# Patient Record
Sex: Female | Born: 1950
Health system: Southern US, Community
[De-identification: ages and names within clinical notes are randomized; demographics above are authoritative.]

## PROBLEM LIST (undated history)

## (undated) DIAGNOSIS — I219 Acute myocardial infarction, unspecified: Secondary | ICD-10-CM

## (undated) DIAGNOSIS — I2510 Atherosclerotic heart disease of native coronary artery without angina pectoris: Secondary | ICD-10-CM

## (undated) DIAGNOSIS — M549 Dorsalgia, unspecified: Secondary | ICD-10-CM

## (undated) DIAGNOSIS — I10 Essential (primary) hypertension: Secondary | ICD-10-CM

## (undated) DIAGNOSIS — I639 Cerebral infarction, unspecified: Secondary | ICD-10-CM

## (undated) DIAGNOSIS — I209 Angina pectoris, unspecified: Secondary | ICD-10-CM

## (undated) DIAGNOSIS — E119 Type 2 diabetes mellitus without complications: Secondary | ICD-10-CM

## (undated) DIAGNOSIS — Z720 Tobacco use: Secondary | ICD-10-CM

## (undated) DIAGNOSIS — M1990 Unspecified osteoarthritis, unspecified site: Secondary | ICD-10-CM

## (undated) DIAGNOSIS — G8929 Other chronic pain: Secondary | ICD-10-CM

## (undated) HISTORY — PX: APPENDECTOMY: SHX54

## (undated) HISTORY — PX: ANKLE SURGERY: SHX546

## (undated) HISTORY — PX: OTHER SURGICAL HISTORY: SHX169

## (undated) HISTORY — PX: ABDOMINAL HYSTERECTOMY: SHX81

---

## 2008-01-03 ENCOUNTER — Ambulatory Visit (HOSPITAL_COMMUNITY): Admission: RE | Admit: 2008-01-03 | Discharge: 2008-01-03 | Payer: Self-pay | Admitting: Neurosurgery

## 2013-09-08 DIAGNOSIS — I219 Acute myocardial infarction, unspecified: Secondary | ICD-10-CM

## 2013-09-08 HISTORY — DX: Acute myocardial infarction, unspecified: I21.9

## 2013-09-25 ENCOUNTER — Encounter (HOSPITAL_COMMUNITY): Payer: Self-pay | Admitting: Cardiovascular Disease

## 2013-09-25 ENCOUNTER — Encounter (HOSPITAL_COMMUNITY)
Admission: AD | Disposition: A | Discharge status: Home / Self Care | Payer: Medicare Other | Source: Other Acute Inpatient Hospital | Attending: Cardiovascular Disease

## 2013-09-25 ENCOUNTER — Inpatient Hospital Stay (HOSPITAL_COMMUNITY)
Admission: AD | Admit: 2013-09-25 | Discharge: 2013-09-26 | DRG: 247 | Disposition: A | Discharge status: Home / Self Care | Payer: Medicare Other | Source: Other Acute Inpatient Hospital | Attending: Cardiovascular Disease | Admitting: Cardiovascular Disease

## 2013-09-25 DIAGNOSIS — E119 Type 2 diabetes mellitus without complications: Secondary | ICD-10-CM | POA: Diagnosis present

## 2013-09-25 DIAGNOSIS — I2510 Atherosclerotic heart disease of native coronary artery without angina pectoris: Secondary | ICD-10-CM | POA: Diagnosis present

## 2013-09-25 DIAGNOSIS — I214 Non-ST elevation (NSTEMI) myocardial infarction: Secondary | ICD-10-CM

## 2013-09-25 DIAGNOSIS — Z720 Tobacco use: Secondary | ICD-10-CM | POA: Diagnosis present

## 2013-09-25 DIAGNOSIS — F17200 Nicotine dependence, unspecified, uncomplicated: Secondary | ICD-10-CM | POA: Diagnosis present

## 2013-09-25 DIAGNOSIS — I10 Essential (primary) hypertension: Secondary | ICD-10-CM | POA: Diagnosis present

## 2013-09-25 DIAGNOSIS — I209 Angina pectoris, unspecified: Secondary | ICD-10-CM

## 2013-09-25 DIAGNOSIS — Z955 Presence of coronary angioplasty implant and graft: Secondary | ICD-10-CM

## 2013-09-25 HISTORY — DX: Angina pectoris, unspecified: I20.9

## 2013-09-25 HISTORY — DX: Acute myocardial infarction, unspecified: I21.9

## 2013-09-25 HISTORY — DX: Unspecified osteoarthritis, unspecified site: M19.90

## 2013-09-25 HISTORY — DX: Atherosclerotic heart disease of native coronary artery without angina pectoris: I25.10

## 2013-09-25 HISTORY — DX: Type 2 diabetes mellitus without complications: E11.9

## 2013-09-25 HISTORY — PX: LEFT HEART CATHETERIZATION WITH CORONARY ANGIOGRAM: SHX5451

## 2013-09-25 HISTORY — PX: CORONARY ANGIOPLASTY: SHX604

## 2013-09-25 HISTORY — DX: Tobacco use: Z72.0

## 2013-09-25 HISTORY — DX: Essential (primary) hypertension: I10

## 2013-09-25 LAB — GLUCOSE, CAPILLARY
Glucose-Capillary: 103 mg/dL — ABNORMAL HIGH (ref 70–99)
Glucose-Capillary: 203 mg/dL — ABNORMAL HIGH (ref 70–99)

## 2013-09-25 LAB — POCT ACTIVATED CLOTTING TIME: Activated Clotting Time: 675 seconds

## 2013-09-25 LAB — MRSA PCR SCREENING: MRSA by PCR: NEGATIVE

## 2013-09-25 SURGERY — LEFT HEART CATHETERIZATION WITH CORONARY ANGIOGRAM
Anesthesia: LOCAL

## 2013-09-25 MED ORDER — HEPARIN (PORCINE) IN NACL 2-0.9 UNIT/ML-% IJ SOLN
INTRAMUSCULAR | Status: AC
  Filled 2013-09-25: qty 1000

## 2013-09-25 MED ORDER — SODIUM CHLORIDE 0.9 % IV SOLN: INTRAVENOUS | Status: AC

## 2013-09-25 MED ORDER — TICAGRELOR 90 MG PO TABS
90.0000 mg | ORAL_TABLET | Freq: Two times a day (BID) | ORAL | Status: DC
  Administered 2013-09-26: 90 mg via ORAL
  Filled 2013-09-25 (×2): qty 1

## 2013-09-25 MED ORDER — TICAGRELOR 90 MG PO TABS
ORAL_TABLET | ORAL | Status: AC
  Filled 2013-09-25: qty 2

## 2013-09-25 MED ORDER — ATORVASTATIN CALCIUM 80 MG PO TABS
80.0000 mg | ORAL_TABLET | Freq: Every day | ORAL | Status: DC
  Administered 2013-09-25: 80 mg via ORAL
  Filled 2013-09-25 (×2): qty 1

## 2013-09-25 MED ORDER — LIDOCAINE HCL (PF) 1 % IJ SOLN
INTRAMUSCULAR | Status: AC
  Filled 2013-09-25: qty 30

## 2013-09-25 MED ORDER — ASPIRIN 81 MG PO CHEW
81.0000 mg | CHEWABLE_TABLET | Freq: Every day | ORAL | Status: DC
  Administered 2013-09-26: 81 mg via ORAL
  Filled 2013-09-25: qty 1

## 2013-09-25 MED ORDER — FENTANYL CITRATE 0.05 MG/ML IJ SOLN
INTRAMUSCULAR | Status: AC
  Filled 2013-09-25: qty 2

## 2013-09-25 MED ORDER — NITROGLYCERIN 0.2 MG/ML ON CALL CATH LAB
INTRAVENOUS | Status: AC
  Filled 2013-09-25: qty 1

## 2013-09-25 MED ORDER — ACETAMINOPHEN 325 MG PO TABS
650.0000 mg | ORAL_TABLET | ORAL | Status: DC | PRN
  Administered 2013-09-25: 21:00:00 650 mg via ORAL
  Filled 2013-09-25: qty 2

## 2013-09-25 MED ORDER — METOPROLOL TARTRATE 25 MG PO TABS
25.0000 mg | ORAL_TABLET | Freq: Two times a day (BID) | ORAL | Status: DC
  Administered 2013-09-25 – 2013-09-26 (×2): 25 mg via ORAL
  Filled 2013-09-25 (×3): qty 1

## 2013-09-25 MED ORDER — MIDAZOLAM HCL 2 MG/2ML IJ SOLN
INTRAMUSCULAR | Status: AC
  Filled 2013-09-25: qty 2

## 2013-09-25 NOTE — H&P (Signed)
     Patient ID: Rachel Perkins MRN: [id number] DOB/AGE: 10-08-1950 63 y.o. Admit date: 09/25/2013  Primary Care Physician: Monico Blitz Primary Cardiologist: new  HPI: 63 yo female with history of HTN, tobacco abuse transferred from [facility] ED for further management of chest pain/NSTEMI. Chest pain began this am. Described as a heaviness in the center of her chest. Pain now relieved with NTG drip. She is on a heparin drip. Troponin over 1.0. D-dimer 1.1. CTA chest at [facility] with no evidence of PE. No prior cardiac disease. She has smoked for 50 years (1ppd) and continues to smoke. She has diet controlled DM.   Review of systems complete and found to be negative unless listed above   Past Medical History  Diagnosis Date  . HTN (hypertension)   . Tobacco abuse   . DM (diabetes mellitus)     Family History  Problem Relation Age of Onset  . Heart failure Father 72    History   Social History  . Marital Status: Married    Spouse Name: N/A    Number of Children: 2  . Years of Education: N/A   Occupational History  . Biochemist, clinical    Social History Main Topics  . Smoking status: Current Every Day Smoker -- 1.00 packs/day for 50 years    Types: Cigarettes  . Smokeless tobacco: Not on file  . Alcohol Use: No  . Drug Use: No  . Sexual Activity: Not on file   Other Topics Concern  . Not on file   Social History Narrative  . No narrative on file    Past Surgical History  Procedure Laterality Date  . Abdominal hysterectomy    . Appendectomy    . Skin grafts left arm    . Ankle surgery      Allergies: Nuts, codeine, PCN, sulfa  Prior to Admission Meds:  ASA 325 mg Qdaily HCTZ 25 mg po Qdaily  Physical Exam: 156/80  RR 12 P 83    General: Well developed, well nourished, NAD  HEENT: OP clear, mucus membranes moist  SKIN: warm, dry. No rashes.  Neuro: No focal deficits  Musculoskeletal: Muscle strength 5/5 all ext  Psychiatric: Mood and affect normal   Neck: No JVD, no carotid bruits, no thyromegaly, no lymphadenopathy.  Lungs:Clear bilaterally, no wheezes, rhonci, crackles  Cardiovascular: Regular rate and rhythm. No murmurs, gallops or rubs.  Abdomen:Soft. Bowel sounds present. Non-tender.  Extremities: No lower extremity edema. Pulses are 2 + in the bilateral DP/PT.  Labs: creatinine: 0.96. Na: 136, D-dimer: 1.14, Hbg 15.2, Plt: 217   Radiology: Chest x-ray: Hyperexpanded lungs, Mild emphysema.  EKG: Sinus, rate 85. No ischemic changes.  ASSESSMENT AND PLAN:   1. NSTEMI: Pt has a history of HTN and ongoing tobacco abuse. Class IV angina with elevated troponin. Recurrent chest pain today now resolved on IV NTG. Plan cardiac cath this afternoon with probable PCI.   2. Tobacco cessation: Smoking cessation advised.   3. DM: Diet controlled.   4. HTN: BP controlled.   Darlina Guys, MD 09/25/2013, 4:37 PM

## 2013-09-25 NOTE — CV Procedure (Signed)
     Cardiac Catheterization Operative Report  Rachel Perkins [id number] 5/18/20156:34 PM [facility], MD  Procedure Performed:  1. Left Heart Catheterization 2. Selective Coronary Angiography 3. Left ventricular angiogram 4. PTCA/DES x 1 mid Circumflex 5. PTCA/DES x 1 proximal RCA  Operator: Lauree Chandler, MD  Access: Right radial artery.   Indication: 63 yo female with history of DM, HTN, tobacco abuse admitted with NSTEMI.                                       Procedure Details: The risks, benefits, complications, treatment options, and expected outcomes were discussed with the patient. The patient and/or family concurred with the proposed plan, giving informed consent. The patient was brought to the cath lab after IV hydration was begun and oral premedication was given. The patient was further sedated with Versed and Fentanyl. The right wrist was prepped and draped in the usual manner. Using the modified Seldinger access technique, a 5/6 French sheath was placed in the right radial artery. 3 mg Verapamil was given through the sheath. 4000 Units IV heparin was given.  Standard diagnostic catheters were used to perform selective coronary angiography. A pigtail catheter was used to perform a left ventricular angiogram. She was found to have a severe stenosis in the proximal RCA (ulcerated plaque) and severe stenosis mid Circumflex. I elected to proceed to PCI of both the RCA and the Circumflex.   PCI Note: The patient was given an additional 5000 Units IV heparin. ACT was over 400. She was given Brilinta 180 mg po x 1.   Lesion #1 (mid Circumflex): Cougar IC wire down Circumflex. 2.0 x 12 mm balloon x 1. 2.25 x 12 mm Resolute DES x 1. Post-dilated with a 2.5 x 8 mm [place] balloon x 1. Stenosis taken from 95% down to 0%.   Lesion #2 (proximal RCA): Cougar IC wire down RCA. Direct stent placement with 3.0 x 15 mm Resolute Integrity DES. Post-dilated with a 3.5 x 12 mm [place] balloon x 1.  Stenosis from 80% down to 0%. Of note, this was an ulcerated plaque.   There were no immediate complications. The patient was taken to the recovery area in stable condition.   Hemodynamic Findings: Central aortic pressure: 129/63 Left ventricular pressure: 119/7/16  Angiographic Findings:  Left main: No obstructive disease.   Left Anterior Descending Artery: Large caliber vessel that courses to the apex. No obstructive disease. Moderate caliber diagonal branch with no obstructive disease.   Circumflex Artery: Large caliber vessel with three small to moderate caliber OM branches. Mid AV groove Circumflex with 95% stenosis. TIMI-2 flow into small caliber distal OM branch.   Right Coronary Artery: Large dominant vessel with 80% ulcerated proximal stenosis. 40% mid stenosis. Mild disease distal vessel.   Left Ventricular Angiogram: LVEF=60%.   Impression: 1. NSTEMI 2. Severe stenosis mid Circumflex now s/p successful PTCA/DES x 1 mid Circumflex 3. Severe stenosis proximal RCA (appears ulcerated) now s/p successful PTCA/DES x 1 proximal RCA 4. Normal LV function  Recommendations: ASA and Brilinta x 1 year. Start statin and beta blocker.        Complications:  None. The patient tolerated the procedure well.

## 2013-09-25 NOTE — Interval H&P Note (Signed)
History and Physical Interval Note:  09/25/2013 4:41 PM  Rachel Perkins  has presented today for surgery, with the diagnosis of CP  The various methods of treatment have been discussed with the patient and family. After consideration of risks, benefits and other options for treatment, the patient has consented to  Procedure(s): LEFT HEART CATHETERIZATION WITH CORONARY ANGIOGRAM (N/A) as a surgical intervention .  The patient's history has been reviewed, patient examined, no change in status, stable for surgery.  I have reviewed the patient's chart and labs.  Questions were answered to the patient's satisfaction.    Cath Lab Visit (complete for each Cath Lab visit)  Clinical Evaluation Leading to the Procedure:   ACS: yes  Non-ACS:    Anginal Classification: CCS IV  Anti-ischemic medical therapy: No Therapy  Non-Invasive Test Results: No non-invasive testing performed  Prior CABG: No previous CABG        Burnell Blanks

## 2013-09-26 ENCOUNTER — Encounter (HOSPITAL_COMMUNITY): Payer: Self-pay | Admitting: Physician Assistant

## 2013-09-26 LAB — BASIC METABOLIC PANEL
BUN: 15 mg/dL (ref 6–23)
CO2: 23 mEq/L (ref 19–32)
Calcium: 8.8 mg/dL (ref 8.4–10.5)
Chloride: 106 mEq/L (ref 96–112)
Creatinine, Ser: 0.83 mg/dL (ref 0.50–1.10)
GFR calc Af Amer: 84 mL/min — ABNORMAL LOW (ref >90)
GFR calc non Af Amer: 72 mL/min — ABNORMAL LOW (ref >90)
Glucose, Bld: 210 mg/dL — ABNORMAL HIGH (ref 70–99)
Potassium: 3.7 mEq/L (ref 3.7–5.3)
Sodium: 142 mEq/L (ref 137–147)

## 2013-09-26 LAB — GLUCOSE, CAPILLARY: Glucose-Capillary: 159 mg/dL — ABNORMAL HIGH (ref 70–99)

## 2013-09-26 LAB — CBC
HCT: 39.9 % (ref 36.0–46.0)
Hemoglobin: 13.8 g/dL (ref 12.0–15.0)
MCH: 31.5 pg (ref 26.0–34.0)
MCHC: 34.6 g/dL (ref 30.0–36.0)
MCV: 91.1 fL (ref 78.0–100.0)
Platelets: 222 10*3/uL (ref 150–400)
RBC: 4.38 MIL/uL (ref 3.87–5.11)
RDW: 13.8 % (ref 11.5–15.5)
WBC: 7.7 10*3/uL (ref 4.0–10.5)

## 2013-09-26 MED ORDER — NITROGLYCERIN 0.4 MG SL SUBL
0.4000 mg | SUBLINGUAL_TABLET | SUBLINGUAL | Status: DC | PRN
Start: 2013-09-26 — End: 2016-01-24

## 2013-09-26 MED ORDER — NICOTINE 7 MG/24HR TD PT24: 7.0000 mg | MEDICATED_PATCH | Freq: Every day | TRANSDERMAL | Status: DC

## 2013-09-26 MED ORDER — TICAGRELOR 90 MG PO TABS: 90.0000 mg | ORAL_TABLET | Freq: Two times a day (BID) | ORAL | Status: DC

## 2013-09-26 MED ORDER — NICOTINE 7 MG/24HR TD PT24
7.0000 mg | MEDICATED_PATCH | Freq: Every day | TRANSDERMAL | Status: DC
  Administered 2013-09-26: 10:00:00 7 mg via TRANSDERMAL
  Filled 2013-09-26: qty 1

## 2013-09-26 MED ORDER — ATORVASTATIN CALCIUM 80 MG PO TABS: 80.0000 mg | ORAL_TABLET | Freq: Every day | ORAL | Status: DC

## 2013-09-26 MED ORDER — METOPROLOL TARTRATE 25 MG PO TABS: 25.0000 mg | ORAL_TABLET | Freq: Two times a day (BID) | ORAL | Status: DC

## 2013-09-26 MED ORDER — NITROGLYCERIN 0.4 MG SL SUBL: 0.4000 mg | SUBLINGUAL_TABLET | SUBLINGUAL | Status: DC | PRN

## 2013-09-26 MED ORDER — ASPIRIN 81 MG PO CHEW: 81.0000 mg | CHEWABLE_TABLET | Freq: Every day | ORAL | Status: DC

## 2013-09-26 MED FILL — Nitroglycerin IV Soln 200 MCG/ML in D5W: INTRAVENOUS | Qty: 250 | Status: AC

## 2013-09-26 MED FILL — Heparin Sodium (Porcine) 100 Unt/ML in Sodium Chloride 0.45%: INTRAMUSCULAR | Qty: 250 | Status: AC

## 2013-09-26 NOTE — Progress Notes (Signed)
Patient Name: Rachel Perkins Date of Encounter: 09/26/2013     Active Problems:   NSTEMI (non-ST elevated myocardial infarction)   Coronary atherosclerosis of native coronary artery   HTN (hypertension)   Diabetes mellitus   Tobacco abuse    SUBJECTIVE  Will follow up in [place]. She wants nicotine patches for smoking cessation. No CP or SOB  CURRENT MEDS . aspirin  81 mg Oral Daily  . atorvastatin  80 mg Oral q1800  . metoprolol tartrate  25 mg Oral BID  . ticagrelor  90 mg Oral BID    OBJECTIVE  Filed Vitals:   09/25/13 2000 09/25/13 2100 09/26/13 0035 09/26/13 0631  BP: 120/56 126/61 113/52 117/75  Pulse: 72 74 75 82  Temp:  97.7 F (36.5 C) 98.3 F (36.8 C) 98.1 F (36.7 C)  TempSrc:  Oral Oral Oral  Resp:  18 18 20   Weight:   149 lb 11.1 oz (67.9 kg)   SpO2: 99% 99% 100% 100%    Intake/Output Summary (Last 24 hours) at 09/26/13 0651 Last data filed at 09/25/13 2300  Gross per 24 hour  Intake    300 ml  Output      0 ml  Net    300 ml   Filed Weights   09/26/13 0035  Weight: 149 lb 11.1 oz (67.9 kg)    PHYSICAL EXAM  General: Pleasant, NAD. Neuro: Alert and oriented X 3. Moves all extremities spontaneously. Psych: Normal affect. HEENT:  Normal  Neck: Supple without bruits or JVD. Lungs:  Resp regular and unlabored, CTA. Heart: RRR no s3, s4, or murmurs. Abdomen: Soft, non-tender, non-distended, BS + x 4.  Extremities: No clubbing, cyanosis or edema. DP/PT/Radials 2+ and equal bilaterally. No hematoma right wrist.  Accessory Clinical Findings  CBC  Recent Labs  09/26/13 0434  WBC 7.7  HGB 13.8  HCT 39.9  MCV 91.1  PLT 347   Basic Metabolic Panel  Recent Labs  09/26/13 0434  NA 142  K 3.7  CL 106  CO2 23  GLUCOSE 210*  BUN 15  CREATININE 0.83  CALCIUM 8.8   TELE  NSR with PVCs  Radiology/Studies  Chest x-ray: Hyperexpanded lungs, Mild emphysema.   ASSESSMENT AND PLAN  Rachel Perkins is a 63 y.o. female with a  history of diet controlled DM, HTN and tobacco abuse who was transferred from [facility] ED on 09/25/13 for further management of chest pain/NSTEMI. She was taken for cardiac cath that day.   NSTEMI- troponin >1 at [facility]. -- LHC w/ severe stenosis mid Circumflex now s/p successful PTCA/DES to mLCx, severe stenosis proximal RCA (appears ulcerated) now s/p successful PTCA/DES pRCA  -- She had normal LV function EF 60%.  -- Continue on ASA and Brilinta x 1 year as well as statin and beta blocker  Tobacco abuse- wants to quit. nicotine patches at discharge.   DM: Diet controlled.   D-dimer 1.1- CTA chest at [facility] with no evidence of PE  HTN: BP controlled   Signed, Perry Mount PA-C  Pager [phone]  Patient seen and examined and history reviewed. Agree with above findings and plan. Feels very well. No recurrent chest pain. Right wrist without hematoma. Labs stable. Ecg shows mild T wave inversion V3-4. Plan DC home today. She does a lot of lifting at work so I have recommended she stay out of work until cleared to return on her follow up visit. Plan follow up in our [place] office. Smoking  cessation encouraged and pt plans to stop with help of nicotine patch. Stressed importance of compliance with DAPT for one year.   Ander Slade [facility] 09/26/2013 7:28 AM

## 2013-09-26 NOTE — Discharge Summary (Signed)
Discharge Summary   Patient ID: Rachel Perkins MRN: [id number], DOB/AGE: 1950/08/31 63 y.o. Admit date: 09/25/2013 D/C date:     09/26/2013  Primary Cardiologist: New, will follow in [place] with Dr. Bronson Ing  Principal Problem:   NSTEMI (non-ST elevated myocardial infarction) Active Problems:   Coronary atherosclerosis of native coronary artery   HTN (hypertension)   Diabetes mellitus   Tobacco abuse   Admission Dates: 09/25/13-09/26/13 Discharge Diagnosis: NSTEMI s/p PCI with DES to [facility] and DES to pRCA on 09/25/13  HPI: Rachel Perkins is a 63 y.o. female with a history of diet controlled DM, HTN and ongoing tobacco abuse who was transferred from [facility] ED on 09/25/13 for further management of chest pain/NSTEMI. She was taken for cardiac cath at [facility] that day.   Hospital Course  NSTEMI- troponin >1 at [facility].  -- LHC on 09/25/13 w/ severe stenosis mid Circumflex, now s/p successful PTCA/DES to mLCx and severe stenosis of proximal RCA (appears ulcerated) now s/p successful PTCA/DES pRCA. She had normal LV function EF 60%. She will continue on ASA and Brilinta x 1 year as well as statin and beta blocker.  Tobacco abuse- She has smoked for 50 years (1ppd) and is now determined to quit. She requested nicotine patches at discharge.   D-dimer 1.1- CTA chest at [facility] with no evidence of PE.  HTN: BP controlled   DM: Diet controlled.   The patient has had an uncomplicated hospital course and is recovering well. The radial catheter site is stable. She has been seen by Dr. Martinique today and deemed ready for discharge home. All follow-up appointments have been scheduled. Smoking cessation was disscussed in length and she was provided with nicotine patches. A 30 day free supply of Brilinta was provided for the patient. She does a lot of lifting at work so she was advised to stay out of work until cleared to return on her follow up visit. A work excuse note was provided as well.  Discharge medications include ASA/Brilinta, metoprolol tartrate 25mg  BID, atorvastatin 80mg , HCTZ 25mg , SL NTG. She has been advised to discontinue her Celebrex for now.    Discharge Vitals: Blood pressure 112/70, pulse 70, temperature 98.2 F (36.8 C), temperature source Oral, resp. rate 22, weight 149 lb 11.1 oz (67.9 kg), SpO2 98.00%.  Labs: Lab Results  Component Value Date   WBC 7.7 09/26/2013   HGB 13.8 09/26/2013   HCT 39.9 09/26/2013   MCV 91.1 09/26/2013   PLT 222 09/26/2013     Recent Labs Lab 09/26/13 0434  NA 142  K 3.7  CL 106  CO2 23  BUN 15  CREATININE 0.83  CALCIUM 8.8  GLUCOSE 210*    Diagnostic Studies/Procedures   Chest x-ray: Hyperexpanded lungs, Mild emphysema.   Cardiac Catheterization Operative Report  5/18/20156:34 PM  Procedure Performed:  1. Left Heart Catheterization 2. Selective Coronary Angiography 3. Left ventricular angiogram 4. PTCA/DES x 1 mid Circumflex 5. PTCA/DES x 1 proximal RCA Operator: Lauree Chandler, MD  Access: Right radial artery.  Indication: 63 yo female with history of DM, HTN, tobacco abuse admitted with NSTEMI.  Procedure Details:  The risks, benefits, complications, treatment options, and expected outcomes were discussed with the patient. The patient and/or family concurred with the proposed plan, giving informed consent. The patient was brought to the cath lab after IV hydration was begun and oral premedication was given. The patient was further sedated with Versed and Fentanyl. The right wrist was prepped  and draped in the usual manner. Using the modified Seldinger access technique, a 5/6 French sheath was placed in the right radial artery. 3 mg Verapamil was given through the sheath. 4000 Units IV heparin was given. Standard diagnostic catheters were used to perform selective coronary angiography. A pigtail catheter was used to perform a left ventricular angiogram. She was found to have a severe stenosis in the  proximal RCA (ulcerated plaque) and severe stenosis mid Circumflex. I elected to proceed to PCI of both the RCA and the Circumflex.  PCI Note: The patient was given an additional 5000 Units IV heparin. ACT was over 400. She was given Brilinta 180 mg po x 1.  Lesion #1 (mid Circumflex): Cougar IC wire down Circumflex. 2.0 x 12 mm balloon x 1. 2.25 x 12 mm Resolute DES x 1. Post-dilated with a 2.5 x 8 mm [place] balloon x 1. Stenosis taken from 95% down to 0%.  Lesion #2 (proximal RCA): Cougar IC wire down RCA. Direct stent placement with 3.0 x 15 mm Resolute Integrity DES. Post-dilated with a 3.5 x 12 mm [place] balloon x 1. Stenosis from 80% down to 0%. Of note, this was an ulcerated plaque.  There were no immediate complications. The patient was taken to the recovery area in stable condition.  Hemodynamic Findings:  Central aortic pressure: 129/63  Left ventricular pressure: 119/7/16  Angiographic Findings:  Left main: No obstructive disease.  Left Anterior Descending Artery: Large caliber vessel that courses to the apex. No obstructive disease. Moderate caliber diagonal branch with no obstructive disease.  Circumflex Artery: Large caliber vessel with three small to moderate caliber OM branches. Mid AV groove Circumflex with 95% stenosis. TIMI-2 flow into small caliber distal OM branch.  Right Coronary Artery: Large dominant vessel with 80% ulcerated proximal stenosis. 40% mid stenosis. Mild disease distal vessel.  Left Ventricular Angiogram: LVEF=60%.  Impression:  1. NSTEMI  2. Severe stenosis mid Circumflex now s/p successful PTCA/DES x 1 mid Circumflex  3. Severe stenosis proximal RCA (appears ulcerated) now s/p successful PTCA/DES x 1 proximal RCA  4. Normal LV function  Recommendations: ASA and Brilinta x 1 year. Start statin and beta blocker.  Complications: None. The patient tolerated the procedure well.    Discharge Medications     Medication List    STOP taking these medications        celecoxib 100 MG capsule  Commonly known as:  CELEBREX      TAKE these medications       aspirin 81 MG chewable tablet  Chew 1 tablet (81 mg total) by mouth daily.     atorvastatin 80 MG tablet  Commonly known as:  LIPITOR  Take 1 tablet (80 mg total) by mouth daily at 6 PM.     EPIPEN 2-PAK 0.3 mg/0.3 mL Soaj injection  Generic drug:  EPINEPHrine  Inject 0.3 mg into the muscle once.     hydrochlorothiazide 25 MG tablet  Commonly known as:  HYDRODIURIL  Take 25 mg by mouth daily.     loratadine 10 MG tablet  Commonly known as:  CLARITIN  Take 10 mg by mouth daily.     metoprolol tartrate 25 MG tablet  Commonly known as:  LOPRESSOR  Take 1 tablet (25 mg total) by mouth 2 (two) times daily.     mometasone 50 MCG/ACT nasal spray  Commonly known as:  NASONEX  Place 1 spray into the nose daily as needed (allergies).     nicotine 7  mg/24hr patch  Commonly known as:  NICODERM CQ - dosed in mg/24 hr  Place 1 patch (7 mg total) onto the skin daily.     ticagrelor 90 MG Tabs tablet  Commonly known as:  BRILINTA  Take 1 tablet (90 mg total) by mouth 2 (two) times daily.        Disposition   The patient will be discharged in stable condition to home.  Follow-up Information   Follow up with Herminio Commons, MD On 10/03/2013. (@ 9:40 am)    Specialty:  Cardiology   Contact information:   [address] [place] [address] [phone]         Duration of Discharge Encounter: Greater than 30 minutes including physician and PA time.  Signed, Perry Mount PA-C 09/26/2013, 8:05 AM

## 2013-09-26 NOTE — Discharge Instructions (Signed)

## 2013-09-26 NOTE — Care Management Note (Signed)
    Page 1 of 1   09/26/2013     11:46:57 AM CARE MANAGEMENT NOTE 09/26/2013  Patient:  Rachel Perkins, Rachel Perkins   Account Number:  [id number]  Date Initiated:  09/26/2013  Documentation initiated by:  Fuller Mandril  Subjective/Objective Assessment:   63 yo female with history of HTN, tobacco abuse transferred from [facility] ED for further management of chest pain/NSTEMI.// Home with spouse.     Action/Plan:   Procedure Performed:  Left Heart Catheterization  Selective Coronary Angiography  Left ventricular angiogram  PTCA/DES x 1 mid Circumflex  PTCA/DES x 1 proximal RCA.// Benefits check for Brilinta   Anticipated DC Date:  09/26/2013   Anticipated DC Plan:  [place]  CM consult  Medication Assistance      Choice offered to / List presented to:             Status of service:  Completed, signed off Medicare Important Message given?   (If response is "NO", the following Medicare IM given date fields will be blank) Date Medicare IM given:   Date Additional Medicare IM given:    Discharge Disposition:    Per UR Regulation:  Reviewed for med. necessity/level of care/duration of stay  If discussed at [place] of Stay Meetings, dates discussed:    Comments:  09/26/13 0900 Lyanna Blystone J. Clydene Laming, RN, BSN, [place] [phone] RN spoke with pt at bedside regarding benefits check for Brilinta.  Pt has brochure with 30 day free card and refill assistance card intact.  Pt utilizes [facility] in [place], [place] for prescription needs.  NCM and RN called pharmacy to confirm availability of medication. Information relayed to pt.  Pt verbalizes importance of filling medication upon discharge.

## 2013-09-26 NOTE — Discharge Summary (Signed)
Patient seen and examined and history reviewed. Agree with above findings and plan. See my earlier rounding note.  Ander Slade [facility] 09/26/2013 8:17 AM

## 2013-09-26 NOTE — Progress Notes (Signed)
CARDIAC REHAB PHASE I   PRE:  Rate/Rhythm: 78 SR  BP:  Supine:   Sitting: 124/74  Standing:    SaO2:   MODE:  Ambulation: 1000 ft   POST:  Rate/Rhythm: 83 SR  BP:  Supine:   Sitting: 120/70  Standing:    SaO2:  0750-0850 Pt walked 1000 ft with fast pace. No CP but slight soreness. Education completed with pt and family. Understanding voiced. Discussed smoking cessation and handouts given. Encouraged pt to call 1800quitnow as needed. Pt is going to try nicotine patches. Discussed CRP 2 and pt gave permission to refer to [facility] Phase 2. Gave diabetic and heart healthy diets.    Graylon Good, RN BSN  09/26/2013 8:47 AM

## 2013-09-27 ENCOUNTER — Telehealth: Payer: Self-pay | Admitting: *Deleted

## 2013-09-27 NOTE — Telephone Encounter (Signed)
Husband request something to help wife sleep.  Advised that she may try OTC Benadryl 25mg  at bedtime.  If need anything stronger, should contact PMD as our providers do not typically prescribe these types of medications.  Post hospital visit scheduled for 10/03/2013 with Dr. Bronson Ing.  Husband verbalized understanding.

## 2013-10-03 ENCOUNTER — Encounter: Payer: Self-pay | Admitting: *Deleted

## 2013-10-03 ENCOUNTER — Ambulatory Visit (INDEPENDENT_AMBULATORY_CARE_PROVIDER_SITE_OTHER): Payer: Medicare Other | Admitting: Cardiovascular Disease

## 2013-10-03 ENCOUNTER — Encounter: Payer: Self-pay | Admitting: Cardiovascular Disease

## 2013-10-03 VITALS — BP 111/75 | HR 66 | Ht 63.0 in | Wt 147.0 lb

## 2013-10-03 DIAGNOSIS — Z7182 Exercise counseling: Secondary | ICD-10-CM

## 2013-10-03 DIAGNOSIS — I214 Non-ST elevation (NSTEMI) myocardial infarction: Secondary | ICD-10-CM

## 2013-10-03 DIAGNOSIS — E785 Hyperlipidemia, unspecified: Secondary | ICD-10-CM

## 2013-10-03 DIAGNOSIS — Z9861 Coronary angioplasty status: Secondary | ICD-10-CM

## 2013-10-03 DIAGNOSIS — Z955 Presence of coronary angioplasty implant and graft: Secondary | ICD-10-CM

## 2013-10-03 DIAGNOSIS — I10 Essential (primary) hypertension: Secondary | ICD-10-CM

## 2013-10-03 DIAGNOSIS — I2510 Atherosclerotic heart disease of native coronary artery without angina pectoris: Secondary | ICD-10-CM

## 2013-10-03 NOTE — Patient Instructions (Signed)
   Referral to Cardiac Rehab Continue all current medications. Work note provided  Follow up in  3-4 months

## 2013-10-03 NOTE — Progress Notes (Signed)
Patient ID: Rachel Perkins, female   DOB: Aug 27, 1950, 63 y.o.   MRN: [id number]      SUBJECTIVE: The patient is a 63 year old woman with a recent non-STEMI for which she underwent drug-eluting stent placement to the mid left circumflex and proximal right coronary arteries. Left ventriculography demonstrated normal left ventricular systolic function. She has a history of diet controlled diabetes mellitus, hypertension, and tobacco abuse. She is now taking Brilinta. She has since quit smoking. She has had some mild chest and left shoulder pains but it has been nothing like what she experienced prior to her MI. She does feel slightly more sluggish than she used to. She would like to start working again. She works part-time at [employer] in 4 or 5 hour shifts. She used to lift up to 45 pounds at work but says she will no longer do this. She says she has been compliant with all her medications.    Allergies  Allergen Reactions  . Codeine Itching  . Other     NUTS ?LOBSTER  . Penicillins Rash  . Sulfa Antibiotics Rash    Current Outpatient Prescriptions  Medication Sig Dispense Refill  . aspirin 81 MG chewable tablet Chew 1 tablet (81 mg total) by mouth daily.      Marland Kitchen atorvastatin (LIPITOR) 80 MG tablet Take 1 tablet (80 mg total) by mouth daily at 6 PM.  30 tablet  11  . clonazePAM (KLONOPIN) 0.5 MG tablet Take 1 tablet by mouth at bedtime.      Marland Kitchen EPIPEN 2-PAK 0.3 MG/0.3ML SOAJ injection Inject 0.3 mg into the muscle once.       . hydrochlorothiazide (HYDRODIURIL) 25 MG tablet Take 25 mg by mouth daily.       . metoprolol tartrate (LOPRESSOR) 25 MG tablet Take 1 tablet (25 mg total) by mouth 2 (two) times daily.  60 tablet  11  . nicotine (NICODERM CQ - DOSED IN MG/24 HR) 7 mg/24hr patch Place 1 patch (7 mg total) onto the skin daily.  28 patch  0  . nitroGLYCERIN (NITROSTAT) 0.4 MG SL tablet Place 1 tablet (0.4 mg total) under the tongue every 5 (five) minutes as needed for chest pain.  25  tablet  12  . ticagrelor (BRILINTA) 90 MG TABS tablet Take 1 tablet (90 mg total) by mouth 2 (two) times daily.  60 tablet  11   No current facility-administered medications for this visit.    Past Medical History  Diagnosis Date  . HTN (hypertension)   . Tobacco abuse   . DM (diabetes mellitus)     a. diet controlled.   Marland Kitchen CAD (coronary artery disease)     a. 09/25/13 s/p DES to [facility] and DES to pRCA.  Marland Kitchen Myocardial infarction 09/2013    nstemi  . Anginal pain   . Arthritis     Past Surgical History  Procedure Laterality Date  . Abdominal hysterectomy    . Appendectomy    . Skin grafts left arm    . Ankle surgery    . Coronary angioplasty  09/25/2013    des mid circumflex  des rca    History   Social History  . Marital Status: Married    Spouse Name: N/A    Number of Children: 2  . Years of Education: N/A   Occupational History  . Biochemist, clinical    Social History Main Topics  . Smoking status: Current Every Day Smoker -- 1.00 packs/day for 50 years  Types: Cigarettes    Start date: 06/04/1964  . Smokeless tobacco: Never Used     Comment: currently using the nicotine patch/last time she smoked was last Monday 09/25/13  . Alcohol Use: No  . Drug Use: No  . Sexual Activity: Not on file   Other Topics Concern  . Not on file   Social History Narrative  . No narrative on file     Filed Vitals:   10/03/13 0933  BP: 111/75  Pulse: 66  Height: 5\' 3"  (1.6 m)  Weight: 147 lb (66.679 kg)    PHYSICAL EXAM General: NAD Neck: No JVD, no thyromegaly. Lungs: Clear to auscultation bilaterally with normal respiratory effort. CV: Nondisplaced PMI.  Regular rate and rhythm, normal S1/S2, no S3/S4, no murmur. No pretibial or periankle edema.  No carotid bruit.  Normal pedal pulses.  Abdomen: Soft, nontender, no hepatosplenomegaly, no distention.  Neurologic: Alert and oriented x 3.  Psych: Normal affect. Extremities: No clubbing or cyanosis.   ECG: reviewed and  available in electronic records.      ASSESSMENT AND PLAN: 1. CAD s/p NSTEMI with DES to mid LCx and prox RCA: Symptomatically stable. Continue aspirin, Lipitor, metoprolol, and Brilinta. I will make a referral to cardiac rate dilatation and strongly encouraged her to participate in this. I will allow her to return to work with no heavy lifting. 2. HTN: Controlled on current therapy which includes HCTZ and metoprolol. 3. Tobacco abuse: She has quit smoking altogether and I encouraged continued cessation. 4. Hyperlipidemia: I will check to see if lipids were checked at [facility].  Dispo: f/u 3-4 months.  Kate Sable, M.D., F.A.C.C.

## 2013-11-06 ENCOUNTER — Encounter (HOSPITAL_COMMUNITY)
Admission: RE | Admit: 2013-11-06 | Discharge: 2013-11-06 | Disposition: A | Discharge status: Home / Self Care | Payer: Medicare Other | Source: Ambulatory Visit | Attending: Cardiovascular Disease | Admitting: Cardiovascular Disease

## 2013-11-06 VITALS — BP 90/70 | HR 73 | Ht 63.0 in | Wt 148.0 lb

## 2013-11-06 DIAGNOSIS — I214 Non-ST elevation (NSTEMI) myocardial infarction: Secondary | ICD-10-CM

## 2013-11-06 DIAGNOSIS — Z9861 Coronary angioplasty status: Secondary | ICD-10-CM

## 2013-11-06 NOTE — Progress Notes (Signed)
Patient referred to CR by Dr. Bronson Ing due to NSTEMI 410.70 and Stent Placement 414.01. During orientation advised patient on arrival and appointment times what to wear, what to do before, during and after exercise. Reviewed attendance and class policy. Talked about inclement weather and class consultation policy. Pt is scheduled to start Cardiac Rehab on 11/13/13 at 2pm. Pt was advised to come to class 5 minutes before class starts. He was also given instructions on meeting with the dietician and attending the Family Structure classes. Pt is eager to get started.

## 2013-11-06 NOTE — Patient Instructions (Signed)
Pt has finished orientation and is scheduled to start CR on 11/13/2013 at 2 pm. Pt has been instructed to arrive to class 15 minutes early for scheduled class. Pt has been instructed to wear comfortable clothing and shoes with rubber soles. Pt has been told to take their medications 1 hour prior to coming to class.  If the patient is not going to attend class, he/she has been instructed to call.

## 2013-11-13 ENCOUNTER — Encounter (HOSPITAL_COMMUNITY)
Admission: RE | Admit: 2013-11-13 | Discharge: 2013-11-13 | Disposition: A | Discharge status: Home / Self Care | Payer: Medicare Other | Source: Ambulatory Visit | Attending: Cardiovascular Disease | Admitting: Cardiovascular Disease

## 2013-11-13 DIAGNOSIS — I214 Non-ST elevation (NSTEMI) myocardial infarction: Secondary | ICD-10-CM | POA: Diagnosis not present

## 2013-11-13 DIAGNOSIS — Z9861 Coronary angioplasty status: Secondary | ICD-10-CM | POA: Insufficient documentation

## 2013-11-15 ENCOUNTER — Encounter (HOSPITAL_COMMUNITY)
Admission: RE | Admit: 2013-11-15 | Discharge: 2013-11-15 | Disposition: A | Discharge status: Home / Self Care | Payer: Medicare Other | Source: Ambulatory Visit | Attending: Cardiovascular Disease | Admitting: Cardiovascular Disease

## 2013-11-15 DIAGNOSIS — I214 Non-ST elevation (NSTEMI) myocardial infarction: Secondary | ICD-10-CM | POA: Diagnosis not present

## 2013-11-17 ENCOUNTER — Encounter (HOSPITAL_COMMUNITY)
Admission: RE | Admit: 2013-11-17 | Discharge: 2013-11-17 | Disposition: A | Discharge status: Home / Self Care | Payer: Medicare Other | Source: Ambulatory Visit | Attending: Cardiovascular Disease | Admitting: Cardiovascular Disease

## 2013-11-17 DIAGNOSIS — I214 Non-ST elevation (NSTEMI) myocardial infarction: Secondary | ICD-10-CM | POA: Diagnosis not present

## 2013-11-20 ENCOUNTER — Encounter (HOSPITAL_COMMUNITY)
Admission: RE | Admit: 2013-11-20 | Discharge: 2013-11-20 | Disposition: A | Discharge status: Home / Self Care | Payer: Medicare Other | Source: Ambulatory Visit | Attending: Cardiovascular Disease | Admitting: Cardiovascular Disease

## 2013-11-20 DIAGNOSIS — I214 Non-ST elevation (NSTEMI) myocardial infarction: Secondary | ICD-10-CM | POA: Diagnosis not present

## 2013-11-22 ENCOUNTER — Encounter (HOSPITAL_COMMUNITY)
Admission: RE | Admit: 2013-11-22 | Discharge: 2013-11-22 | Disposition: A | Discharge status: Home / Self Care | Payer: Medicare Other | Source: Ambulatory Visit | Attending: Cardiovascular Disease | Admitting: Cardiovascular Disease

## 2013-11-22 DIAGNOSIS — I214 Non-ST elevation (NSTEMI) myocardial infarction: Secondary | ICD-10-CM | POA: Diagnosis not present

## 2013-11-24 ENCOUNTER — Encounter (HOSPITAL_COMMUNITY): Payer: Medicare Other

## 2013-11-27 ENCOUNTER — Encounter (HOSPITAL_COMMUNITY)
Admission: RE | Admit: 2013-11-27 | Discharge: 2013-11-27 | Disposition: A | Discharge status: Home / Self Care | Payer: Medicare Other | Source: Ambulatory Visit | Attending: Cardiovascular Disease | Admitting: Cardiovascular Disease

## 2013-11-27 DIAGNOSIS — I214 Non-ST elevation (NSTEMI) myocardial infarction: Secondary | ICD-10-CM | POA: Diagnosis not present

## 2013-11-29 ENCOUNTER — Encounter (HOSPITAL_COMMUNITY)
Admission: RE | Admit: 2013-11-29 | Discharge: 2013-11-29 | Disposition: A | Discharge status: Home / Self Care | Payer: Medicare Other | Source: Ambulatory Visit | Attending: Cardiovascular Disease | Admitting: Cardiovascular Disease

## 2013-11-29 DIAGNOSIS — I214 Non-ST elevation (NSTEMI) myocardial infarction: Secondary | ICD-10-CM | POA: Diagnosis not present

## 2013-12-01 ENCOUNTER — Encounter (HOSPITAL_COMMUNITY): Payer: Medicare Other

## 2013-12-04 ENCOUNTER — Encounter (HOSPITAL_COMMUNITY)
Admission: RE | Admit: 2013-12-04 | Discharge: 2013-12-04 | Disposition: A | Discharge status: Home / Self Care | Payer: Medicare Other | Source: Ambulatory Visit | Attending: Cardiovascular Disease | Admitting: Cardiovascular Disease

## 2013-12-04 DIAGNOSIS — I214 Non-ST elevation (NSTEMI) myocardial infarction: Secondary | ICD-10-CM | POA: Diagnosis not present

## 2013-12-06 ENCOUNTER — Encounter (HOSPITAL_COMMUNITY): Payer: Medicare Other

## 2013-12-08 ENCOUNTER — Encounter (HOSPITAL_COMMUNITY): Payer: Medicare Other

## 2013-12-11 ENCOUNTER — Encounter (HOSPITAL_COMMUNITY)
Admission: RE | Admit: 2013-12-11 | Discharge: 2013-12-11 | Disposition: A | Discharge status: Home / Self Care | Payer: Medicare Other | Source: Ambulatory Visit | Attending: Cardiovascular Disease | Admitting: Cardiovascular Disease

## 2013-12-11 DIAGNOSIS — I2510 Atherosclerotic heart disease of native coronary artery without angina pectoris: Secondary | ICD-10-CM | POA: Insufficient documentation

## 2013-12-11 DIAGNOSIS — I10 Essential (primary) hypertension: Secondary | ICD-10-CM | POA: Diagnosis not present

## 2013-12-11 DIAGNOSIS — Z7902 Long term (current) use of antithrombotics/antiplatelets: Secondary | ICD-10-CM | POA: Diagnosis not present

## 2013-12-11 DIAGNOSIS — F17200 Nicotine dependence, unspecified, uncomplicated: Secondary | ICD-10-CM | POA: Diagnosis not present

## 2013-12-11 DIAGNOSIS — I214 Non-ST elevation (NSTEMI) myocardial infarction: Secondary | ICD-10-CM | POA: Diagnosis not present

## 2013-12-11 DIAGNOSIS — E119 Type 2 diabetes mellitus without complications: Secondary | ICD-10-CM | POA: Diagnosis not present

## 2013-12-11 DIAGNOSIS — Z7982 Long term (current) use of aspirin: Secondary | ICD-10-CM | POA: Insufficient documentation

## 2013-12-11 DIAGNOSIS — Z9861 Coronary angioplasty status: Secondary | ICD-10-CM | POA: Diagnosis not present

## 2013-12-13 ENCOUNTER — Encounter (HOSPITAL_COMMUNITY)
Admission: RE | Admit: 2013-12-13 | Discharge: 2013-12-13 | Disposition: A | Discharge status: Home / Self Care | Payer: Medicare Other | Source: Ambulatory Visit | Attending: Cardiovascular Disease | Admitting: Cardiovascular Disease

## 2013-12-13 DIAGNOSIS — I214 Non-ST elevation (NSTEMI) myocardial infarction: Secondary | ICD-10-CM | POA: Diagnosis not present

## 2013-12-15 ENCOUNTER — Encounter (HOSPITAL_COMMUNITY): Payer: Medicare Other

## 2013-12-18 ENCOUNTER — Encounter (HOSPITAL_COMMUNITY): Payer: Medicare Other

## 2013-12-20 ENCOUNTER — Encounter (HOSPITAL_COMMUNITY): Payer: Medicare Other

## 2013-12-22 ENCOUNTER — Encounter (HOSPITAL_COMMUNITY): Payer: Medicare Other

## 2013-12-25 ENCOUNTER — Encounter (HOSPITAL_COMMUNITY)
Admission: RE | Admit: 2013-12-25 | Discharge: 2013-12-25 | Disposition: A | Discharge status: Home / Self Care | Payer: Medicare Other | Source: Ambulatory Visit | Attending: Cardiovascular Disease | Admitting: Cardiovascular Disease

## 2013-12-25 DIAGNOSIS — I214 Non-ST elevation (NSTEMI) myocardial infarction: Secondary | ICD-10-CM | POA: Diagnosis not present

## 2013-12-27 ENCOUNTER — Encounter (HOSPITAL_COMMUNITY)
Admission: RE | Admit: 2013-12-27 | Discharge: 2013-12-27 | Disposition: A | Discharge status: Home / Self Care | Payer: Medicare Other | Source: Ambulatory Visit | Attending: Cardiovascular Disease | Admitting: Cardiovascular Disease

## 2013-12-27 DIAGNOSIS — I214 Non-ST elevation (NSTEMI) myocardial infarction: Secondary | ICD-10-CM | POA: Diagnosis not present

## 2013-12-29 ENCOUNTER — Encounter (HOSPITAL_COMMUNITY): Payer: Medicare Other

## 2014-01-01 ENCOUNTER — Encounter (HOSPITAL_COMMUNITY)
Admission: RE | Admit: 2014-01-01 | Discharge: 2014-01-01 | Disposition: A | Discharge status: Home / Self Care | Payer: Medicare Other | Source: Ambulatory Visit | Attending: Cardiovascular Disease | Admitting: Cardiovascular Disease

## 2014-01-01 DIAGNOSIS — I214 Non-ST elevation (NSTEMI) myocardial infarction: Secondary | ICD-10-CM | POA: Diagnosis not present

## 2014-01-03 ENCOUNTER — Encounter (HOSPITAL_COMMUNITY): Payer: Medicare Other

## 2014-01-05 ENCOUNTER — Encounter (HOSPITAL_COMMUNITY): Payer: Medicare Other

## 2014-01-08 ENCOUNTER — Encounter (HOSPITAL_COMMUNITY)
Admission: RE | Admit: 2014-01-08 | Discharge: 2014-01-08 | Disposition: A | Discharge status: Home / Self Care | Payer: Medicare Other | Source: Ambulatory Visit | Attending: Cardiovascular Disease | Admitting: Cardiovascular Disease

## 2014-01-08 DIAGNOSIS — I214 Non-ST elevation (NSTEMI) myocardial infarction: Secondary | ICD-10-CM | POA: Diagnosis not present

## 2014-01-10 ENCOUNTER — Encounter (HOSPITAL_COMMUNITY): Payer: Medicare Other

## 2014-01-11 ENCOUNTER — Ambulatory Visit (INDEPENDENT_AMBULATORY_CARE_PROVIDER_SITE_OTHER): Payer: Medicare Other | Admitting: Cardiovascular Disease

## 2014-01-11 ENCOUNTER — Encounter: Payer: Self-pay | Admitting: *Deleted

## 2014-01-11 ENCOUNTER — Encounter: Payer: Self-pay | Admitting: Cardiovascular Disease

## 2014-01-11 VITALS — BP 113/70 | HR 75 | Ht 63.0 in | Wt 142.0 lb

## 2014-01-11 DIAGNOSIS — Z955 Presence of coronary angioplasty implant and graft: Secondary | ICD-10-CM

## 2014-01-11 DIAGNOSIS — Z9861 Coronary angioplasty status: Secondary | ICD-10-CM

## 2014-01-11 DIAGNOSIS — I10 Essential (primary) hypertension: Secondary | ICD-10-CM

## 2014-01-11 DIAGNOSIS — E785 Hyperlipidemia, unspecified: Secondary | ICD-10-CM

## 2014-01-11 DIAGNOSIS — I2510 Atherosclerotic heart disease of native coronary artery without angina pectoris: Secondary | ICD-10-CM

## 2014-01-11 DIAGNOSIS — Z7189 Other specified counseling: Secondary | ICD-10-CM

## 2014-01-11 DIAGNOSIS — I25118 Atherosclerotic heart disease of native coronary artery with other forms of angina pectoris: Secondary | ICD-10-CM

## 2014-01-11 DIAGNOSIS — F17200 Nicotine dependence, unspecified, uncomplicated: Secondary | ICD-10-CM

## 2014-01-11 DIAGNOSIS — Z7182 Exercise counseling: Secondary | ICD-10-CM

## 2014-01-11 DIAGNOSIS — Z716 Tobacco abuse counseling: Secondary | ICD-10-CM

## 2014-01-11 DIAGNOSIS — I209 Angina pectoris, unspecified: Secondary | ICD-10-CM

## 2014-01-11 NOTE — Patient Instructions (Signed)
Continue all current medications. Your physician wants you to follow up in:  4 months.  You will receive a reminder letter in the mail one-two months in advance.  If you don't receive a letter, please call our office to schedule the follow up appointment   

## 2014-01-11 NOTE — Progress Notes (Signed)
Patient ID: REHANNA OLOUGHLIN, female   DOB: 1951/02/02, 63 y.o.   MRN: [id number]      SUBJECTIVE: The patient is a 63 year old woman with a history of non-STEMI in 09/2013 for which she underwent drug-eluting stent placement to the mid left circumflex and proximal right coronary arteries. Left ventriculography demonstrated normal left ventricular systolic function. She has a history of diet controlled diabetes mellitus, hypertension, and tobacco abuse. She is on Brilinta.  She said she feels very well. She had one episode of chest pain in June which lasted 1 hour and was relieved with 2 sublingual nitroglycerin. She denies dyspnea, palpitations, orthopnea, PND and leg swelling. She wants to quit smoking but has not given it up yet. She is really enjoying cardiac rehabilitation. 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   Review of Systems: As per "subjective", otherwise negative.  Allergies  Allergen Reactions  . Codeine Itching  . Other     NUTS ?LOBSTER  . Penicillins Rash  . Sulfa Antibiotics Rash    Current Outpatient Prescriptions  Medication Sig Dispense Refill  . aspirin 81 MG chewable tablet Chew 1 tablet (81 mg total) by mouth daily.      Marland Kitchen atorvastatin (LIPITOR) 80 MG tablet Take 1 tablet (80 mg total) by mouth daily at 6 PM.  30 tablet  11  . clonazePAM (KLONOPIN) 0.5 MG tablet Take 1 tablet by mouth at bedtime.      Marland Kitchen EPIPEN 2-PAK 0.3 MG/0.3ML SOAJ injection Inject 0.3 mg into the muscle once.       . hydrochlorothiazide (HYDRODIURIL) 25 MG tablet Take 25 mg by mouth daily.       . metoprolol tartrate (LOPRESSOR) 25 MG tablet Take 1 tablet (25 mg total) by mouth 2 (two) times daily.  60 tablet  11  . nicotine (NICODERM CQ - DOSED IN MG/24 HR) 7 mg/24hr patch Place 1 patch (7 mg total) onto the skin daily.  28 patch  0  . nitroGLYCERIN (NITROSTAT) 0.4 MG SL tablet Place 1 tablet (0.4 mg total) under the  tongue every 5 (five) minutes as needed for chest pain.  25 tablet  12  . ticagrelor (BRILINTA) 90 MG TABS tablet Take 1 tablet (90 mg total) by mouth 2 (two) times daily.  60 tablet  11   No current facility-administered medications for this visit.    Past Medical History  Diagnosis Date  . HTN (hypertension)   . Tobacco abuse   . DM (diabetes mellitus)     a. diet controlled.   Marland Kitchen CAD (coronary artery disease)     a. 09/25/13 s/p DES to [facility] and DES to pRCA.  Marland Kitchen Myocardial infarction 09/2013    nstemi  . Anginal pain   . Arthritis     Past Surgical History  Procedure Laterality Date  . Abdominal hysterectomy    . Appendectomy    . Skin grafts left arm    . Ankle surgery    . Coronary angioplasty  09/25/2013    des mid circumflex  des rca    History   Social History  . Marital Status: Married    Spouse Name: N/A    Number of Children: 2  . Years of Education: N/A   Occupational History  . Biochemist, clinical    Social History Main Topics  . Smoking status: Current Every Day Smoker -- 1.00 packs/day for 50  years    Types: Cigarettes    Start date: 06/04/1964  . Smokeless tobacco: Never Used     Comment: currently using the nicotine patch/last time she smoked was last Monday 09/25/13  . Alcohol Use: No  . Drug Use: No  . Sexual Activity: Not on file   Other Topics Concern  . Not on file   Social History Narrative  . No narrative on file     Filed Vitals:   01/11/14 1247  Height: 5\' 3"  (1.6 m)   BP 113/70 Pulse 75   PHYSICAL EXAM General: NAD HEENT: Normal. Neck: No JVD, no thyromegaly. Lungs: Clear to auscultation bilaterally with normal respiratory effort. CV: Nondisplaced PMI.  Regular rate and rhythm, normal S1/S2, no S3/S4, no murmur. No pretibial or periankle edema.  No carotid bruit.  Normal pedal pulses.  Abdomen: Soft, nontender, no hepatosplenomegaly, no distention.  Neurologic: Alert and oriented x 3.  Psych: Normal affect. Skin:  Normal. Musculoskeletal: Normal range of motion, no gross deformities. Extremities: No clubbing or cyanosis.   ECG: Most recent ECG reviewed.      ASSESSMENT AND PLAN: 1. CAD s/p NSTEMI with DES to mid LCx and prox RCA: Stable ischemic heart disease. Continue aspirin, Lipitor, metoprolol, and Brilinta. She is participating in cardiac rehabilitation. I will provide a letter (which she has requested) that she can go back to work 40 hrs/week in October. 2. Essential HTN: Controlled on current therapy which includes HCTZ and metoprolol.  3. Tobacco abuse: She has gone back to smoking. Extensive cessation counseling given, along with exercise counseling.  4. Hyperlipidemia: I will check to see if lipids were checked at [facility]. If not, will repeat at next visit.  Dispo: f/u 4 months.   Kate Sable, M.D., F.A.C.C.

## 2014-01-12 ENCOUNTER — Encounter (HOSPITAL_COMMUNITY): Payer: Medicare Other

## 2014-01-15 ENCOUNTER — Encounter (HOSPITAL_COMMUNITY): Payer: Medicare Other

## 2014-01-17 ENCOUNTER — Encounter (HOSPITAL_COMMUNITY)
Admission: RE | Admit: 2014-01-17 | Discharge: 2014-01-17 | Disposition: A | Discharge status: Home / Self Care | Payer: Medicare Other | Source: Ambulatory Visit | Attending: Cardiovascular Disease | Admitting: Cardiovascular Disease

## 2014-01-17 DIAGNOSIS — I214 Non-ST elevation (NSTEMI) myocardial infarction: Secondary | ICD-10-CM | POA: Insufficient documentation

## 2014-01-17 DIAGNOSIS — Z7902 Long term (current) use of antithrombotics/antiplatelets: Secondary | ICD-10-CM | POA: Diagnosis not present

## 2014-01-17 DIAGNOSIS — I10 Essential (primary) hypertension: Secondary | ICD-10-CM | POA: Diagnosis not present

## 2014-01-17 DIAGNOSIS — E119 Type 2 diabetes mellitus without complications: Secondary | ICD-10-CM | POA: Diagnosis not present

## 2014-01-17 DIAGNOSIS — Z9861 Coronary angioplasty status: Secondary | ICD-10-CM | POA: Insufficient documentation

## 2014-01-17 DIAGNOSIS — I2510 Atherosclerotic heart disease of native coronary artery without angina pectoris: Secondary | ICD-10-CM | POA: Insufficient documentation

## 2014-01-17 DIAGNOSIS — Z7982 Long term (current) use of aspirin: Secondary | ICD-10-CM | POA: Diagnosis not present

## 2014-01-17 DIAGNOSIS — F17200 Nicotine dependence, unspecified, uncomplicated: Secondary | ICD-10-CM | POA: Diagnosis not present

## 2014-01-19 ENCOUNTER — Encounter (HOSPITAL_COMMUNITY): Payer: Medicare Other

## 2014-01-22 ENCOUNTER — Encounter (HOSPITAL_COMMUNITY): Payer: Medicare Other

## 2014-01-24 ENCOUNTER — Encounter (HOSPITAL_COMMUNITY)
Admission: RE | Admit: 2014-01-24 | Discharge: 2014-01-24 | Disposition: A | Discharge status: Home / Self Care | Payer: Medicare Other | Source: Ambulatory Visit | Attending: Cardiovascular Disease | Admitting: Cardiovascular Disease

## 2014-01-24 DIAGNOSIS — I214 Non-ST elevation (NSTEMI) myocardial infarction: Secondary | ICD-10-CM | POA: Diagnosis not present

## 2014-01-26 ENCOUNTER — Encounter (HOSPITAL_COMMUNITY): Payer: Medicare Other

## 2014-01-29 ENCOUNTER — Encounter (HOSPITAL_COMMUNITY)
Admission: RE | Admit: 2014-01-29 | Discharge: 2014-01-29 | Disposition: A | Discharge status: Home / Self Care | Payer: Medicare Other | Source: Ambulatory Visit | Attending: Cardiovascular Disease | Admitting: Cardiovascular Disease

## 2014-01-29 DIAGNOSIS — I214 Non-ST elevation (NSTEMI) myocardial infarction: Secondary | ICD-10-CM | POA: Diagnosis not present

## 2014-01-31 ENCOUNTER — Encounter (HOSPITAL_COMMUNITY)
Admission: RE | Admit: 2014-01-31 | Discharge: 2014-01-31 | Disposition: A | Discharge status: Home / Self Care | Payer: Medicare Other | Source: Ambulatory Visit | Attending: Cardiovascular Disease | Admitting: Cardiovascular Disease

## 2014-01-31 DIAGNOSIS — I214 Non-ST elevation (NSTEMI) myocardial infarction: Secondary | ICD-10-CM | POA: Diagnosis not present

## 2014-02-02 ENCOUNTER — Encounter (HOSPITAL_COMMUNITY): Payer: Medicare Other

## 2014-02-05 ENCOUNTER — Encounter (HOSPITAL_COMMUNITY)
Admission: RE | Admit: 2014-02-05 | Discharge: 2014-02-05 | Disposition: A | Discharge status: Home / Self Care | Payer: Medicare Other | Source: Ambulatory Visit | Attending: Cardiovascular Disease | Admitting: Cardiovascular Disease

## 2014-02-05 DIAGNOSIS — I214 Non-ST elevation (NSTEMI) myocardial infarction: Secondary | ICD-10-CM | POA: Diagnosis not present

## 2014-02-07 ENCOUNTER — Encounter (HOSPITAL_COMMUNITY)
Admission: RE | Admit: 2014-02-07 | Discharge: 2014-02-07 | Disposition: A | Discharge status: Home / Self Care | Payer: Medicare Other | Source: Ambulatory Visit | Attending: Cardiovascular Disease | Admitting: Cardiovascular Disease

## 2014-02-07 DIAGNOSIS — I214 Non-ST elevation (NSTEMI) myocardial infarction: Secondary | ICD-10-CM | POA: Diagnosis not present

## 2014-04-19 ENCOUNTER — Encounter (HOSPITAL_COMMUNITY): Payer: Self-pay | Admitting: Cardiovascular Disease

## 2014-05-29 ENCOUNTER — Encounter: Payer: Self-pay | Admitting: Cardiovascular Disease

## 2014-05-29 ENCOUNTER — Ambulatory Visit (INDEPENDENT_AMBULATORY_CARE_PROVIDER_SITE_OTHER): Payer: Medicare Other | Admitting: Cardiovascular Disease

## 2014-05-29 VITALS — BP 129/78 | HR 76 | Ht 63.0 in | Wt 143.0 lb

## 2014-05-29 DIAGNOSIS — I10 Essential (primary) hypertension: Secondary | ICD-10-CM

## 2014-05-29 DIAGNOSIS — Z716 Tobacco abuse counseling: Secondary | ICD-10-CM

## 2014-05-29 DIAGNOSIS — I2510 Atherosclerotic heart disease of native coronary artery without angina pectoris: Secondary | ICD-10-CM

## 2014-05-29 DIAGNOSIS — E785 Hyperlipidemia, unspecified: Secondary | ICD-10-CM

## 2014-05-29 DIAGNOSIS — I252 Old myocardial infarction: Secondary | ICD-10-CM

## 2014-05-29 NOTE — Progress Notes (Signed)
Patient ID: Rachel Perkins, female   DOB: [birth year], 64 y.o.   MRN: [id number]      SUBJECTIVE: The patient is a 64 year old woman with a history of non-STEMI in 09/2013 for which she underwent drug-eluting stent placement to the mid left circumflex and proximal right coronary arteries. Left ventriculography demonstrated normal left ventricular systolic function. She has a history of diet controlled diabetes mellitus, hypertension, and tobacco abuse. She is on Brilinta.  She denies chest pain, leg swelling, palpitations, and exertional dyspnea. She continues to smoke 4 cigarettes daily. She denies bleeding problems.   Review of Systems: As per "subjective", otherwise negative.  Allergies  Allergen Reactions  . Codeine Itching  . Other     NUTS ?LOBSTER  . Penicillins Rash  . Sulfa Antibiotics Rash    Current Outpatient Prescriptions  Medication Sig Dispense Refill  . aspirin EC 81 MG tablet Take 81 mg by mouth daily.    Marland Kitchen atorvastatin (LIPITOR) 80 MG tablet Take 1 tablet (80 mg total) by mouth daily at 6 PM. 30 tablet 11  . EPIPEN 2-PAK 0.3 MG/0.3ML SOAJ injection Inject 0.3 mg into the muscle once.     . hydrochlorothiazide (HYDRODIURIL) 25 MG tablet Take 25 mg by mouth daily.     . metFORMIN (GLUCOPHAGE-XR) 500 MG 24 hr tablet Take 0.5 tablets by mouth daily.    . metoprolol tartrate (LOPRESSOR) 25 MG tablet Take 1 tablet (25 mg total) by mouth 2 (two) times daily. 60 tablet 11  . nitroGLYCERIN (NITROSTAT) 0.4 MG SL tablet Place 1 tablet (0.4 mg total) under the tongue every 5 (five) minutes as needed for chest pain. 25 tablet 12  . sertraline (ZOLOFT) 25 MG tablet Take 1 tablet by mouth daily.    . ticagrelor (BRILINTA) 90 MG TABS tablet Take 1 tablet (90 mg total) by mouth 2 (two) times daily. 60 tablet 11   No current facility-administered medications for this visit.    Past Medical History  Diagnosis Date  . HTN (hypertension)   . Tobacco abuse   . DM (diabetes mellitus)      a. diet controlled.   Marland Kitchen CAD (coronary artery disease)     a. 09/25/13 s/p DES to [facility] and DES to pRCA.  Marland Kitchen Myocardial infarction 09/2013    nstemi  . Anginal pain   . Arthritis     Past Surgical History  Procedure Laterality Date  . Abdominal hysterectomy    . Appendectomy    . Skin grafts left arm    . Ankle surgery    . Coronary angioplasty  09/25/2013    des mid circumflex  des rca  . Left heart catheterization with coronary angiogram N/A 09/25/2013    Procedure: LEFT HEART CATHETERIZATION WITH CORONARY ANGIOGRAM;  Surgeon: Burnell Blanks, MD;  Location: [facility] CATH LAB;  Service: Cardiovascular;  Laterality: N/A;    History   Social History  . Marital Status: Married    Spouse Name: N/A    Number of Children: 2  . Years of Education: N/A   Occupational History  . Biochemist, clinical    Social History Main Topics  . Smoking status: Current Every Day Smoker -- 1.00 packs/day for 50 years    Types: Cigarettes    Start date: 06/04/1964  . Smokeless tobacco: Never Used     Comment: currently using the nicotine patch/last time she smoked was last Monday 09/25/13  . Alcohol Use: No  . Drug Use: No  . Sexual  Activity: Not on file   Other Topics Concern  . Not on file   Social History Narrative     Filed Vitals:   05/29/14 1554  BP: 129/78  Pulse: 76  Height: 5\' 3"  (1.6 m)  Weight: 143 lb (64.864 kg)    PHYSICAL EXAM General: NAD HEENT: Normal. Neck: No JVD, no thyromegaly. Lungs: Clear to auscultation bilaterally with normal respiratory effort. CV: Nondisplaced PMI.  Regular rate and rhythm, normal S1/S2, no S3/S4, no murmur. No pretibial or periankle edema.  No carotid bruit.  Normal pedal pulses.  Abdomen: Soft, nontender, no hepatosplenomegaly, no distention.  Neurologic: Alert and oriented x 3.  Psych: Normal affect. Skin: Normal. Musculoskeletal: Normal range of motion, no gross deformities. Extremities: No clubbing or cyanosis.   ECG: Most  recent ECG reviewed.      ASSESSMENT AND PLAN: 1. CAD s/p NSTEMI with DES to mid LCx and prox RCA: Stable ischemic heart disease. Continue aspirin, Lipitor, metoprolol, and Brilinta.  2. Essential HTN: Controlled on current therapy which includes HCTZ and metoprolol. No changes. 3. Tobacco abuse: Extensive cessation counseling given.  4. Hyperlipidemia: I will check lipids and continue present dose of statin therapy.  Dispo: f/u 6 months.   Kate Sable, M.D., F.A.C.C.

## 2014-05-29 NOTE — Patient Instructions (Signed)
Your physician wants you to follow-up in: 6 months with Dr. Virgina Jock will receive a reminder letter in the mail two months in advance. If you don't receive a letter, please call our office to schedule the follow-up appointment.  Your physician recommends that you continue on your current medications as directed. Please refer to the Current Medication list given to you today.  Your physician recommends that you return for lab work HFT/LIPIDS   Thank you for choosing [facility]!!

## 2014-07-23 ENCOUNTER — Telehealth: Payer: Self-pay | Admitting: *Deleted

## 2014-07-23 NOTE — Telephone Encounter (Signed)
-----   Message from Herminio Commons, MD sent at 07/23/2014 10:06 AM EDT ----- Very good results.

## 2014-07-23 NOTE — Telephone Encounter (Signed)
Pt made aware, forwarded to Dr. Manuella Ghazi

## 2014-09-05 ENCOUNTER — Telehealth: Payer: Self-pay | Admitting: Cardiovascular Disease

## 2014-09-05 MED ORDER — TICAGRELOR 90 MG PO TABS: 90.0000 mg | ORAL_TABLET | Freq: Two times a day (BID) | ORAL | Status: DC

## 2014-09-05 NOTE — Telephone Encounter (Signed)
Can be reduced to 60 mg bid after May.

## 2014-09-05 NOTE — Telephone Encounter (Signed)
Pt needed refills of Brillinta. Pt says pharmacist informed her that she may could decrease dose after 1 yr after MI which would be May. Pt is compliant with current dose and reports no problems. Refills sent to [facility] pt has appt July 2016. Will forward to Dr. Bronson Ing as Juluis Rainier

## 2014-09-05 NOTE — Telephone Encounter (Signed)
Rachel Perkins has questions about her medications and refills.

## 2014-09-05 NOTE — Telephone Encounter (Signed)
Pt made aware but will wait until July appt to decrease medication.

## 2014-12-04 ENCOUNTER — Encounter: Payer: Self-pay | Admitting: Cardiovascular Disease

## 2014-12-04 ENCOUNTER — Ambulatory Visit (INDEPENDENT_AMBULATORY_CARE_PROVIDER_SITE_OTHER): Payer: Medicare Other | Admitting: Cardiovascular Disease

## 2014-12-04 VITALS — BP 102/64 | HR 79 | Ht 63.0 in | Wt 129.0 lb

## 2014-12-04 DIAGNOSIS — I2510 Atherosclerotic heart disease of native coronary artery without angina pectoris: Secondary | ICD-10-CM

## 2014-12-04 DIAGNOSIS — I10 Essential (primary) hypertension: Secondary | ICD-10-CM

## 2014-12-04 DIAGNOSIS — E785 Hyperlipidemia, unspecified: Secondary | ICD-10-CM

## 2014-12-04 DIAGNOSIS — I252 Old myocardial infarction: Secondary | ICD-10-CM

## 2014-12-04 DIAGNOSIS — Z716 Tobacco abuse counseling: Secondary | ICD-10-CM

## 2014-12-04 MED ORDER — VARENICLINE TARTRATE 0.5 MG X 11 & 1 MG X 42 PO MISC: ORAL | Status: DC

## 2014-12-04 MED ORDER — TICAGRELOR 60 MG PO TABS: 60.0000 mg | ORAL_TABLET | Freq: Two times a day (BID) | ORAL | Status: DC

## 2014-12-04 NOTE — Progress Notes (Signed)
Patient ID: Rachel Perkins, female   DOB: 1950/08/06, 64 y.o.   MRN: [id number]      SUBJECTIVE: The patient presents for routine follow up. She has a history of non-STEMI in 09/2013 for which she underwent drug-eluting stent placement to the mid left circumflex and proximal right coronary arteries. Left ventriculography demonstrated normal left ventricular systolic function. She has a history of diet controlled diabetes mellitus, hypertension, and tobacco abuse. She is on Brilinta.   She denies chest pain, leg swelling, palpitations, and exertional dyspnea. She continues to smoke 3-5 cigarettes daily but wants to quit. She denies bleeding problems but does have easy bruisability.  Works in a warehouse 6 hours daily.  ECG performed in the office today demonstrates normal sinus rhythm with no ischemic ST segment or T-wave abnormalities.  Review of Systems: As per "subjective", otherwise negative.  Allergies  Allergen Reactions  . Codeine Itching  . Other     NUTS ?LOBSTER  . Penicillins Rash  . Sulfa Antibiotics Rash    Current Outpatient Prescriptions  Medication Sig Dispense Refill  . aspirin EC 81 MG tablet Take 81 mg by mouth daily.    Marland Kitchen atorvastatin (LIPITOR) 80 MG tablet Take 1 tablet (80 mg total) by mouth daily at 6 PM. 30 tablet 11  . EPIPEN 2-PAK 0.3 MG/0.3ML SOAJ injection Inject 0.3 mg into the muscle once.     Marland Kitchen lisinopril (PRINIVIL,ZESTRIL) 2.5 MG tablet Take 2.5 mg by mouth daily.    . metFORMIN (GLUCOPHAGE-XR) 500 MG 24 hr tablet Take 0.5 tablets by mouth daily.    . metoprolol tartrate (LOPRESSOR) 25 MG tablet Take 1 tablet (25 mg total) by mouth 2 (two) times daily. 60 tablet 11  . nitroGLYCERIN (NITROSTAT) 0.4 MG SL tablet Place 1 tablet (0.4 mg total) under the tongue every 5 (five) minutes as needed for chest pain. 25 tablet 12  . sertraline (ZOLOFT) 25 MG tablet Take 1 tablet by mouth daily.    . ticagrelor (BRILINTA) 90 MG TABS tablet Take 1 tablet (90 mg  total) by mouth 2 (two) times daily. 60 tablet 3   No current facility-administered medications for this visit.    Past Medical History  Diagnosis Date  . HTN (hypertension)   . Tobacco abuse   . DM (diabetes mellitus)     a. diet controlled.   Marland Kitchen CAD (coronary artery disease)     a. 09/25/13 s/p DES to [facility] and DES to pRCA.  Marland Kitchen Myocardial infarction 09/2013    nstemi  . Anginal pain   . Arthritis     Past Surgical History  Procedure Laterality Date  . Abdominal hysterectomy    . Appendectomy    . Skin grafts left arm    . Ankle surgery    . Coronary angioplasty  09/25/2013    des mid circumflex  des rca  . Left heart catheterization with coronary angiogram N/A 09/25/2013    Procedure: LEFT HEART CATHETERIZATION WITH CORONARY ANGIOGRAM;  Surgeon: Burnell Blanks, MD;  Location: [facility] CATH LAB;  Service: Cardiovascular;  Laterality: N/A;    History   Social History  . Marital Status: Married    Spouse Name: N/A  . Number of Children: 2  . Years of Education: N/A   Occupational History  . Biochemist, clinical    Social History Main Topics  . Smoking status: Current Every Day Smoker -- 1.00 packs/day for 50 years    Types: Cigarettes    Start date: 06/04/1964  .  Smokeless tobacco: Never Used     Comment: currently using the nicotine patch/last time she smoked was last Monday 09/25/13  . Alcohol Use: No  . Drug Use: No  . Sexual Activity: Not on file   Other Topics Concern  . Not on file   Social History Narrative     Filed Vitals:   12/04/14 1300  BP: 102/64  Pulse: 79  Height: '5\' 3"'  (1.6 m)  Weight: 129 lb (58.514 kg)  SpO2: 96%    PHYSICAL EXAM General: NAD HEENT: Normal. Neck: No JVD, no thyromegaly. Lungs: Clear to auscultation bilaterally with normal respiratory effort. CV: Nondisplaced PMI.  Regular rate and rhythm, normal S1/S2, no S3/S4, no murmur. No pretibial or periankle edema.  No carotid bruit.  Normal pedal pulses.  Abdomen: Soft,  nontender, no hepatosplenomegaly, no distention.  Neurologic: Alert and oriented x 3.  Psych: Normal affect. Skin: Normal. Musculoskeletal: Normal range of motion, no gross deformities. Extremities: No clubbing or cyanosis.   ECG: Most recent ECG reviewed.      ASSESSMENT AND PLAN: 1. CAD s/p NSTEMI with DES to mid LCx and prox RCA: Stable ischemic heart disease. Continue aspirin, Lipitor, metoprolol, and Brilinta (reduce to 60 mg bid).   2. Essential HTN: Controlled on current therapy which includes HCTZ and metoprolol. No changes.  3. Tobacco abuse: Extensive cessation counseling given. Will prescribe Chantix starter kit.  4. Hyperlipidemia: Lipids on 07/17/2014 showed total cholesterol 110, triglycerides 101, HDL 64, LDL 26. Continue present dose of statin therapy.  Dispo: f/u 6 months.   Kate Sable, M.D., F.A.C.C.

## 2014-12-04 NOTE — Patient Instructions (Addendum)
   Decrease Brilinta to 60mg  twice a day  - samples given & new prescription sent to pharmacy today.  Chantix starter - take as directed.  Continue all other medications.   Your physician wants you to follow up in: 6 months.  You will receive a reminder letter in the mail one-two months in advance.  If you don't receive a letter, please call our office to schedule the follow up appointment

## 2014-12-04 NOTE — Addendum Note (Signed)
Addended by: Laurine Blazer on: 12/04/2014 02:07 PM   Modules accepted: Orders

## 2014-12-10 ENCOUNTER — Telehealth: Payer: Self-pay | Admitting: *Deleted

## 2014-12-10 NOTE — Telephone Encounter (Signed)
Fax received from [place] approved 09/05/2014 to 06/02/2015.

## 2015-04-08 ENCOUNTER — Telehealth: Payer: Self-pay | Admitting: Cardiovascular Disease

## 2015-04-08 MED ORDER — TICAGRELOR 60 MG PO TABS: 60.0000 mg | ORAL_TABLET | Freq: Two times a day (BID) | ORAL | Status: DC

## 2015-04-08 NOTE — Telephone Encounter (Signed)
Patient notified via voice mail - Brilinta 60mg  samples left up front for pick up.

## 2015-05-16 DIAGNOSIS — Z6825 Body mass index (BMI) 25.0-25.9, adult: Secondary | ICD-10-CM | POA: Diagnosis not present

## 2015-05-16 DIAGNOSIS — N182 Chronic kidney disease, stage 2 (mild): Secondary | ICD-10-CM | POA: Diagnosis not present

## 2015-05-16 DIAGNOSIS — R42 Dizziness and giddiness: Secondary | ICD-10-CM | POA: Diagnosis not present

## 2015-05-16 DIAGNOSIS — E1122 Type 2 diabetes mellitus with diabetic chronic kidney disease: Secondary | ICD-10-CM | POA: Diagnosis not present

## 2015-05-16 DIAGNOSIS — Z720 Tobacco use: Secondary | ICD-10-CM | POA: Diagnosis not present

## 2015-06-06 ENCOUNTER — Ambulatory Visit (INDEPENDENT_AMBULATORY_CARE_PROVIDER_SITE_OTHER): Payer: Medicare Other | Admitting: Cardiovascular Disease

## 2015-06-06 ENCOUNTER — Encounter: Payer: Self-pay | Admitting: Cardiovascular Disease

## 2015-06-06 VITALS — BP 123/68 | HR 68 | Ht 63.0 in | Wt 143.0 lb

## 2015-06-06 DIAGNOSIS — I10 Essential (primary) hypertension: Secondary | ICD-10-CM | POA: Diagnosis not present

## 2015-06-06 DIAGNOSIS — I252 Old myocardial infarction: Secondary | ICD-10-CM

## 2015-06-06 DIAGNOSIS — E785 Hyperlipidemia, unspecified: Secondary | ICD-10-CM | POA: Diagnosis not present

## 2015-06-06 DIAGNOSIS — I2510 Atherosclerotic heart disease of native coronary artery without angina pectoris: Secondary | ICD-10-CM

## 2015-06-06 DIAGNOSIS — Z720 Tobacco use: Secondary | ICD-10-CM

## 2015-06-06 NOTE — Patient Instructions (Signed)
Continue all current medications. Your physician wants you to follow up in: 6 months.  You will receive a reminder letter in the mail one-two months in advance.  If you don't receive a letter, please call our office to schedule the follow up appointment   

## 2015-06-06 NOTE — Progress Notes (Signed)
Patient ID: Rachel Perkins, female   DOB: 03/21/1951, 65 y.o.   MRN: [id number]      SUBJECTIVE: The patient presents for routine follow up. She has a history of non-STEMI in 09/2013 for which she underwent drug-eluting stent placement to the mid left circumflex and proximal right coronary arteries. Left ventriculography demonstrated normal left ventricular systolic function. She has a history of diet controlled diabetes mellitus, hypertension, and tobacco abuse. She is on Brilinta.   She denies chest pain, leg swelling, palpitations, and exertional dyspnea.  She has not quit smoking but wants to.  Review of Systems: As per "subjective", otherwise negative.  Allergies  Allergen Reactions  . Codeine Itching  . Other     NUTS ?LOBSTER  . Penicillins Rash  . Sulfa Antibiotics Rash    Current Outpatient Prescriptions  Medication Sig Dispense Refill  . aspirin EC 81 MG tablet Take 81 mg by mouth daily.    Marland Kitchen atorvastatin (LIPITOR) 80 MG tablet Take 1 tablet (80 mg total) by mouth daily at 6 PM. 30 tablet 11  . meclizine (ANTIVERT) 25 MG tablet Take 1 tablet by mouth 3 (three) times daily as needed.    . metFORMIN (GLUCOPHAGE-XR) 500 MG 24 hr tablet Take 0.5 tablets by mouth daily.    . metoprolol tartrate (LOPRESSOR) 25 MG tablet Take 1 tablet (25 mg total) by mouth 2 (two) times daily. 60 tablet 11  . nitroGLYCERIN (NITROSTAT) 0.4 MG SL tablet Place 1 tablet (0.4 mg total) under the tongue every 5 (five) minutes as needed for chest pain. 25 tablet 12  . sertraline (ZOLOFT) 25 MG tablet Take 1 tablet by mouth daily.    . ticagrelor (BRILINTA) 60 MG TABS tablet Take 1 tablet (60 mg total) by mouth 2 (two) times daily. 56 tablet 0  . EPIPEN 2-PAK 0.3 MG/0.3ML SOAJ injection Inject 0.3 mg into the muscle once. Reported on 06/06/2015    . varenicline (CHANTIX STARTING MONTH PAK) 0.5 MG X 11 & 1 MG X 42 tablet Take one 0.5 mg tablet by mouth once daily for 3 days, then increase to one 0.5 mg  tablet twice daily for 4 days, then increase to one 1 mg tablet twice daily. (Patient not taking: Reported on 06/06/2015) 53 tablet 0   No current facility-administered medications for this visit.    Past Medical History  Diagnosis Date  . HTN (hypertension)   . Tobacco abuse   . DM (diabetes mellitus) ([place])     a. diet controlled.   Marland Kitchen CAD (coronary artery disease)     a. 09/25/13 s/p DES to [facility] and DES to pRCA.  Marland Kitchen Myocardial infarction [facility]) 09/2013    nstemi  . Anginal pain ([place])   . Arthritis     Past Surgical History  Procedure Laterality Date  . Abdominal hysterectomy    . Appendectomy    . Skin grafts left arm    . Ankle surgery    . Coronary angioplasty  09/25/2013    des mid circumflex  des rca  . Left heart catheterization with coronary angiogram N/A 09/25/2013    Procedure: LEFT HEART CATHETERIZATION WITH CORONARY ANGIOGRAM;  Surgeon: Burnell Blanks, MD;  Location: [facility] CATH LAB;  Service: Cardiovascular;  Laterality: N/A;    Social History   Social History  . Marital Status: Married    Spouse Name: N/A  . Number of Children: 2  . Years of Education: N/A   Occupational History  . Biochemist, clinical  Social History Main Topics  . Smoking status: Current Every Day Smoker -- 1.00 packs/day for 50 years    Types: Cigarettes    Start date: 06/04/1964  . Smokeless tobacco: Never Used     Comment: currently using the nicotine patch/last time she smoked was last Monday 09/25/13  . Alcohol Use: No  . Drug Use: No  . Sexual Activity: Not on file   Other Topics Concern  . Not on file   Social History Narrative     Filed Vitals:   06/06/15 1251  BP: 123/68  Pulse: 68  Height: 5\' 3"  (1.6 m)  Weight: 143 lb (64.864 kg)    PHYSICAL EXAM General: NAD HEENT: Normal. Neck: No JVD, no thyromegaly. Lungs: Clear to auscultation bilaterally with normal respiratory effort. CV: Nondisplaced PMI.  Regular rate and rhythm, normal S1/S2, no S3/S4, no murmur.  No pretibial or periankle edema.  No carotid bruit.    Abdomen: Soft, nontender, no distention.  Neurologic: Alert and oriented x 3.  Psych: Normal affect. Skin: Normal. Musculoskeletal: No gross deformities. Extremities: No clubbing or cyanosis.   ECG: Most recent ECG reviewed.      ASSESSMENT AND PLAN: 1. CAD s/p NSTEMI with DES to mid LCx and prox RCA: Stable ischemic heart disease. Continue aspirin, Lipitor, metoprolol, and Brilinta. No longer on ACEI; stopped by PCP due to cough. May consider low-dose ARB in future but she reports SBP 100 at last PCP visit.  2. Essential HTN: Controlled on current therapy. No changes.  3. Tobacco abuse: Extensive cessation counseling given at prior visit and prescribed Chantix. However, she continues to smoke but says she wants to quit.  4. Hyperlipidemia: Lipids on 07/17/2014 showed total cholesterol 110, triglycerides 101, HDL 64, LDL 26. Continue present dose of statin therapy.  Dispo: f/u 6 months.   Kate Sable, M.D., F.A.C.C.

## 2015-08-26 DIAGNOSIS — I10 Essential (primary) hypertension: Secondary | ICD-10-CM | POA: Diagnosis not present

## 2015-08-26 DIAGNOSIS — E119 Type 2 diabetes mellitus without complications: Secondary | ICD-10-CM | POA: Diagnosis not present

## 2015-08-30 ENCOUNTER — Telehealth: Payer: Self-pay | Admitting: *Deleted

## 2015-08-30 MED ORDER — TICAGRELOR 60 MG PO TABS: 60.0000 mg | ORAL_TABLET | Freq: Two times a day (BID) | ORAL | Status: DC

## 2015-08-30 NOTE — Telephone Encounter (Signed)
Patient is willing to try 90 day supply for her Brilinta.  New rx sent to [facility] today.

## 2015-08-30 NOTE — Telephone Encounter (Addendum)
Received fax from [facility] - 90 day prescription conversion request for Brilinta 60mg .    Left message to return call.

## 2015-10-03 DIAGNOSIS — E119 Type 2 diabetes mellitus without complications: Secondary | ICD-10-CM | POA: Diagnosis not present

## 2015-10-03 DIAGNOSIS — I10 Essential (primary) hypertension: Secondary | ICD-10-CM | POA: Diagnosis not present

## 2015-10-11 DIAGNOSIS — Z1231 Encounter for screening mammogram for malignant neoplasm of breast: Secondary | ICD-10-CM | POA: Diagnosis not present

## 2015-11-06 DIAGNOSIS — I2510 Atherosclerotic heart disease of native coronary artery without angina pectoris: Secondary | ICD-10-CM | POA: Diagnosis not present

## 2015-11-06 DIAGNOSIS — R5383 Other fatigue: Secondary | ICD-10-CM | POA: Diagnosis not present

## 2015-11-06 DIAGNOSIS — Z7189 Other specified counseling: Secondary | ICD-10-CM | POA: Diagnosis not present

## 2015-11-06 DIAGNOSIS — N182 Chronic kidney disease, stage 2 (mild): Secondary | ICD-10-CM | POA: Diagnosis not present

## 2015-11-06 DIAGNOSIS — E7800 Pure hypercholesterolemia, unspecified: Secondary | ICD-10-CM | POA: Diagnosis not present

## 2015-11-06 DIAGNOSIS — Z6827 Body mass index (BMI) 27.0-27.9, adult: Secondary | ICD-10-CM | POA: Diagnosis not present

## 2015-11-06 DIAGNOSIS — Z1389 Encounter for screening for other disorder: Secondary | ICD-10-CM | POA: Diagnosis not present

## 2015-11-06 DIAGNOSIS — Z0000 Encounter for general adult medical examination without abnormal findings: Secondary | ICD-10-CM | POA: Diagnosis not present

## 2015-11-06 DIAGNOSIS — E559 Vitamin D deficiency, unspecified: Secondary | ICD-10-CM | POA: Diagnosis not present

## 2015-11-06 DIAGNOSIS — E1122 Type 2 diabetes mellitus with diabetic chronic kidney disease: Secondary | ICD-10-CM | POA: Diagnosis not present

## 2015-11-06 DIAGNOSIS — Z299 Encounter for prophylactic measures, unspecified: Secondary | ICD-10-CM | POA: Diagnosis not present

## 2015-11-06 DIAGNOSIS — Z1211 Encounter for screening for malignant neoplasm of colon: Secondary | ICD-10-CM | POA: Diagnosis not present

## 2015-12-17 ENCOUNTER — Encounter: Payer: Self-pay | Admitting: *Deleted

## 2015-12-17 ENCOUNTER — Ambulatory Visit (INDEPENDENT_AMBULATORY_CARE_PROVIDER_SITE_OTHER): Payer: Medicare Other | Admitting: Cardiovascular Disease

## 2015-12-17 ENCOUNTER — Encounter: Payer: Self-pay | Admitting: Cardiovascular Disease

## 2015-12-17 VITALS — BP 102/70 | HR 84 | Ht 63.0 in | Wt 149.0 lb

## 2015-12-17 DIAGNOSIS — E785 Hyperlipidemia, unspecified: Secondary | ICD-10-CM

## 2015-12-17 DIAGNOSIS — I2510 Atherosclerotic heart disease of native coronary artery without angina pectoris: Secondary | ICD-10-CM | POA: Diagnosis not present

## 2015-12-17 DIAGNOSIS — Z720 Tobacco use: Secondary | ICD-10-CM

## 2015-12-17 DIAGNOSIS — I252 Old myocardial infarction: Secondary | ICD-10-CM

## 2015-12-17 DIAGNOSIS — I10 Essential (primary) hypertension: Secondary | ICD-10-CM | POA: Diagnosis not present

## 2015-12-17 NOTE — Progress Notes (Signed)
SUBJECTIVE: The patient presents for routine follow up. She has a history of non-STEMI in 09/2013 for which she underwent drug-eluting stent placement to the mid left circumflex and proximal right coronary arteries. Left ventriculography demonstrated normal left ventricular systolic function. She has a history of diabetes mellitus, hypertension, and tobacco abuse. She is on Brilinta.   She denies chest pain, leg swelling, palpitations, and exertional dyspnea.   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 She has only used nitroglycerin once in the past 2 years.   Review of Systems: As per "subjective", otherwise negative.  Allergies  Allergen Reactions  . Codeine Itching  . Other     NUTS ?LOBSTER  . Penicillins Rash  . Sulfa Antibiotics Rash    Current Outpatient Prescriptions  Medication Sig Dispense Refill  . aspirin EC 81 MG tablet Take 81 mg by mouth daily.    Marland Kitchen atorvastatin (LIPITOR) 80 MG tablet Take 1 tablet (80 mg total) by mouth daily at 6 PM. 30 tablet 11  . meclizine (ANTIVERT) 25 MG tablet Take 1 tablet by mouth 3 (three) times daily as needed.    . metFORMIN (GLUCOPHAGE-XR) 500 MG 24 hr tablet Take 0.5 tablets by mouth daily.    . metoprolol tartrate (LOPRESSOR) 25 MG tablet Take 1 tablet (25 mg total) by mouth 2 (two) times daily. 60 tablet 11  . nitroGLYCERIN (NITROSTAT) 0.4 MG SL tablet Place 1 tablet (0.4 mg total) under the tongue every 5 (five) minutes as needed for chest pain. 25 tablet 12  . sertraline (ZOLOFT) 25 MG tablet Take 1 tablet by mouth daily.    . ticagrelor (BRILINTA) 60 MG TABS tablet Take 1 tablet (60 mg total) by mouth 2 (two) times daily. 180 tablet 3   No current facility-administered medications for this visit.     Past Medical History:  Diagnosis Date  . Anginal pain ([place])   . Arthritis   . CAD (coronary artery disease)    a. 09/25/13 s/p DES to [facility] and DES to pRCA.  . DM (diabetes mellitus) ([place])    a.  diet controlled.   Marland Kitchen HTN (hypertension)   . Myocardial infarction [facility]) 09/2013   nstemi  . Tobacco abuse     Past Surgical History:  Procedure Laterality Date  . ABDOMINAL HYSTERECTOMY    . ANKLE SURGERY    . APPENDECTOMY    . CORONARY ANGIOPLASTY  09/25/2013   des mid circumflex  des rca  . LEFT HEART CATHETERIZATION WITH CORONARY ANGIOGRAM N/A 09/25/2013   Procedure: LEFT HEART CATHETERIZATION WITH CORONARY ANGIOGRAM;  Surgeon: Burnell Blanks, MD;  Location: [facility] CATH LAB;  Service: Cardiovascular;  Laterality: N/A;  . skin grafts left arm      Social History   Social History  . Marital status: Married    Spouse name: N/A  . Number of children: 2  . Years of education: N/A   Occupational History  . Biochemist, clinical    Social History Main Topics  . Smoking status: Former Smoker    Packs/day: 1.00    Years: 50.00    Types: Cigarettes    Start date: 06/04/1964    Quit date: 11/16/2015  . Smokeless tobacco: Never Used     Comment: currently using the nicotine patch/last time she smoked was last Monday 09/25/13  . Alcohol use No  . Drug use: No  . Sexual activity: Not on file   Other Topics Concern  .  Not on file   Social History Narrative  . No narrative on file     Vitals:   12/17/15 1348  BP: 102/70  Pulse: 84  SpO2: 96%  Weight: 149 lb (67.6 kg)  Height: 5\' 3"  (1.6 m)    PHYSICAL EXAM General: NAD HEENT: Normal. Neck: No JVD, no thyromegaly. Lungs: Clear to auscultation bilaterally with normal respiratory effort. CV: Nondisplaced PMI.  Regular rate and rhythm, normal S1/S2, no S3/S4, no murmur. No pretibial or periankle edema.  No carotid bruit.   Abdomen: Soft, nontender, no distention.  Neurologic: Alert and oriented.  Psych: Normal affect. Skin: Normal. Musculoskeletal: No gross deformities.    ECG: Most recent ECG reviewed.      ASSESSMENT AND PLAN: 1. CAD s/p NSTEMI with DES to mid LCx and prox RCA: Stable ischemic heart disease.  Continue aspirin, Lipitor, metoprolol, and Brilinta. No longer on ACEI; stopped by PCP due to cough. May consider low-dose ARB in future but BP low normal today.  2. Essential HTN: Controlled on current therapy. No changes.  3. Tobacco abuse: Extensive cessation counseling given at prior visit and prescribed Chantix.   4. Hyperlipidemia: Will obtain copy of lipids from PCP. Continue present dose of statin therapy.  Dispo: f/u 1 year.  Kate Sable, M.D., F.A.C.C.

## 2015-12-17 NOTE — Patient Instructions (Signed)
Medication Instructions:  Continue all current medications.  Labwork: None.  Testing/Procedures: None.  Follow-Up: Your physician wants you to follow up in:  1 year.  You will receive a reminder letter in the mail one-two months in advance.  If you don't receive a letter, please call our office to schedule the follow up appointment   Any Other Special Instructions Will Be Listed Below (If Applicable).  If you need a refill on your cardiac medications before your next appointment, please call your pharmacy.  

## 2016-01-24 ENCOUNTER — Other Ambulatory Visit: Payer: Self-pay | Admitting: Cardiovascular Disease

## 2016-01-24 DIAGNOSIS — Z23 Encounter for immunization: Secondary | ICD-10-CM | POA: Diagnosis not present

## 2016-01-27 DIAGNOSIS — Z23 Encounter for immunization: Secondary | ICD-10-CM | POA: Diagnosis not present

## 2016-02-06 DIAGNOSIS — E7800 Pure hypercholesterolemia, unspecified: Secondary | ICD-10-CM | POA: Diagnosis not present

## 2016-02-06 DIAGNOSIS — K635 Polyp of colon: Secondary | ICD-10-CM | POA: Diagnosis not present

## 2016-02-06 DIAGNOSIS — Z1211 Encounter for screening for malignant neoplasm of colon: Secondary | ICD-10-CM | POA: Diagnosis not present

## 2016-02-06 DIAGNOSIS — Z882 Allergy status to sulfonamides status: Secondary | ICD-10-CM | POA: Diagnosis not present

## 2016-02-06 DIAGNOSIS — I10 Essential (primary) hypertension: Secondary | ICD-10-CM | POA: Diagnosis not present

## 2016-02-06 DIAGNOSIS — F329 Major depressive disorder, single episode, unspecified: Secondary | ICD-10-CM | POA: Diagnosis not present

## 2016-02-06 DIAGNOSIS — Z86010 Personal history of colonic polyps: Secondary | ICD-10-CM | POA: Diagnosis not present

## 2016-02-06 DIAGNOSIS — Z91018 Allergy to other foods: Secondary | ICD-10-CM | POA: Diagnosis not present

## 2016-02-06 DIAGNOSIS — K5730 Diverticulosis of large intestine without perforation or abscess without bleeding: Secondary | ICD-10-CM | POA: Diagnosis not present

## 2016-02-06 DIAGNOSIS — E119 Type 2 diabetes mellitus without complications: Secondary | ICD-10-CM | POA: Diagnosis not present

## 2016-02-06 DIAGNOSIS — Z880 Allergy status to penicillin: Secondary | ICD-10-CM | POA: Diagnosis not present

## 2016-02-06 DIAGNOSIS — Z955 Presence of coronary angioplasty implant and graft: Secondary | ICD-10-CM | POA: Diagnosis not present

## 2016-02-06 DIAGNOSIS — F17200 Nicotine dependence, unspecified, uncomplicated: Secondary | ICD-10-CM | POA: Diagnosis not present

## 2016-02-06 DIAGNOSIS — I252 Old myocardial infarction: Secondary | ICD-10-CM | POA: Diagnosis not present

## 2016-02-06 DIAGNOSIS — D127 Benign neoplasm of rectosigmoid junction: Secondary | ICD-10-CM | POA: Diagnosis not present

## 2016-02-06 DIAGNOSIS — Z886 Allergy status to analgesic agent status: Secondary | ICD-10-CM | POA: Diagnosis not present

## 2016-02-13 DIAGNOSIS — H35012 Changes in retinal vascular appearance, left eye: Secondary | ICD-10-CM | POA: Diagnosis not present

## 2016-02-13 DIAGNOSIS — H5000 Unspecified esotropia: Secondary | ICD-10-CM | POA: Diagnosis not present

## 2016-02-13 DIAGNOSIS — H52223 Regular astigmatism, bilateral: Secondary | ICD-10-CM | POA: Diagnosis not present

## 2016-02-13 DIAGNOSIS — H5089 Other specified strabismus: Secondary | ICD-10-CM | POA: Diagnosis not present

## 2016-02-13 DIAGNOSIS — E119 Type 2 diabetes mellitus without complications: Secondary | ICD-10-CM | POA: Diagnosis not present

## 2016-02-13 DIAGNOSIS — H5203 Hypermetropia, bilateral: Secondary | ICD-10-CM | POA: Diagnosis not present

## 2016-02-13 DIAGNOSIS — H35451 Secondary pigmentary degeneration, right eye: Secondary | ICD-10-CM | POA: Diagnosis not present

## 2016-03-05 DIAGNOSIS — N182 Chronic kidney disease, stage 2 (mild): Secondary | ICD-10-CM | POA: Diagnosis not present

## 2016-03-05 DIAGNOSIS — I10 Essential (primary) hypertension: Secondary | ICD-10-CM | POA: Diagnosis not present

## 2016-03-05 DIAGNOSIS — E7800 Pure hypercholesterolemia, unspecified: Secondary | ICD-10-CM | POA: Diagnosis not present

## 2016-03-05 DIAGNOSIS — Z299 Encounter for prophylactic measures, unspecified: Secondary | ICD-10-CM | POA: Diagnosis not present

## 2016-03-05 DIAGNOSIS — E1122 Type 2 diabetes mellitus with diabetic chronic kidney disease: Secondary | ICD-10-CM | POA: Diagnosis not present

## 2016-03-05 DIAGNOSIS — Z6827 Body mass index (BMI) 27.0-27.9, adult: Secondary | ICD-10-CM | POA: Diagnosis not present

## 2016-03-20 DIAGNOSIS — E119 Type 2 diabetes mellitus without complications: Secondary | ICD-10-CM | POA: Diagnosis not present

## 2016-03-20 DIAGNOSIS — I10 Essential (primary) hypertension: Secondary | ICD-10-CM | POA: Diagnosis not present

## 2016-04-09 ENCOUNTER — Other Ambulatory Visit: Payer: Self-pay | Admitting: Cardiovascular Disease

## 2016-04-09 MED ORDER — TICAGRELOR 60 MG PO TABS: 60.0000 mg | ORAL_TABLET | Freq: Two times a day (BID) | ORAL | 0 refills | Status: DC

## 2016-04-09 NOTE — Telephone Encounter (Signed)
ticagrelor (BRILINTA) 60 MG TABS tablet   Asking for samples due to being in the doughnut whole

## 2016-04-09 NOTE — Telephone Encounter (Signed)
Pt will come by office before 5pm today to pick up samples

## 2016-05-05 DIAGNOSIS — M545 Low back pain: Secondary | ICD-10-CM | POA: Diagnosis not present

## 2016-05-05 DIAGNOSIS — M47816 Spondylosis without myelopathy or radiculopathy, lumbar region: Secondary | ICD-10-CM | POA: Diagnosis not present

## 2016-05-20 ENCOUNTER — Telehealth: Payer: Self-pay | Admitting: Physician Assistant

## 2016-05-20 ENCOUNTER — Encounter: Payer: Self-pay | Admitting: Physician Assistant

## 2016-05-20 ENCOUNTER — Ambulatory Visit (INDEPENDENT_AMBULATORY_CARE_PROVIDER_SITE_OTHER): Payer: Medicare Other | Admitting: Physician Assistant

## 2016-05-20 VITALS — BP 130/76 | HR 67 | Ht 63.0 in | Wt 144.0 lb

## 2016-05-20 DIAGNOSIS — R079 Chest pain, unspecified: Secondary | ICD-10-CM | POA: Diagnosis not present

## 2016-05-20 DIAGNOSIS — I10 Essential (primary) hypertension: Secondary | ICD-10-CM

## 2016-05-20 DIAGNOSIS — I2510 Atherosclerotic heart disease of native coronary artery without angina pectoris: Secondary | ICD-10-CM

## 2016-05-20 NOTE — Telephone Encounter (Signed)
Pt was confused on the dosage of nitro. Answered all questions. He voiced understanding.

## 2016-05-20 NOTE — Patient Instructions (Signed)
Your physician recommends that you schedule a follow-up appointment with Dr. Adine Madura as planned.   Your physician recommends that you continue on your current medications as directed. Please refer to the Current Medication list given to you today.  If you need a refill on your cardiac medications before your next appointment, please call your pharmacy.  Thank you for choosing [place]!

## 2016-05-20 NOTE — Progress Notes (Signed)
Cardiology Office Note    Date:  05/20/2016   ID:  [name], Stadtmiller December 19, 1950, MRN [id number]  PCP:  Monico Blitz, MD  Cardiologist: Dr. Bronson Ing  Chief Complaint  Patient presents with  . Chest Pain    History of Present Illness:  Rachel Perkins is a 66 y.o. female with history of CAD status post non-STEMI 09/2013 treated with DES to the mid circumflex and proximal RCA. She had normal LVEF. She also has hypertension, DM, and tobacco abuse. She has been on Brilinta. Last saw Dr. Bronson Ing 12/2015. ACE inhibitor had been stopped by his PCP due to cough. He considered low dose ARB but her blood pressure was low normal so he held off.  Patient was added onto my schedule because of chest pain. 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 She usually works 5 hours a day but has been working several 10 hour days where she does heavy lifting. Sunday she developed some twinge-like chest pain that comes and goes. She says it is not anything like when she had her MI. She says it's fleeting and has no associated radiation, dyspnea, dyspnea on exertion, nausea, dizziness or presyncope. She denies any chest tightness or pressure. When she had her MI she had left arm pain and chest pressure. She also ate meat loaf and had a lot of indigestion Monday but that has resolved. She has been able to work without stopping and is just aware that she feels a little twinge. Overall she's been feeling well and that's why she agreed to work more hours.    Past Medical History:  Diagnosis Date  . Anginal pain ([place])   . Arthritis   . CAD (coronary artery disease)    a. 09/25/13 s/p DES to [facility] and DES to pRCA.  . DM (diabetes mellitus) ([place])    a. diet controlled.   Marland Kitchen HTN (hypertension)   . Myocardial infarction 09/2013   nstemi  . Tobacco abuse     Past Surgical History:  Procedure Laterality Date  . ABDOMINAL HYSTERECTOMY    . ANKLE SURGERY    . APPENDECTOMY    .  CORONARY ANGIOPLASTY  09/25/2013   des mid circumflex  des rca  . LEFT HEART CATHETERIZATION WITH CORONARY ANGIOGRAM N/A 09/25/2013   Procedure: LEFT HEART CATHETERIZATION WITH CORONARY ANGIOGRAM;  Surgeon: Burnell Blanks, MD;  Location: [facility] CATH LAB;  Service: Cardiovascular;  Laterality: N/A;  . skin grafts left arm      Current Medications: Outpatient Medications Prior to Visit  Medication Sig Dispense Refill  . aspirin EC 81 MG tablet Take 81 mg by mouth daily.    Marland Kitchen atorvastatin (LIPITOR) 80 MG tablet Take 1 tablet (80 mg total) by mouth daily at 6 PM. 30 tablet 11  . meclizine (ANTIVERT) 25 MG tablet Take 1 tablet by mouth 3 (three) times daily as needed.    . metFORMIN (GLUCOPHAGE-XR) 500 MG 24 hr tablet Take 0.5 tablets by mouth daily.    . metoprolol tartrate (LOPRESSOR) 25 MG tablet Take 1 tablet (25 mg total) by mouth 2 (two) times daily. 60 tablet 11  . NITROSTAT 0.4 MG SL tablet PLACE 1 TABLET UNDER THE TONGUE EVERY 5 MINUTES FOR 3 DOSES AS NEEDED CHEST PAIN --FILL ONLY WHEN PATIENT REQUESTS-- 25 tablet 3  . ticagrelor (BRILINTA) 60 MG TABS tablet Take 1 tablet (60 mg total) by mouth 2 (two) times daily. 56 tablet 0  .  sertraline (ZOLOFT) 25 MG tablet Take 1 tablet by mouth daily.     No facility-administered medications prior to visit.      Allergies:   Codeine; Other; Penicillins; and Sulfa antibiotics   Social History   Social History  . Marital status: Married    Spouse name: N/A  . Number of children: 2  . Years of education: N/A   Occupational History  . Biochemist, clinical    Social History Main Topics  . Smoking status: Former Smoker    Packs/day: 1.00    Years: 50.00    Types: Cigarettes    Start date: 06/04/1964    Quit date: 11/16/2015  . Smokeless tobacco: Never Used     Comment: currently using the nicotine patch/last time she smoked was last Monday 09/25/13  . Alcohol use No  . Drug use: No  . Sexual activity: Not on file   Other Topics Concern   . Not on file   Social History Narrative  . No narrative on file     Family History:  The patient's   family history includes Heart failure (age of onset: 66) in her father.   ROS:   Please see the history of present illness.    Review of Systems  Constitution: Negative.  HENT: Negative.   Eyes: Negative.   Cardiovascular: Positive for chest pain.  Respiratory: Negative.   Hematologic/Lymphatic: Negative.   Musculoskeletal: Negative.  Negative for joint pain.  Gastrointestinal: Negative.   Genitourinary: Negative.   Neurological: Negative.    All other systems reviewed and are negative.   PHYSICAL EXAM:   VS:  BP 130/76   Pulse 67   Ht 5\' 3"  (1.6 m)   Wt 144 lb (65.3 kg)   SpO2 97%   BMI 25.51 kg/m   Physical Exam  GEN: Well nourished, well developed, in no acute distress  Neck: no JVD, carotid bruits, or masses Cardiac:RRR; no murmurs, rubs, or gallops  Respiratory:  clear to auscultation bilaterally, normal work of breathing GI: soft, nontender, nondistended, + BS Ext: without cyanosis, clubbing, or edema, Good distal pulses bilaterally Psych: euthymic mood, full affect  Wt Readings from Last 3 Encounters:  05/20/16 144 lb (65.3 kg)  12/17/15 149 lb (67.6 kg)  06/06/15 143 lb (64.9 kg)      Studies/Labs Reviewed:   EKG:  EKG is ordered today.  The ekg ordered today demonstrates Normal sinus rhythm with nonspecific ST-T wave changes, no acute change  Recent Labs: No results found for requested labs within last 8760 hours.   Lipid Panel No results found for: CHOL, TRIG, HDL, CHOLHDL, VLDL, LDLCALC, LDLDIRECT  Additional studies/ records that were reviewed today include:  PCI Note: The patient was given an additional 5000 Units IV heparin. ACT was over 400. She was given Brilinta 180 mg po x 1.    Lesion #1 (mid Circumflex): Cougar IC wire down Circumflex. 2.0 x 12 mm balloon x 1. 2.25 x 12 mm Resolute DES x 1. Post-dilated with a 2.5 x 8 mm [place] balloon x  1. Stenosis taken from 95% down to 0%.    Lesion #2 (proximal RCA): Cougar IC wire down RCA. Direct stent placement with 3.0 x 15 mm Resolute Integrity DES. Post-dilated with a 3.5 x 12 mm [place] balloon x 1. Stenosis from 80% down to 0%. Of note, this was an ulcerated plaque.    There were no immediate complications. The patient was taken to the recovery area in stable condition.  Hemodynamic Findings: Central aortic pressure: 129/63 Left ventricular pressure: 119/7/16 Impression: 1. NSTEMI 2. Severe stenosis mid Circumflex now s/p successful PTCA/DES x 1 mid Circumflex 3. Severe stenosis proximal RCA (appears ulcerated) now s/p successful PTCA/DES x 1 proximal RCA 4. Normal LV function   Recommendations: ASA and Brilinta x 1 year. Start statin and beta blocker.        Complications:  None. The patient tolerated the procedure well.             ASSESSMENT:    1. Chest pain, unspecified type   2. Atherosclerosis of native coronary artery of native heart without angina pectoris   3. Essential hypertension      PLAN:  In order of problems listed above:  Chest pain suspect musculoskeletal with working extra hours and heavy lifting. EKG unchanged Not anything like her prior angina. I've asked her to cut back on her hours at work and heavy lifting to see if this helps. Call if she continues to have difficulties.  CAD status post non-STEMI treated with DES to the circumflex and RCA. Doing well without angina. Continue Brilinta, aspirin, Lipitor and metoprolol  Essential hypertension well controlled      Medication Adjustments/Labs and Tests Ordered: Current medicines are reviewed at length with the patient today.  Concerns regarding medicines are outlined above.  Medication changes, Labs and Tests ordered today are listed in the Patient Instructions below. Patient Instructions  Your physician recommends that you schedule a follow-up appointment with Dr. Adine Madura as planned.    Your physician recommends that you continue on your current medications as directed. Please refer to the Current Medication list given to you today.  If you need a refill on your cardiac medications before your next appointment, please call your pharmacy.  Thank you for choosing [facility]!       Sumner Boast, PA-C  05/20/2016 12:14 PM    [facility] [address], [place], [place]  [address] Phone: [phone]; Fax: [phone]

## 2016-05-20 NOTE — Telephone Encounter (Signed)
Pt has a question about taking Nitros

## 2016-06-17 DIAGNOSIS — N182 Chronic kidney disease, stage 2 (mild): Secondary | ICD-10-CM | POA: Diagnosis not present

## 2016-06-17 DIAGNOSIS — Z299 Encounter for prophylactic measures, unspecified: Secondary | ICD-10-CM | POA: Diagnosis not present

## 2016-06-17 DIAGNOSIS — Z713 Dietary counseling and surveillance: Secondary | ICD-10-CM | POA: Diagnosis not present

## 2016-06-17 DIAGNOSIS — R509 Fever, unspecified: Secondary | ICD-10-CM | POA: Diagnosis not present

## 2016-06-17 DIAGNOSIS — Z6826 Body mass index (BMI) 26.0-26.9, adult: Secondary | ICD-10-CM | POA: Diagnosis not present

## 2016-06-17 DIAGNOSIS — R6889 Other general symptoms and signs: Secondary | ICD-10-CM | POA: Diagnosis not present

## 2016-06-17 DIAGNOSIS — E1122 Type 2 diabetes mellitus with diabetic chronic kidney disease: Secondary | ICD-10-CM | POA: Diagnosis not present

## 2016-06-26 DIAGNOSIS — N183 Chronic kidney disease, stage 3 (moderate): Secondary | ICD-10-CM | POA: Diagnosis not present

## 2016-06-26 DIAGNOSIS — J209 Acute bronchitis, unspecified: Secondary | ICD-10-CM | POA: Diagnosis not present

## 2016-06-26 DIAGNOSIS — Z713 Dietary counseling and surveillance: Secondary | ICD-10-CM | POA: Diagnosis not present

## 2016-06-26 DIAGNOSIS — N182 Chronic kidney disease, stage 2 (mild): Secondary | ICD-10-CM | POA: Diagnosis not present

## 2016-06-26 DIAGNOSIS — Z87891 Personal history of nicotine dependence: Secondary | ICD-10-CM | POA: Diagnosis not present

## 2016-06-26 DIAGNOSIS — Z299 Encounter for prophylactic measures, unspecified: Secondary | ICD-10-CM | POA: Diagnosis not present

## 2016-06-26 DIAGNOSIS — Z6826 Body mass index (BMI) 26.0-26.9, adult: Secondary | ICD-10-CM | POA: Diagnosis not present

## 2016-06-26 DIAGNOSIS — E1122 Type 2 diabetes mellitus with diabetic chronic kidney disease: Secondary | ICD-10-CM | POA: Diagnosis not present

## 2016-06-26 DIAGNOSIS — I10 Essential (primary) hypertension: Secondary | ICD-10-CM | POA: Diagnosis not present

## 2016-07-24 DIAGNOSIS — R300 Dysuria: Secondary | ICD-10-CM | POA: Diagnosis not present

## 2016-07-24 DIAGNOSIS — Z6826 Body mass index (BMI) 26.0-26.9, adult: Secondary | ICD-10-CM | POA: Diagnosis not present

## 2016-07-24 DIAGNOSIS — E7800 Pure hypercholesterolemia, unspecified: Secondary | ICD-10-CM | POA: Diagnosis not present

## 2016-07-24 DIAGNOSIS — I10 Essential (primary) hypertension: Secondary | ICD-10-CM | POA: Diagnosis not present

## 2016-07-24 DIAGNOSIS — I2510 Atherosclerotic heart disease of native coronary artery without angina pectoris: Secondary | ICD-10-CM | POA: Diagnosis not present

## 2016-07-24 DIAGNOSIS — Z299 Encounter for prophylactic measures, unspecified: Secondary | ICD-10-CM | POA: Diagnosis not present

## 2016-07-24 DIAGNOSIS — N183 Chronic kidney disease, stage 3 (moderate): Secondary | ICD-10-CM | POA: Diagnosis not present

## 2016-07-24 DIAGNOSIS — E1122 Type 2 diabetes mellitus with diabetic chronic kidney disease: Secondary | ICD-10-CM | POA: Diagnosis not present

## 2016-07-24 DIAGNOSIS — J069 Acute upper respiratory infection, unspecified: Secondary | ICD-10-CM | POA: Diagnosis not present

## 2016-07-24 DIAGNOSIS — F419 Anxiety disorder, unspecified: Secondary | ICD-10-CM | POA: Diagnosis not present

## 2016-07-24 DIAGNOSIS — N182 Chronic kidney disease, stage 2 (mild): Secondary | ICD-10-CM | POA: Diagnosis not present

## 2016-07-24 DIAGNOSIS — M461 Sacroiliitis, not elsewhere classified: Secondary | ICD-10-CM | POA: Diagnosis not present

## 2016-09-16 DIAGNOSIS — I2510 Atherosclerotic heart disease of native coronary artery without angina pectoris: Secondary | ICD-10-CM | POA: Diagnosis not present

## 2016-09-16 DIAGNOSIS — F419 Anxiety disorder, unspecified: Secondary | ICD-10-CM | POA: Diagnosis not present

## 2016-09-16 DIAGNOSIS — N183 Chronic kidney disease, stage 3 (moderate): Secondary | ICD-10-CM | POA: Diagnosis not present

## 2016-09-16 DIAGNOSIS — E7800 Pure hypercholesterolemia, unspecified: Secondary | ICD-10-CM | POA: Diagnosis not present

## 2016-09-16 DIAGNOSIS — Z299 Encounter for prophylactic measures, unspecified: Secondary | ICD-10-CM | POA: Diagnosis not present

## 2016-09-16 DIAGNOSIS — I10 Essential (primary) hypertension: Secondary | ICD-10-CM | POA: Diagnosis not present

## 2016-09-16 DIAGNOSIS — E1122 Type 2 diabetes mellitus with diabetic chronic kidney disease: Secondary | ICD-10-CM | POA: Diagnosis not present

## 2016-09-16 DIAGNOSIS — Z6826 Body mass index (BMI) 26.0-26.9, adult: Secondary | ICD-10-CM | POA: Diagnosis not present

## 2016-09-16 DIAGNOSIS — R21 Rash and other nonspecific skin eruption: Secondary | ICD-10-CM | POA: Diagnosis not present

## 2016-09-16 DIAGNOSIS — Z87891 Personal history of nicotine dependence: Secondary | ICD-10-CM | POA: Diagnosis not present

## 2016-09-16 DIAGNOSIS — J0100 Acute maxillary sinusitis, unspecified: Secondary | ICD-10-CM | POA: Diagnosis not present

## 2016-09-29 DIAGNOSIS — Z299 Encounter for prophylactic measures, unspecified: Secondary | ICD-10-CM | POA: Diagnosis not present

## 2016-09-29 DIAGNOSIS — M8580 Other specified disorders of bone density and structure, unspecified site: Secondary | ICD-10-CM | POA: Diagnosis not present

## 2016-09-29 DIAGNOSIS — E7800 Pure hypercholesterolemia, unspecified: Secondary | ICD-10-CM | POA: Diagnosis not present

## 2016-09-29 DIAGNOSIS — Z6826 Body mass index (BMI) 26.0-26.9, adult: Secondary | ICD-10-CM | POA: Diagnosis not present

## 2016-09-29 DIAGNOSIS — N183 Chronic kidney disease, stage 3 (moderate): Secondary | ICD-10-CM | POA: Diagnosis not present

## 2016-09-29 DIAGNOSIS — M461 Sacroiliitis, not elsewhere classified: Secondary | ICD-10-CM | POA: Diagnosis not present

## 2016-09-29 DIAGNOSIS — I2510 Atherosclerotic heart disease of native coronary artery without angina pectoris: Secondary | ICD-10-CM | POA: Diagnosis not present

## 2016-09-29 DIAGNOSIS — I10 Essential (primary) hypertension: Secondary | ICD-10-CM | POA: Diagnosis not present

## 2016-09-29 DIAGNOSIS — E1122 Type 2 diabetes mellitus with diabetic chronic kidney disease: Secondary | ICD-10-CM | POA: Diagnosis not present

## 2016-09-29 DIAGNOSIS — Z955 Presence of coronary angioplasty implant and graft: Secondary | ICD-10-CM | POA: Diagnosis not present

## 2016-09-29 DIAGNOSIS — K219 Gastro-esophageal reflux disease without esophagitis: Secondary | ICD-10-CM | POA: Diagnosis not present

## 2016-09-29 DIAGNOSIS — F419 Anxiety disorder, unspecified: Secondary | ICD-10-CM | POA: Diagnosis not present

## 2016-10-13 DIAGNOSIS — L03011 Cellulitis of right finger: Secondary | ICD-10-CM | POA: Diagnosis not present

## 2016-10-13 DIAGNOSIS — N183 Chronic kidney disease, stage 3 (moderate): Secondary | ICD-10-CM | POA: Diagnosis not present

## 2016-10-13 DIAGNOSIS — I10 Essential (primary) hypertension: Secondary | ICD-10-CM | POA: Diagnosis not present

## 2016-10-13 DIAGNOSIS — E7800 Pure hypercholesterolemia, unspecified: Secondary | ICD-10-CM | POA: Diagnosis not present

## 2016-10-13 DIAGNOSIS — F419 Anxiety disorder, unspecified: Secondary | ICD-10-CM | POA: Diagnosis not present

## 2016-10-13 DIAGNOSIS — F17210 Nicotine dependence, cigarettes, uncomplicated: Secondary | ICD-10-CM | POA: Diagnosis not present

## 2016-10-13 DIAGNOSIS — Z6825 Body mass index (BMI) 25.0-25.9, adult: Secondary | ICD-10-CM | POA: Diagnosis not present

## 2016-10-13 DIAGNOSIS — Z299 Encounter for prophylactic measures, unspecified: Secondary | ICD-10-CM | POA: Diagnosis not present

## 2016-10-13 DIAGNOSIS — I2510 Atherosclerotic heart disease of native coronary artery without angina pectoris: Secondary | ICD-10-CM | POA: Diagnosis not present

## 2016-10-13 DIAGNOSIS — E1122 Type 2 diabetes mellitus with diabetic chronic kidney disease: Secondary | ICD-10-CM | POA: Diagnosis not present

## 2016-10-15 DIAGNOSIS — Z1231 Encounter for screening mammogram for malignant neoplasm of breast: Secondary | ICD-10-CM | POA: Diagnosis not present

## 2016-10-20 DIAGNOSIS — Z6825 Body mass index (BMI) 25.0-25.9, adult: Secondary | ICD-10-CM | POA: Diagnosis not present

## 2016-10-20 DIAGNOSIS — Z299 Encounter for prophylactic measures, unspecified: Secondary | ICD-10-CM | POA: Diagnosis not present

## 2016-10-20 DIAGNOSIS — N183 Chronic kidney disease, stage 3 (moderate): Secondary | ICD-10-CM | POA: Diagnosis not present

## 2016-10-20 DIAGNOSIS — L03039 Cellulitis of unspecified toe: Secondary | ICD-10-CM | POA: Diagnosis not present

## 2016-10-20 DIAGNOSIS — I10 Essential (primary) hypertension: Secondary | ICD-10-CM | POA: Diagnosis not present

## 2016-10-20 DIAGNOSIS — E7800 Pure hypercholesterolemia, unspecified: Secondary | ICD-10-CM | POA: Diagnosis not present

## 2016-11-06 DIAGNOSIS — Z1211 Encounter for screening for malignant neoplasm of colon: Secondary | ICD-10-CM | POA: Diagnosis not present

## 2016-11-06 DIAGNOSIS — I10 Essential (primary) hypertension: Secondary | ICD-10-CM | POA: Diagnosis not present

## 2016-11-06 DIAGNOSIS — N183 Chronic kidney disease, stage 3 (moderate): Secondary | ICD-10-CM | POA: Diagnosis not present

## 2016-11-06 DIAGNOSIS — Z6826 Body mass index (BMI) 26.0-26.9, adult: Secondary | ICD-10-CM | POA: Diagnosis not present

## 2016-11-06 DIAGNOSIS — Z299 Encounter for prophylactic measures, unspecified: Secondary | ICD-10-CM | POA: Diagnosis not present

## 2016-11-06 DIAGNOSIS — Z0000 Encounter for general adult medical examination without abnormal findings: Secondary | ICD-10-CM | POA: Diagnosis not present

## 2016-11-06 DIAGNOSIS — I2510 Atherosclerotic heart disease of native coronary artery without angina pectoris: Secondary | ICD-10-CM | POA: Diagnosis not present

## 2016-11-06 DIAGNOSIS — Z1389 Encounter for screening for other disorder: Secondary | ICD-10-CM | POA: Diagnosis not present

## 2016-11-06 DIAGNOSIS — Z7189 Other specified counseling: Secondary | ICD-10-CM | POA: Diagnosis not present

## 2016-11-06 DIAGNOSIS — E7800 Pure hypercholesterolemia, unspecified: Secondary | ICD-10-CM | POA: Diagnosis not present

## 2016-11-06 DIAGNOSIS — R5383 Other fatigue: Secondary | ICD-10-CM | POA: Diagnosis not present

## 2016-11-06 DIAGNOSIS — F419 Anxiety disorder, unspecified: Secondary | ICD-10-CM | POA: Diagnosis not present

## 2016-11-06 DIAGNOSIS — Z79899 Other long term (current) drug therapy: Secondary | ICD-10-CM | POA: Diagnosis not present

## 2016-11-06 DIAGNOSIS — E559 Vitamin D deficiency, unspecified: Secondary | ICD-10-CM | POA: Diagnosis not present

## 2016-11-12 ENCOUNTER — Other Ambulatory Visit: Payer: Self-pay | Admitting: Cardiovascular Disease

## 2016-11-18 DIAGNOSIS — E119 Type 2 diabetes mellitus without complications: Secondary | ICD-10-CM | POA: Diagnosis not present

## 2016-11-18 DIAGNOSIS — I10 Essential (primary) hypertension: Secondary | ICD-10-CM | POA: Diagnosis not present

## 2016-12-03 DIAGNOSIS — E1122 Type 2 diabetes mellitus with diabetic chronic kidney disease: Secondary | ICD-10-CM | POA: Diagnosis not present

## 2016-12-03 DIAGNOSIS — Z6826 Body mass index (BMI) 26.0-26.9, adult: Secondary | ICD-10-CM | POA: Diagnosis not present

## 2016-12-03 DIAGNOSIS — N183 Chronic kidney disease, stage 3 (moderate): Secondary | ICD-10-CM | POA: Diagnosis not present

## 2016-12-03 DIAGNOSIS — I10 Essential (primary) hypertension: Secondary | ICD-10-CM | POA: Diagnosis not present

## 2016-12-03 DIAGNOSIS — F419 Anxiety disorder, unspecified: Secondary | ICD-10-CM | POA: Diagnosis not present

## 2016-12-03 DIAGNOSIS — E7800 Pure hypercholesterolemia, unspecified: Secondary | ICD-10-CM | POA: Diagnosis not present

## 2016-12-03 DIAGNOSIS — Z299 Encounter for prophylactic measures, unspecified: Secondary | ICD-10-CM | POA: Diagnosis not present

## 2016-12-03 DIAGNOSIS — M549 Dorsalgia, unspecified: Secondary | ICD-10-CM | POA: Diagnosis not present

## 2016-12-03 DIAGNOSIS — M48061 Spinal stenosis, lumbar region without neurogenic claudication: Secondary | ICD-10-CM | POA: Diagnosis not present

## 2017-01-05 ENCOUNTER — Encounter: Payer: Self-pay | Admitting: Cardiovascular Disease

## 2017-01-05 ENCOUNTER — Ambulatory Visit (INDEPENDENT_AMBULATORY_CARE_PROVIDER_SITE_OTHER): Payer: Medicare Other | Admitting: Cardiovascular Disease

## 2017-01-05 VITALS — BP 120/71 | HR 66 | Ht 63.0 in | Wt 144.0 lb

## 2017-01-05 DIAGNOSIS — Z720 Tobacco use: Secondary | ICD-10-CM | POA: Diagnosis not present

## 2017-01-05 DIAGNOSIS — Z6826 Body mass index (BMI) 26.0-26.9, adult: Secondary | ICD-10-CM | POA: Diagnosis not present

## 2017-01-05 DIAGNOSIS — E785 Hyperlipidemia, unspecified: Secondary | ICD-10-CM | POA: Diagnosis not present

## 2017-01-05 DIAGNOSIS — M48061 Spinal stenosis, lumbar region without neurogenic claudication: Secondary | ICD-10-CM | POA: Diagnosis not present

## 2017-01-05 DIAGNOSIS — F419 Anxiety disorder, unspecified: Secondary | ICD-10-CM | POA: Diagnosis not present

## 2017-01-05 DIAGNOSIS — R7989 Other specified abnormal findings of blood chemistry: Secondary | ICD-10-CM

## 2017-01-05 DIAGNOSIS — N183 Chronic kidney disease, stage 3 (moderate): Secondary | ICD-10-CM | POA: Diagnosis not present

## 2017-01-05 DIAGNOSIS — E2839 Other primary ovarian failure: Secondary | ICD-10-CM | POA: Diagnosis not present

## 2017-01-05 DIAGNOSIS — I2510 Atherosclerotic heart disease of native coronary artery without angina pectoris: Secondary | ICD-10-CM

## 2017-01-05 DIAGNOSIS — E559 Vitamin D deficiency, unspecified: Secondary | ICD-10-CM

## 2017-01-05 DIAGNOSIS — I10 Essential (primary) hypertension: Secondary | ICD-10-CM | POA: Diagnosis not present

## 2017-01-05 DIAGNOSIS — E1122 Type 2 diabetes mellitus with diabetic chronic kidney disease: Secondary | ICD-10-CM | POA: Diagnosis not present

## 2017-01-05 DIAGNOSIS — I25118 Atherosclerotic heart disease of native coronary artery with other forms of angina pectoris: Secondary | ICD-10-CM

## 2017-01-05 DIAGNOSIS — E1165 Type 2 diabetes mellitus with hyperglycemia: Secondary | ICD-10-CM | POA: Diagnosis not present

## 2017-01-05 DIAGNOSIS — I252 Old myocardial infarction: Secondary | ICD-10-CM

## 2017-01-05 DIAGNOSIS — E7800 Pure hypercholesterolemia, unspecified: Secondary | ICD-10-CM | POA: Diagnosis not present

## 2017-01-05 DIAGNOSIS — Z299 Encounter for prophylactic measures, unspecified: Secondary | ICD-10-CM | POA: Diagnosis not present

## 2017-01-05 DIAGNOSIS — M8580 Other specified disorders of bone density and structure, unspecified site: Secondary | ICD-10-CM | POA: Diagnosis not present

## 2017-01-05 NOTE — Patient Instructions (Signed)
Medication Instructions:   Stop Brilinta.  Continue all other current medications.  Labwork: none  Testing/Procedures: none  Follow-Up: Your physician wants you to follow up in:  1 year.  You will receive a reminder letter in the mail one-two months in advance.  If you don't receive a letter, please call our office to schedule the follow up appointment   Any Other Special Instructions Will Be Listed Below (If Applicable).  If you need a refill on your cardiac medications before your next appointment, please call your pharmacy.

## 2017-01-05 NOTE — Progress Notes (Signed)
SUBJECTIVE: The patient presents for routine follow up. She has a history of non-STEMI in 09/2013 for which she underwent drug-eluting stent placement to the mid left circumflex and proximal right coronary arteries. Left ventriculography demonstrated normal left ventricular systolic function. She has a history of diabetes mellitus, hypertension, and tobacco abuse. She is on Brilinta.   She was evaluated in our office in January of this year for chest pain. It appeared to be musculoskeletal in etiology.  The patient denies any symptoms of chest pain, palpitations, shortness of breath, lightheadedness, dizziness, leg swelling, orthopnea, PND, and syncope.  Lipids on 11/06/16 showed total cholesterol 97, triglycerides 63, HDL 57, LDL 27. Vitamin D was low at 18.3.   Review of Systems: As per "subjective", otherwise negative.  Allergies  Allergen Reactions  . Codeine Itching  . Other     NUTS ?LOBSTER  . Penicillins Rash  . Sulfa Antibiotics Rash    Current Outpatient Prescriptions  Medication Sig Dispense Refill  . aspirin EC 81 MG tablet Take 81 mg by mouth daily.    Marland Kitchen atorvastatin (LIPITOR) 80 MG tablet Take 1 tablet (80 mg total) by mouth daily at 6 PM. 30 tablet 11  . BRILINTA 60 MG TABS tablet TAKE 1 TABLET BY MOUTH TWICE DAILY 180 tablet 1  . meclizine (ANTIVERT) 25 MG tablet Take 1 tablet by mouth 3 (three) times daily as needed.    . metFORMIN (GLUCOPHAGE-XR) 500 MG 24 hr tablet Take 0.5 tablets by mouth daily.    . metoprolol tartrate (LOPRESSOR) 25 MG tablet Take 1 tablet (25 mg total) by mouth 2 (two) times daily. 60 tablet 11  . NITROSTAT 0.4 MG SL tablet PLACE 1 TABLET UNDER THE TONGUE EVERY 5 MINUTES FOR 3 DOSES AS NEEDED CHEST PAIN --FILL ONLY WHEN PATIENT REQUESTS-- 25 tablet 3  . sertraline (ZOLOFT) 50 MG tablet      No current facility-administered medications for this visit.     Past Medical History:  Diagnosis Date  . Anginal pain ([place])   . Arthritis     . CAD (coronary artery disease)    a. 09/25/13 s/p DES to [facility] and DES to pRCA.  . DM (diabetes mellitus) ([place])    a. diet controlled.   Marland Kitchen HTN (hypertension)   . Myocardial infarction [facility]) 09/2013   nstemi  . Tobacco abuse     Past Surgical History:  Procedure Laterality Date  . ABDOMINAL HYSTERECTOMY    . ANKLE SURGERY    . APPENDECTOMY    . CORONARY ANGIOPLASTY  09/25/2013   des mid circumflex  des rca  . LEFT HEART CATHETERIZATION WITH CORONARY ANGIOGRAM N/A 09/25/2013   Procedure: LEFT HEART CATHETERIZATION WITH CORONARY ANGIOGRAM;  Surgeon: Burnell Blanks, MD;  Location: [facility] CATH LAB;  Service: Cardiovascular;  Laterality: N/A;  . skin grafts left arm      Social History   Social History  . Marital status: Married    Spouse name: N/A  . Number of children: 2  . Years of education: N/A   Occupational History  . Biochemist, clinical    Social History Main Topics  . Smoking status: Current Every Day Smoker    Packs/day: 0.50    Years: 50.00    Types: Cigarettes    Start date: 06/04/1964    Last attempt to quit: 11/16/2015  . Smokeless tobacco: Never Used  . Alcohol use No  . Drug use: No  . Sexual activity: Not on  file   Other Topics Concern  . Not on file   Social History Narrative  . No narrative on file     Vitals:   01/05/17 1526  BP: 120/71  Pulse: 66  SpO2: 98%  Weight: 144 lb (65.3 kg)  Height: 5\' 3"  (1.6 m)    Wt Readings from Last 3 Encounters:  01/05/17 144 lb (65.3 kg)  05/20/16 144 lb (65.3 kg)  12/17/15 149 lb (67.6 kg)     PHYSICAL EXAM General: NAD HEENT: Normal. Neck: No JVD, no thyromegaly. Lungs: Clear to auscultation bilaterally with normal respiratory effort. CV: Nondisplaced PMI.  Regular rate and rhythm, normal S1/S2, no S3/S4, no murmur. No pretibial or periankle edema.  No carotid bruit.   Abdomen: Soft, nontender, no distention.  Neurologic: Alert and oriented.  Psych: Normal affect. Skin:  Normal. Musculoskeletal: No gross deformities.    ECG: Most recent ECG reviewed.   Labs: Lab Results  Component Value Date/Time   K 3.7 09/26/2013 04:34 AM   BUN 15 09/26/2013 04:34 AM   CREATININE 0.83 09/26/2013 04:34 AM   HGB 13.8 09/26/2013 04:34 AM     Lipids: No results found for: LDLCALC, LDLDIRECT, CHOL, TRIG, HDL     ASSESSMENT AND PLAN:  1. CAD s/p NSTEMI with DES to mid LCx and prox RCA: Stable ischemic heart disease. Continue aspirin, Lipitor, and metoprolol. I will stop Brilinta. No longer on ACEI; stopped by PCP due to cough. May consider low-dose ARB in future.  2. Essential HTN: Controlled on current therapy. No changes.  3. Tobacco abuse: Extensive cessation counseling given at prior visit and prescribed Chantix.   4. Hyperlipidemia: Lipids on 11/06/16 showed total cholesterol 97, triglycerides 63, HDL 57, LDL 27. Continue Lipitor 80 mg.  5. Low vitamin D: Encouraged to purchase OTC supplementation.    Disposition: Follow up 1 year.   Kate Sable, M.D., F.A.C.C.

## 2017-01-20 DIAGNOSIS — E559 Vitamin D deficiency, unspecified: Secondary | ICD-10-CM | POA: Diagnosis not present

## 2017-01-20 DIAGNOSIS — I2510 Atherosclerotic heart disease of native coronary artery without angina pectoris: Secondary | ICD-10-CM | POA: Diagnosis not present

## 2017-01-20 DIAGNOSIS — E7800 Pure hypercholesterolemia, unspecified: Secondary | ICD-10-CM | POA: Diagnosis not present

## 2017-01-20 DIAGNOSIS — Z6826 Body mass index (BMI) 26.0-26.9, adult: Secondary | ICD-10-CM | POA: Diagnosis not present

## 2017-01-20 DIAGNOSIS — I10 Essential (primary) hypertension: Secondary | ICD-10-CM | POA: Diagnosis not present

## 2017-01-20 DIAGNOSIS — E1165 Type 2 diabetes mellitus with hyperglycemia: Secondary | ICD-10-CM | POA: Diagnosis not present

## 2017-01-20 DIAGNOSIS — N183 Chronic kidney disease, stage 3 (moderate): Secondary | ICD-10-CM | POA: Diagnosis not present

## 2017-01-20 DIAGNOSIS — F419 Anxiety disorder, unspecified: Secondary | ICD-10-CM | POA: Diagnosis not present

## 2017-01-20 DIAGNOSIS — E1122 Type 2 diabetes mellitus with diabetic chronic kidney disease: Secondary | ICD-10-CM | POA: Diagnosis not present

## 2017-01-20 DIAGNOSIS — M48061 Spinal stenosis, lumbar region without neurogenic claudication: Secondary | ICD-10-CM | POA: Diagnosis not present

## 2017-01-20 DIAGNOSIS — Z299 Encounter for prophylactic measures, unspecified: Secondary | ICD-10-CM | POA: Diagnosis not present

## 2017-01-27 DIAGNOSIS — M545 Low back pain: Secondary | ICD-10-CM | POA: Diagnosis not present

## 2017-01-27 DIAGNOSIS — Q7649 Other congenital malformations of spine, not associated with scoliosis: Secondary | ICD-10-CM | POA: Diagnosis not present

## 2017-01-27 DIAGNOSIS — M47816 Spondylosis without myelopathy or radiculopathy, lumbar region: Secondary | ICD-10-CM | POA: Diagnosis not present

## 2017-01-27 DIAGNOSIS — D3501 Benign neoplasm of right adrenal gland: Secondary | ICD-10-CM | POA: Diagnosis not present

## 2017-01-27 DIAGNOSIS — M48061 Spinal stenosis, lumbar region without neurogenic claudication: Secondary | ICD-10-CM | POA: Diagnosis not present

## 2017-01-27 DIAGNOSIS — M5136 Other intervertebral disc degeneration, lumbar region: Secondary | ICD-10-CM | POA: Diagnosis not present

## 2017-01-27 DIAGNOSIS — M419 Scoliosis, unspecified: Secondary | ICD-10-CM | POA: Diagnosis not present

## 2017-01-27 DIAGNOSIS — M5386 Other specified dorsopathies, lumbar region: Secondary | ICD-10-CM | POA: Diagnosis not present

## 2017-01-27 DIAGNOSIS — D3502 Benign neoplasm of left adrenal gland: Secondary | ICD-10-CM | POA: Diagnosis not present

## 2017-02-02 DIAGNOSIS — E119 Type 2 diabetes mellitus without complications: Secondary | ICD-10-CM | POA: Diagnosis not present

## 2017-02-02 DIAGNOSIS — I10 Essential (primary) hypertension: Secondary | ICD-10-CM | POA: Diagnosis not present

## 2017-02-04 DIAGNOSIS — Z882 Allergy status to sulfonamides status: Secondary | ICD-10-CM | POA: Diagnosis not present

## 2017-02-04 DIAGNOSIS — K644 Residual hemorrhoidal skin tags: Secondary | ICD-10-CM | POA: Diagnosis not present

## 2017-02-04 DIAGNOSIS — K621 Rectal polyp: Secondary | ICD-10-CM | POA: Diagnosis not present

## 2017-02-04 DIAGNOSIS — Z880 Allergy status to penicillin: Secondary | ICD-10-CM | POA: Diagnosis not present

## 2017-02-04 DIAGNOSIS — Z86010 Personal history of colonic polyps: Secondary | ICD-10-CM | POA: Diagnosis not present

## 2017-02-04 DIAGNOSIS — D126 Benign neoplasm of colon, unspecified: Secondary | ICD-10-CM | POA: Diagnosis not present

## 2017-02-04 DIAGNOSIS — E119 Type 2 diabetes mellitus without complications: Secondary | ICD-10-CM | POA: Diagnosis not present

## 2017-02-04 DIAGNOSIS — I252 Old myocardial infarction: Secondary | ICD-10-CM | POA: Diagnosis not present

## 2017-02-04 DIAGNOSIS — Z886 Allergy status to analgesic agent status: Secondary | ICD-10-CM | POA: Diagnosis not present

## 2017-02-04 DIAGNOSIS — K635 Polyp of colon: Secondary | ICD-10-CM | POA: Diagnosis not present

## 2017-02-04 DIAGNOSIS — Z91018 Allergy to other foods: Secondary | ICD-10-CM | POA: Diagnosis not present

## 2017-02-04 DIAGNOSIS — F17200 Nicotine dependence, unspecified, uncomplicated: Secondary | ICD-10-CM | POA: Diagnosis not present

## 2017-02-04 DIAGNOSIS — E7800 Pure hypercholesterolemia, unspecified: Secondary | ICD-10-CM | POA: Diagnosis not present

## 2017-02-04 DIAGNOSIS — K5730 Diverticulosis of large intestine without perforation or abscess without bleeding: Secondary | ICD-10-CM | POA: Diagnosis not present

## 2017-02-04 DIAGNOSIS — D128 Benign neoplasm of rectum: Secondary | ICD-10-CM | POA: Diagnosis not present

## 2017-02-04 DIAGNOSIS — F329 Major depressive disorder, single episode, unspecified: Secondary | ICD-10-CM | POA: Diagnosis not present

## 2017-02-04 DIAGNOSIS — I10 Essential (primary) hypertension: Secondary | ICD-10-CM | POA: Diagnosis not present

## 2017-02-05 DIAGNOSIS — Z299 Encounter for prophylactic measures, unspecified: Secondary | ICD-10-CM | POA: Diagnosis not present

## 2017-02-05 DIAGNOSIS — M549 Dorsalgia, unspecified: Secondary | ICD-10-CM | POA: Diagnosis not present

## 2017-02-05 DIAGNOSIS — E1122 Type 2 diabetes mellitus with diabetic chronic kidney disease: Secondary | ICD-10-CM | POA: Diagnosis not present

## 2017-02-05 DIAGNOSIS — I10 Essential (primary) hypertension: Secondary | ICD-10-CM | POA: Diagnosis not present

## 2017-02-05 DIAGNOSIS — F17210 Nicotine dependence, cigarettes, uncomplicated: Secondary | ICD-10-CM | POA: Diagnosis not present

## 2017-02-05 DIAGNOSIS — F419 Anxiety disorder, unspecified: Secondary | ICD-10-CM | POA: Diagnosis not present

## 2017-02-05 DIAGNOSIS — Z6826 Body mass index (BMI) 26.0-26.9, adult: Secondary | ICD-10-CM | POA: Diagnosis not present

## 2017-02-05 DIAGNOSIS — E1165 Type 2 diabetes mellitus with hyperglycemia: Secondary | ICD-10-CM | POA: Diagnosis not present

## 2017-02-05 DIAGNOSIS — N183 Chronic kidney disease, stage 3 (moderate): Secondary | ICD-10-CM | POA: Diagnosis not present

## 2017-02-05 DIAGNOSIS — E7800 Pure hypercholesterolemia, unspecified: Secondary | ICD-10-CM | POA: Diagnosis not present

## 2017-02-24 DIAGNOSIS — Z23 Encounter for immunization: Secondary | ICD-10-CM | POA: Diagnosis not present

## 2017-02-24 DIAGNOSIS — D126 Benign neoplasm of colon, unspecified: Secondary | ICD-10-CM | POA: Diagnosis not present

## 2017-04-13 DIAGNOSIS — Z6826 Body mass index (BMI) 26.0-26.9, adult: Secondary | ICD-10-CM | POA: Diagnosis not present

## 2017-04-13 DIAGNOSIS — E1165 Type 2 diabetes mellitus with hyperglycemia: Secondary | ICD-10-CM | POA: Diagnosis not present

## 2017-04-13 DIAGNOSIS — M549 Dorsalgia, unspecified: Secondary | ICD-10-CM | POA: Diagnosis not present

## 2017-04-13 DIAGNOSIS — N183 Chronic kidney disease, stage 3 (moderate): Secondary | ICD-10-CM | POA: Diagnosis not present

## 2017-04-13 DIAGNOSIS — Z299 Encounter for prophylactic measures, unspecified: Secondary | ICD-10-CM | POA: Diagnosis not present

## 2017-04-13 DIAGNOSIS — M461 Sacroiliitis, not elsewhere classified: Secondary | ICD-10-CM | POA: Diagnosis not present

## 2017-04-13 DIAGNOSIS — E1122 Type 2 diabetes mellitus with diabetic chronic kidney disease: Secondary | ICD-10-CM | POA: Diagnosis not present

## 2017-04-22 DIAGNOSIS — E1165 Type 2 diabetes mellitus with hyperglycemia: Secondary | ICD-10-CM | POA: Diagnosis not present

## 2017-04-22 DIAGNOSIS — M461 Sacroiliitis, not elsewhere classified: Secondary | ICD-10-CM | POA: Diagnosis not present

## 2017-04-22 DIAGNOSIS — E1122 Type 2 diabetes mellitus with diabetic chronic kidney disease: Secondary | ICD-10-CM | POA: Diagnosis not present

## 2017-04-22 DIAGNOSIS — Z6826 Body mass index (BMI) 26.0-26.9, adult: Secondary | ICD-10-CM | POA: Diagnosis not present

## 2017-04-22 DIAGNOSIS — N183 Chronic kidney disease, stage 3 (moderate): Secondary | ICD-10-CM | POA: Diagnosis not present

## 2017-04-22 DIAGNOSIS — Z299 Encounter for prophylactic measures, unspecified: Secondary | ICD-10-CM | POA: Diagnosis not present

## 2017-04-22 DIAGNOSIS — M549 Dorsalgia, unspecified: Secondary | ICD-10-CM | POA: Diagnosis not present

## 2017-06-08 DIAGNOSIS — Z79899 Other long term (current) drug therapy: Secondary | ICD-10-CM | POA: Diagnosis not present

## 2017-06-08 DIAGNOSIS — M5416 Radiculopathy, lumbar region: Secondary | ICD-10-CM | POA: Diagnosis not present

## 2017-07-02 DIAGNOSIS — G894 Chronic pain syndrome: Secondary | ICD-10-CM | POA: Diagnosis not present

## 2017-07-02 DIAGNOSIS — M5136 Other intervertebral disc degeneration, lumbar region: Secondary | ICD-10-CM | POA: Diagnosis not present

## 2017-07-02 DIAGNOSIS — M545 Low back pain: Secondary | ICD-10-CM | POA: Diagnosis not present

## 2017-07-02 DIAGNOSIS — M47816 Spondylosis without myelopathy or radiculopathy, lumbar region: Secondary | ICD-10-CM | POA: Diagnosis not present

## 2017-07-02 DIAGNOSIS — M9983 Other biomechanical lesions of lumbar region: Secondary | ICD-10-CM | POA: Diagnosis not present

## 2017-07-13 DIAGNOSIS — M47816 Spondylosis without myelopathy or radiculopathy, lumbar region: Secondary | ICD-10-CM | POA: Diagnosis not present

## 2017-07-13 DIAGNOSIS — M9983 Other biomechanical lesions of lumbar region: Secondary | ICD-10-CM | POA: Diagnosis not present

## 2017-07-13 DIAGNOSIS — M5136 Other intervertebral disc degeneration, lumbar region: Secondary | ICD-10-CM | POA: Diagnosis not present

## 2017-07-13 DIAGNOSIS — R262 Difficulty in walking, not elsewhere classified: Secondary | ICD-10-CM | POA: Diagnosis not present

## 2017-07-13 DIAGNOSIS — M6281 Muscle weakness (generalized): Secondary | ICD-10-CM | POA: Diagnosis not present

## 2017-07-14 DIAGNOSIS — J069 Acute upper respiratory infection, unspecified: Secondary | ICD-10-CM | POA: Diagnosis not present

## 2017-07-14 DIAGNOSIS — Z87891 Personal history of nicotine dependence: Secondary | ICD-10-CM | POA: Diagnosis not present

## 2017-07-14 DIAGNOSIS — N183 Chronic kidney disease, stage 3 (moderate): Secondary | ICD-10-CM | POA: Diagnosis not present

## 2017-07-14 DIAGNOSIS — Z6827 Body mass index (BMI) 27.0-27.9, adult: Secondary | ICD-10-CM | POA: Diagnosis not present

## 2017-07-14 DIAGNOSIS — E1122 Type 2 diabetes mellitus with diabetic chronic kidney disease: Secondary | ICD-10-CM | POA: Diagnosis not present

## 2017-07-14 DIAGNOSIS — M549 Dorsalgia, unspecified: Secondary | ICD-10-CM | POA: Diagnosis not present

## 2017-07-14 DIAGNOSIS — Z299 Encounter for prophylactic measures, unspecified: Secondary | ICD-10-CM | POA: Diagnosis not present

## 2017-07-14 DIAGNOSIS — E1165 Type 2 diabetes mellitus with hyperglycemia: Secondary | ICD-10-CM | POA: Diagnosis not present

## 2017-07-20 DIAGNOSIS — M47816 Spondylosis without myelopathy or radiculopathy, lumbar region: Secondary | ICD-10-CM | POA: Diagnosis not present

## 2017-07-20 DIAGNOSIS — R262 Difficulty in walking, not elsewhere classified: Secondary | ICD-10-CM | POA: Diagnosis not present

## 2017-07-20 DIAGNOSIS — M5136 Other intervertebral disc degeneration, lumbar region: Secondary | ICD-10-CM | POA: Diagnosis not present

## 2017-07-20 DIAGNOSIS — M9983 Other biomechanical lesions of lumbar region: Secondary | ICD-10-CM | POA: Diagnosis not present

## 2017-07-20 DIAGNOSIS — M6281 Muscle weakness (generalized): Secondary | ICD-10-CM | POA: Diagnosis not present

## 2017-07-22 DIAGNOSIS — M9983 Other biomechanical lesions of lumbar region: Secondary | ICD-10-CM | POA: Diagnosis not present

## 2017-07-22 DIAGNOSIS — M47816 Spondylosis without myelopathy or radiculopathy, lumbar region: Secondary | ICD-10-CM | POA: Diagnosis not present

## 2017-07-22 DIAGNOSIS — M5136 Other intervertebral disc degeneration, lumbar region: Secondary | ICD-10-CM | POA: Diagnosis not present

## 2017-07-22 DIAGNOSIS — R262 Difficulty in walking, not elsewhere classified: Secondary | ICD-10-CM | POA: Diagnosis not present

## 2017-07-22 DIAGNOSIS — M6281 Muscle weakness (generalized): Secondary | ICD-10-CM | POA: Diagnosis not present

## 2017-07-27 DIAGNOSIS — M47816 Spondylosis without myelopathy or radiculopathy, lumbar region: Secondary | ICD-10-CM | POA: Diagnosis not present

## 2017-07-27 DIAGNOSIS — M6281 Muscle weakness (generalized): Secondary | ICD-10-CM | POA: Diagnosis not present

## 2017-07-27 DIAGNOSIS — R262 Difficulty in walking, not elsewhere classified: Secondary | ICD-10-CM | POA: Diagnosis not present

## 2017-07-27 DIAGNOSIS — M5136 Other intervertebral disc degeneration, lumbar region: Secondary | ICD-10-CM | POA: Diagnosis not present

## 2017-07-27 DIAGNOSIS — M9983 Other biomechanical lesions of lumbar region: Secondary | ICD-10-CM | POA: Diagnosis not present

## 2017-07-28 DIAGNOSIS — M47816 Spondylosis without myelopathy or radiculopathy, lumbar region: Secondary | ICD-10-CM | POA: Diagnosis not present

## 2017-07-28 DIAGNOSIS — M9983 Other biomechanical lesions of lumbar region: Secondary | ICD-10-CM | POA: Diagnosis not present

## 2017-07-28 DIAGNOSIS — R262 Difficulty in walking, not elsewhere classified: Secondary | ICD-10-CM | POA: Diagnosis not present

## 2017-07-28 DIAGNOSIS — M5136 Other intervertebral disc degeneration, lumbar region: Secondary | ICD-10-CM | POA: Diagnosis not present

## 2017-07-28 DIAGNOSIS — M6281 Muscle weakness (generalized): Secondary | ICD-10-CM | POA: Diagnosis not present

## 2017-07-29 DIAGNOSIS — Z79891 Long term (current) use of opiate analgesic: Secondary | ICD-10-CM | POA: Diagnosis not present

## 2017-07-29 DIAGNOSIS — M4306 Spondylolysis, lumbar region: Secondary | ICD-10-CM | POA: Diagnosis not present

## 2017-07-29 DIAGNOSIS — M5136 Other intervertebral disc degeneration, lumbar region: Secondary | ICD-10-CM | POA: Diagnosis not present

## 2017-08-03 DIAGNOSIS — M47816 Spondylosis without myelopathy or radiculopathy, lumbar region: Secondary | ICD-10-CM | POA: Diagnosis not present

## 2017-08-03 DIAGNOSIS — M6281 Muscle weakness (generalized): Secondary | ICD-10-CM | POA: Diagnosis not present

## 2017-08-03 DIAGNOSIS — M9983 Other biomechanical lesions of lumbar region: Secondary | ICD-10-CM | POA: Diagnosis not present

## 2017-08-03 DIAGNOSIS — R262 Difficulty in walking, not elsewhere classified: Secondary | ICD-10-CM | POA: Diagnosis not present

## 2017-08-03 DIAGNOSIS — M5136 Other intervertebral disc degeneration, lumbar region: Secondary | ICD-10-CM | POA: Diagnosis not present

## 2017-08-06 DIAGNOSIS — M47816 Spondylosis without myelopathy or radiculopathy, lumbar region: Secondary | ICD-10-CM | POA: Diagnosis not present

## 2017-08-06 DIAGNOSIS — M9983 Other biomechanical lesions of lumbar region: Secondary | ICD-10-CM | POA: Diagnosis not present

## 2017-08-06 DIAGNOSIS — M6281 Muscle weakness (generalized): Secondary | ICD-10-CM | POA: Diagnosis not present

## 2017-08-06 DIAGNOSIS — M5136 Other intervertebral disc degeneration, lumbar region: Secondary | ICD-10-CM | POA: Diagnosis not present

## 2017-08-06 DIAGNOSIS — R262 Difficulty in walking, not elsewhere classified: Secondary | ICD-10-CM | POA: Diagnosis not present

## 2017-09-22 DIAGNOSIS — M545 Low back pain: Secondary | ICD-10-CM | POA: Diagnosis not present

## 2017-09-22 DIAGNOSIS — Z79891 Long term (current) use of opiate analgesic: Secondary | ICD-10-CM | POA: Diagnosis not present

## 2017-09-22 DIAGNOSIS — M5136 Other intervertebral disc degeneration, lumbar region: Secondary | ICD-10-CM | POA: Diagnosis not present

## 2017-09-22 DIAGNOSIS — M47816 Spondylosis without myelopathy or radiculopathy, lumbar region: Secondary | ICD-10-CM | POA: Diagnosis not present

## 2017-09-22 DIAGNOSIS — M9983 Other biomechanical lesions of lumbar region: Secondary | ICD-10-CM | POA: Diagnosis not present

## 2017-09-22 DIAGNOSIS — G894 Chronic pain syndrome: Secondary | ICD-10-CM | POA: Diagnosis not present

## 2017-10-20 DIAGNOSIS — I10 Essential (primary) hypertension: Secondary | ICD-10-CM | POA: Diagnosis not present

## 2017-10-20 DIAGNOSIS — J019 Acute sinusitis, unspecified: Secondary | ICD-10-CM | POA: Diagnosis not present

## 2017-10-20 DIAGNOSIS — Z6827 Body mass index (BMI) 27.0-27.9, adult: Secondary | ICD-10-CM | POA: Diagnosis not present

## 2017-10-20 DIAGNOSIS — E1122 Type 2 diabetes mellitus with diabetic chronic kidney disease: Secondary | ICD-10-CM | POA: Diagnosis not present

## 2017-10-20 DIAGNOSIS — E1165 Type 2 diabetes mellitus with hyperglycemia: Secondary | ICD-10-CM | POA: Diagnosis not present

## 2017-10-20 DIAGNOSIS — Z299 Encounter for prophylactic measures, unspecified: Secondary | ICD-10-CM | POA: Diagnosis not present

## 2017-10-20 DIAGNOSIS — N183 Chronic kidney disease, stage 3 (moderate): Secondary | ICD-10-CM | POA: Diagnosis not present

## 2017-11-02 DIAGNOSIS — Z1231 Encounter for screening mammogram for malignant neoplasm of breast: Secondary | ICD-10-CM | POA: Diagnosis not present

## 2017-11-10 DIAGNOSIS — R5383 Other fatigue: Secondary | ICD-10-CM | POA: Diagnosis not present

## 2017-11-10 DIAGNOSIS — Z1339 Encounter for screening examination for other mental health and behavioral disorders: Secondary | ICD-10-CM | POA: Diagnosis not present

## 2017-11-10 DIAGNOSIS — Z1331 Encounter for screening for depression: Secondary | ICD-10-CM | POA: Diagnosis not present

## 2017-11-10 DIAGNOSIS — E1165 Type 2 diabetes mellitus with hyperglycemia: Secondary | ICD-10-CM | POA: Diagnosis not present

## 2017-11-10 DIAGNOSIS — I10 Essential (primary) hypertension: Secondary | ICD-10-CM | POA: Diagnosis not present

## 2017-11-10 DIAGNOSIS — E559 Vitamin D deficiency, unspecified: Secondary | ICD-10-CM | POA: Diagnosis not present

## 2017-11-10 DIAGNOSIS — Z0000 Encounter for general adult medical examination without abnormal findings: Secondary | ICD-10-CM | POA: Diagnosis not present

## 2017-11-10 DIAGNOSIS — Z6827 Body mass index (BMI) 27.0-27.9, adult: Secondary | ICD-10-CM | POA: Diagnosis not present

## 2017-11-10 DIAGNOSIS — I2510 Atherosclerotic heart disease of native coronary artery without angina pectoris: Secondary | ICD-10-CM | POA: Diagnosis not present

## 2017-11-10 DIAGNOSIS — E7800 Pure hypercholesterolemia, unspecified: Secondary | ICD-10-CM | POA: Diagnosis not present

## 2017-11-10 DIAGNOSIS — Z7189 Other specified counseling: Secondary | ICD-10-CM | POA: Diagnosis not present

## 2017-11-10 DIAGNOSIS — Z79899 Other long term (current) drug therapy: Secondary | ICD-10-CM | POA: Diagnosis not present

## 2017-11-10 DIAGNOSIS — J309 Allergic rhinitis, unspecified: Secondary | ICD-10-CM | POA: Diagnosis not present

## 2017-11-10 DIAGNOSIS — Z1211 Encounter for screening for malignant neoplasm of colon: Secondary | ICD-10-CM | POA: Diagnosis not present

## 2017-11-10 DIAGNOSIS — Z299 Encounter for prophylactic measures, unspecified: Secondary | ICD-10-CM | POA: Diagnosis not present

## 2017-12-11 ENCOUNTER — Other Ambulatory Visit: Payer: Self-pay | Admitting: Cardiovascular Disease

## 2017-12-13 DIAGNOSIS — M545 Low back pain: Secondary | ICD-10-CM | POA: Diagnosis not present

## 2017-12-13 DIAGNOSIS — Z79891 Long term (current) use of opiate analgesic: Secondary | ICD-10-CM | POA: Diagnosis not present

## 2017-12-13 DIAGNOSIS — M5136 Other intervertebral disc degeneration, lumbar region: Secondary | ICD-10-CM | POA: Diagnosis not present

## 2017-12-13 DIAGNOSIS — M9983 Other biomechanical lesions of lumbar region: Secondary | ICD-10-CM | POA: Diagnosis not present

## 2017-12-13 DIAGNOSIS — M47816 Spondylosis without myelopathy or radiculopathy, lumbar region: Secondary | ICD-10-CM | POA: Diagnosis not present

## 2017-12-13 DIAGNOSIS — G894 Chronic pain syndrome: Secondary | ICD-10-CM | POA: Diagnosis not present

## 2017-12-29 DIAGNOSIS — H52223 Regular astigmatism, bilateral: Secondary | ICD-10-CM | POA: Diagnosis not present

## 2017-12-29 DIAGNOSIS — H5089 Other specified strabismus: Secondary | ICD-10-CM | POA: Diagnosis not present

## 2017-12-29 DIAGNOSIS — H35451 Secondary pigmentary degeneration, right eye: Secondary | ICD-10-CM | POA: Diagnosis not present

## 2017-12-29 DIAGNOSIS — E119 Type 2 diabetes mellitus without complications: Secondary | ICD-10-CM | POA: Diagnosis not present

## 2017-12-29 DIAGNOSIS — H5203 Hypermetropia, bilateral: Secondary | ICD-10-CM | POA: Diagnosis not present

## 2017-12-29 DIAGNOSIS — H5000 Unspecified esotropia: Secondary | ICD-10-CM | POA: Diagnosis not present

## 2018-01-06 DIAGNOSIS — M5136 Other intervertebral disc degeneration, lumbar region: Secondary | ICD-10-CM | POA: Diagnosis not present

## 2018-01-06 DIAGNOSIS — M47816 Spondylosis without myelopathy or radiculopathy, lumbar region: Secondary | ICD-10-CM | POA: Diagnosis not present

## 2018-01-18 DIAGNOSIS — Z6827 Body mass index (BMI) 27.0-27.9, adult: Secondary | ICD-10-CM | POA: Diagnosis not present

## 2018-01-18 DIAGNOSIS — J069 Acute upper respiratory infection, unspecified: Secondary | ICD-10-CM | POA: Diagnosis not present

## 2018-01-18 DIAGNOSIS — E1165 Type 2 diabetes mellitus with hyperglycemia: Secondary | ICD-10-CM | POA: Diagnosis not present

## 2018-01-18 DIAGNOSIS — I10 Essential (primary) hypertension: Secondary | ICD-10-CM | POA: Diagnosis not present

## 2018-01-18 DIAGNOSIS — E7800 Pure hypercholesterolemia, unspecified: Secondary | ICD-10-CM | POA: Diagnosis not present

## 2018-01-18 DIAGNOSIS — Z299 Encounter for prophylactic measures, unspecified: Secondary | ICD-10-CM | POA: Diagnosis not present

## 2018-02-03 DIAGNOSIS — M19012 Primary osteoarthritis, left shoulder: Secondary | ICD-10-CM | POA: Diagnosis not present

## 2018-02-03 DIAGNOSIS — G8929 Other chronic pain: Secondary | ICD-10-CM | POA: Diagnosis not present

## 2018-02-03 DIAGNOSIS — M5136 Other intervertebral disc degeneration, lumbar region: Secondary | ICD-10-CM | POA: Diagnosis not present

## 2018-02-03 DIAGNOSIS — M25512 Pain in left shoulder: Secondary | ICD-10-CM | POA: Diagnosis not present

## 2018-02-03 DIAGNOSIS — G894 Chronic pain syndrome: Secondary | ICD-10-CM | POA: Diagnosis not present

## 2018-02-11 DIAGNOSIS — Z23 Encounter for immunization: Secondary | ICD-10-CM | POA: Diagnosis not present

## 2018-03-09 DIAGNOSIS — E1165 Type 2 diabetes mellitus with hyperglycemia: Secondary | ICD-10-CM | POA: Diagnosis not present

## 2018-03-09 DIAGNOSIS — I10 Essential (primary) hypertension: Secondary | ICD-10-CM | POA: Diagnosis not present

## 2018-03-09 DIAGNOSIS — E1122 Type 2 diabetes mellitus with diabetic chronic kidney disease: Secondary | ICD-10-CM | POA: Diagnosis not present

## 2018-03-09 DIAGNOSIS — N183 Chronic kidney disease, stage 3 (moderate): Secondary | ICD-10-CM | POA: Diagnosis not present

## 2018-03-09 DIAGNOSIS — M461 Sacroiliitis, not elsewhere classified: Secondary | ICD-10-CM | POA: Diagnosis not present

## 2018-03-09 DIAGNOSIS — Z299 Encounter for prophylactic measures, unspecified: Secondary | ICD-10-CM | POA: Diagnosis not present

## 2018-03-09 DIAGNOSIS — Z6827 Body mass index (BMI) 27.0-27.9, adult: Secondary | ICD-10-CM | POA: Diagnosis not present

## 2018-03-22 DIAGNOSIS — E119 Type 2 diabetes mellitus without complications: Secondary | ICD-10-CM | POA: Diagnosis not present

## 2018-03-22 DIAGNOSIS — I10 Essential (primary) hypertension: Secondary | ICD-10-CM | POA: Diagnosis not present

## 2018-03-28 DIAGNOSIS — M5136 Other intervertebral disc degeneration, lumbar region: Secondary | ICD-10-CM | POA: Diagnosis not present

## 2018-04-11 DIAGNOSIS — I10 Essential (primary) hypertension: Secondary | ICD-10-CM | POA: Diagnosis not present

## 2018-04-11 DIAGNOSIS — E119 Type 2 diabetes mellitus without complications: Secondary | ICD-10-CM | POA: Diagnosis not present

## 2018-04-22 DIAGNOSIS — Z6827 Body mass index (BMI) 27.0-27.9, adult: Secondary | ICD-10-CM | POA: Diagnosis not present

## 2018-04-22 DIAGNOSIS — E1122 Type 2 diabetes mellitus with diabetic chronic kidney disease: Secondary | ICD-10-CM | POA: Diagnosis not present

## 2018-04-22 DIAGNOSIS — Z299 Encounter for prophylactic measures, unspecified: Secondary | ICD-10-CM | POA: Diagnosis not present

## 2018-04-22 DIAGNOSIS — J069 Acute upper respiratory infection, unspecified: Secondary | ICD-10-CM | POA: Diagnosis not present

## 2018-04-22 DIAGNOSIS — I10 Essential (primary) hypertension: Secondary | ICD-10-CM | POA: Diagnosis not present

## 2018-04-22 DIAGNOSIS — N183 Chronic kidney disease, stage 3 (moderate): Secondary | ICD-10-CM | POA: Diagnosis not present

## 2018-04-22 DIAGNOSIS — E1165 Type 2 diabetes mellitus with hyperglycemia: Secondary | ICD-10-CM | POA: Diagnosis not present

## 2018-05-11 DIAGNOSIS — G479 Sleep disorder, unspecified: Secondary | ICD-10-CM | POA: Diagnosis not present

## 2018-05-11 DIAGNOSIS — E119 Type 2 diabetes mellitus without complications: Secondary | ICD-10-CM | POA: Diagnosis not present

## 2018-05-11 DIAGNOSIS — E162 Hypoglycemia, unspecified: Secondary | ICD-10-CM | POA: Diagnosis not present

## 2018-05-11 DIAGNOSIS — Z7984 Long term (current) use of oral hypoglycemic drugs: Secondary | ICD-10-CM | POA: Diagnosis not present

## 2018-05-11 DIAGNOSIS — F19921 Other psychoactive substance use, unspecified with intoxication with delirium: Secondary | ICD-10-CM | POA: Diagnosis not present

## 2018-05-11 DIAGNOSIS — G253 Myoclonus: Secondary | ICD-10-CM | POA: Diagnosis not present

## 2018-05-11 DIAGNOSIS — Z885 Allergy status to narcotic agent status: Secondary | ICD-10-CM | POA: Diagnosis not present

## 2018-05-11 DIAGNOSIS — M549 Dorsalgia, unspecified: Secondary | ICD-10-CM | POA: Diagnosis present

## 2018-05-11 DIAGNOSIS — Z7982 Long term (current) use of aspirin: Secondary | ICD-10-CM | POA: Diagnosis not present

## 2018-05-11 DIAGNOSIS — I69319 Unspecified symptoms and signs involving cognitive functions following cerebral infarction: Secondary | ICD-10-CM | POA: Diagnosis not present

## 2018-05-11 DIAGNOSIS — Z880 Allergy status to penicillin: Secondary | ICD-10-CM | POA: Diagnosis not present

## 2018-05-11 DIAGNOSIS — I63511 Cerebral infarction due to unspecified occlusion or stenosis of right middle cerebral artery: Secondary | ICD-10-CM | POA: Diagnosis not present

## 2018-05-11 DIAGNOSIS — Z882 Allergy status to sulfonamides status: Secondary | ICD-10-CM | POA: Diagnosis not present

## 2018-05-11 DIAGNOSIS — Z7901 Long term (current) use of anticoagulants: Secondary | ICD-10-CM | POA: Diagnosis not present

## 2018-05-11 DIAGNOSIS — I6330 Cerebral infarction due to thrombosis of unspecified cerebral artery: Secondary | ICD-10-CM | POA: Diagnosis not present

## 2018-05-11 DIAGNOSIS — I6310 Cerebral infarction due to embolism of unspecified precerebral artery: Secondary | ICD-10-CM | POA: Diagnosis not present

## 2018-05-11 DIAGNOSIS — J439 Emphysema, unspecified: Secondary | ICD-10-CM | POA: Diagnosis not present

## 2018-05-11 DIAGNOSIS — I69398 Other sequelae of cerebral infarction: Secondary | ICD-10-CM | POA: Diagnosis not present

## 2018-05-11 DIAGNOSIS — G8324 Monoplegia of upper limb affecting left nondominant side: Secondary | ICD-10-CM | POA: Diagnosis not present

## 2018-05-11 DIAGNOSIS — I2510 Atherosclerotic heart disease of native coronary artery without angina pectoris: Secondary | ICD-10-CM | POA: Diagnosis not present

## 2018-05-11 DIAGNOSIS — Z955 Presence of coronary angioplasty implant and graft: Secondary | ICD-10-CM | POA: Diagnosis not present

## 2018-05-11 DIAGNOSIS — E11649 Type 2 diabetes mellitus with hypoglycemia without coma: Secondary | ICD-10-CM | POA: Diagnosis present

## 2018-05-11 DIAGNOSIS — S0990XA Unspecified injury of head, initial encounter: Secondary | ICD-10-CM | POA: Diagnosis not present

## 2018-05-11 DIAGNOSIS — R410 Disorientation, unspecified: Secondary | ICD-10-CM | POA: Diagnosis not present

## 2018-05-11 DIAGNOSIS — I63411 Cerebral infarction due to embolism of right middle cerebral artery: Secondary | ICD-10-CM | POA: Diagnosis not present

## 2018-05-11 DIAGNOSIS — I639 Cerebral infarction, unspecified: Secondary | ICD-10-CM | POA: Diagnosis not present

## 2018-05-11 DIAGNOSIS — R414 Neurologic neglect syndrome: Secondary | ICD-10-CM | POA: Diagnosis not present

## 2018-05-11 DIAGNOSIS — I359 Nonrheumatic aortic valve disorder, unspecified: Secondary | ICD-10-CM | POA: Diagnosis not present

## 2018-05-11 DIAGNOSIS — E785 Hyperlipidemia, unspecified: Secondary | ICD-10-CM | POA: Diagnosis not present

## 2018-05-11 DIAGNOSIS — I69393 Ataxia following cerebral infarction: Secondary | ICD-10-CM | POA: Diagnosis not present

## 2018-05-11 DIAGNOSIS — Z888 Allergy status to other drugs, medicaments and biological substances status: Secondary | ICD-10-CM | POA: Diagnosis not present

## 2018-05-11 DIAGNOSIS — S60222A Contusion of left hand, initial encounter: Secondary | ICD-10-CM | POA: Diagnosis not present

## 2018-05-11 DIAGNOSIS — R29703 NIHSS score 3: Secondary | ICD-10-CM | POA: Diagnosis not present

## 2018-05-11 DIAGNOSIS — G894 Chronic pain syndrome: Secondary | ICD-10-CM | POA: Diagnosis present

## 2018-05-11 DIAGNOSIS — Z90710 Acquired absence of both cervix and uterus: Secondary | ICD-10-CM | POA: Diagnosis not present

## 2018-05-11 DIAGNOSIS — F17200 Nicotine dependence, unspecified, uncomplicated: Secondary | ICD-10-CM | POA: Diagnosis not present

## 2018-05-11 DIAGNOSIS — Z91018 Allergy to other foods: Secondary | ICD-10-CM | POA: Diagnosis not present

## 2018-05-11 DIAGNOSIS — Z79899 Other long term (current) drug therapy: Secondary | ICD-10-CM | POA: Diagnosis not present

## 2018-05-11 DIAGNOSIS — I10 Essential (primary) hypertension: Secondary | ICD-10-CM | POA: Diagnosis not present

## 2018-05-11 DIAGNOSIS — I358 Other nonrheumatic aortic valve disorders: Secondary | ICD-10-CM | POA: Diagnosis not present

## 2018-05-11 DIAGNOSIS — R4182 Altered mental status, unspecified: Secondary | ICD-10-CM | POA: Diagnosis not present

## 2018-05-11 DIAGNOSIS — R200 Anesthesia of skin: Secondary | ICD-10-CM | POA: Diagnosis not present

## 2018-05-11 DIAGNOSIS — G4700 Insomnia, unspecified: Secondary | ICD-10-CM | POA: Diagnosis not present

## 2018-05-11 DIAGNOSIS — R209 Unspecified disturbances of skin sensation: Secondary | ICD-10-CM | POA: Diagnosis not present

## 2018-05-11 DIAGNOSIS — Z8249 Family history of ischemic heart disease and other diseases of the circulatory system: Secondary | ICD-10-CM | POA: Diagnosis not present

## 2018-05-11 DIAGNOSIS — I69354 Hemiplegia and hemiparesis following cerebral infarction affecting left non-dominant side: Secondary | ICD-10-CM | POA: Diagnosis present

## 2018-05-11 DIAGNOSIS — F17210 Nicotine dependence, cigarettes, uncomplicated: Secondary | ICD-10-CM | POA: Diagnosis present

## 2018-05-11 DIAGNOSIS — I252 Old myocardial infarction: Secondary | ICD-10-CM | POA: Diagnosis not present

## 2018-05-11 DIAGNOSIS — M545 Low back pain: Secondary | ICD-10-CM | POA: Diagnosis not present

## 2018-05-11 DIAGNOSIS — I059 Rheumatic mitral valve disease, unspecified: Secondary | ICD-10-CM | POA: Diagnosis not present

## 2018-05-11 DIAGNOSIS — G8929 Other chronic pain: Secondary | ICD-10-CM | POA: Diagnosis not present

## 2018-05-11 DIAGNOSIS — Z91013 Allergy to seafood: Secondary | ICD-10-CM | POA: Diagnosis not present

## 2018-05-11 DIAGNOSIS — R7309 Other abnormal glucose: Secondary | ICD-10-CM | POA: Diagnosis not present

## 2018-05-11 DIAGNOSIS — M1990 Unspecified osteoarthritis, unspecified site: Secondary | ICD-10-CM | POA: Diagnosis present

## 2018-05-11 DIAGNOSIS — W010XXA Fall on same level from slipping, tripping and stumbling without subsequent striking against object, initial encounter: Secondary | ICD-10-CM | POA: Diagnosis not present

## 2018-05-11 DIAGNOSIS — D72829 Elevated white blood cell count, unspecified: Secondary | ICD-10-CM | POA: Diagnosis not present

## 2018-05-11 DIAGNOSIS — R791 Abnormal coagulation profile: Secondary | ICD-10-CM | POA: Diagnosis not present

## 2018-05-11 HISTORY — DX: Cerebral infarction, unspecified: I63.9

## 2018-05-17 ENCOUNTER — Encounter: Payer: Self-pay | Admitting: Occupational Therapy

## 2018-05-17 NOTE — PMR Pre-admission (Signed)
[facility] PMR Admission Coordinator Pre-Admission Assessment  Patient: Rachel Perkins is an 68 y.o., female MRN: [id number] DOB: Feb 18, 1951 Height: _0  (162.6 cm) Weight: 64.4 kg  Insurance Information HMO:   PPO:      PCP:      IPA:      80/20: Yes     OTHER:  PRIMARY: Medicare A and B      Policy#: [id number]      Subscriber: Patient CM Name:       Phone#:      Fax#:  Pre-Cert#:       Employer:  Benefits:  Phone #:      Name: verified eligibility via [place] on 05/17/18 Eff. Date: part A effective 05/11/13; part B effective 05/11/13     Deduct: $1,408.00      Out of Pocket Max: NA      Life Max: NA CIR: Covered per Medicare guidelines once yearly deductible is met      SNF: days 1-20, 100%; days 21-100, 80% Outpatient: 80%     Co-Pay: 20% Home Health: 100%      Co-Pay:  DME: 80%     Co-Pay: 20% Providers: Pt's choice SECONDARY: [employer]      Policy#: [id number]      Subscriber: Patient CM Name:       Phone#:      Fax#:  Pre-Cert#:       Employer:  Benefits:  Phone #: [phone]     Name:  Eff. Date:      Deduct:       Out of Pocket Max:       Life Max:  CIR:       SNF:  Outpatient:      Co-Pay:  Home Health:       Co-Pay:  DME:      Co-Pay:   Medicaid Application Date:       Case Manager:  Disability Application Date:       Case Worker:   Emergency Contact Information Contact Information    Name Relation Home Work Mobile   [place] Spouse [phone]        Current Medical History  Patient Admitting Diagnosis: Right MCA CVA and small L MCA Stroke.  History of Present Illness: Pt is a 68 yo F with history of CAD with cardiac stent 4 years ago and diabetes mellitus II. PTA pt was independent in ADLs and mobility, working full time in a warehouse, and residing in a 1 story home with her husband with 2 steps to enter. Pt presented to [facility] due to weakness and numbness in LUE with CT showing right MCA distribution infarct in right MCA M2 branch  occulsion. Her initial NIH was 2. Pt was not felt to be candidate for TPA or thrombectomy. Pt was transferred to [facility] for management. An MRI of the brain shows large acute infarct in the R MCA distrubtion involving the posterior temporal lobe and insular cortex, small acute infarcts in the left frontal and right parietal lobes, a small remote infarct in the left posterior parietal lobe, and moderate chronic microvascular ischemic change of the white matter. 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 The patient did have agitated delirium during hospital course but that has since resolved; Pt  also exhibited signs of non-epileptic myoclonus with full body jerking noted on 05/12/18 which has since resolved since administration of Keppra. Keppra has been discontinued as of 1/4. Pt has participated in PT and OT sessions with recommendations for CIR. Pt is to be admitted to CIR on 05/18/18.    Patient's medical record from [facility] has been reviewed by the rehabilitation admission coordinator and physician. NIH Stroke scale: _0 ([id number]) Glascow Coma Scale: )    Past Medical History  Past Medical History:  Diagnosis Date  . Anginal pain ([place])   . Arthritis   . CAD (coronary artery disease)    a. 09/25/13 s/p DES to [facility] and DES to pRCA.  . DM (diabetes mellitus) ([place])    a. diet controlled.   Marland Kitchen HTN (hypertension)   . Myocardial infarction [facility]) 09/2013   nstemi  . Tobacco abuse     Family History   family history includes Heart failure (age of onset: 42) in her father.  Prior Rehab/Hospitalizations Has the patient had major surgery during 100 days prior to admission? No    Current Medications  Current Outpatient Medications:  .  aspirin EC 81 MG tablet, Take 81 mg by mouth  daily., Disp: , Rfl:  .  atorvastatin (LIPITOR) 80 MG tablet, Take 1 tablet (80 mg total) by mouth daily at 6 PM., Disp: 30 tablet, Rfl: 11 .  meclizine (ANTIVERT) 25 MG tablet, Take 1 tablet by mouth 3 (three) times daily as needed., Disp: , Rfl:  .  metFORMIN (GLUCOPHAGE-XR) 500 MG 24 hr tablet, Take 0.5 tablets by mouth daily., Disp: , Rfl:  .  metoprolol tartrate (LOPRESSOR) 25 MG tablet, Take 1 tablet (25 mg total) by mouth 2 (two) times daily., Disp: 60 tablet, Rfl: 11 .  nitroGLYCERIN (NITROSTAT) 0.4 MG SL tablet, PLACE 1 TABLET UNDER THE TONGUE EVERY 5 MINUTES FOR 3 DOSES AS NEEDED CHEST PAIN, Disp: 25 tablet, Rfl: 0 .  sertraline (ZOLOFT) 50 MG tablet, , Disp: , Rfl:   Patients Current Diet:   Diet Order    None      Precautions / Restrictions Precautions Precautions: Fall Precautions/Special Needs: Other Precaution Comments: BUE IV (from acute); BP > 220 (from acute)   Has the patient had 2 or more falls or a fall with injury in the past year?No  Prior Activity Level Community (5-7x/wk): worked full time, Health and safety inspector; drove PTA. very active  Prior Functional Level Do you want Prior Function Level of Independence: Independent from other? Self Care: Did the patient need help bathing, dressing, using the toilet or eating?  Independent  Indoor Mobility: Did the patient need assistance with walking from room to room (with or without device)? Independent  Stairs: Did the patient need assistance with internal or external stairs (with or without device)? Independent  Functional Cognition: Did the patient need help planning regular tasks such as shopping or remembering to take medications? Independent  Home Assistive Devices / Equipment Home Assistive Devices/Equipment: None  Prior Device Use: Indicate devices/aids used by the patient prior to current illness, exacerbation or injury? None of the above  Prior Functional Level     Prior Functional Level Current  Functional Level  Bed Mobility Independent Supervision  Transfers Independent  Min A  Mobility - Walk/Wheelchair Independent Min A  Mobility - Ambulation/Gait Independent Min A  Upper Body Dressing Independent Min A  Lower Body Dressing Independent Max A  Grooming Independent Min A  Eating/Drinking Independent Min A  Economist Mod A  Bladder Continence Continent, Independent Continence, Max A  Bowel Management  Independent Max A  Stair Climbing Independent NT  Communication WNL Not formally assessed; WFL during phone conversation  Memory WNL Cognition assessed per OT, with noted decreased attention, poor processing, single step commands with need for redirection  Cooking/Meal Prep  Independent      Housework  Independent   Money Management  Independent   Driving  Independent     Special needs/care consideration BiPAP/CPAP: no CPM: no Continuous Drip IV: no Dialysis: no        Days: no Life Vest: no Oxygen: no Special Bed: no Trach Size: no Wound Vac (area): no      Location: no Skin: no areas of concern                            Bowel mgmt:continent, last BM: 05/15/18 Bladder mgmt: continent Diabetic mgmt: yes  Previous Home Environment Living Arrangements: Spouse/significant other  Lives With: Spouse Available Help at Discharge: Family Type of Home: House Home Layout: One level Home Access: Stairs to enter Entrance Stairs-Rails: Right, Left Entrance Stairs-Number of Steps: 2 Bathroom Shower/Tub: Optometrist: Yes How Accessible: Accessible via walker [place]: No Additional Comments: no Home services currently   Discharge Living Setting Plans for Discharge Living Setting: Patient's home, Lives with (comment) Type of Home at Discharge: House Discharge Home Layout: One level Discharge Home Access: Stairs to enter Entrance Stairs-Rails: Right, Left Entrance Stairs-Number of  Steps: 2 Discharge Bathroom Shower/Tub: Tub/shower unit Discharge Bathroom Toilet: Standard Discharge Bathroom Accessibility: Yes How Accessible: Accessible via walker Does the patient have any problems obtaining your medications?: No  Social/Family/Support Systems Patient Roles: Spouse, Other (Comment)(full time employee) Contact Information: husband (Rober): [phone] Anticipated Caregiver: husband and sons lives close by Anticipated Caregiver's Contact Information: see above Ability/Limitations of Caregiver: Min A Caregiver Availability: 24/7(close to 24/7, husband works 3.5hr/daily but can Producer, television/film/video) Discharge Plan Discussed with Primary Caregiver: Yes Is Caregiver In Agreement with Plan?: Yes Does Caregiver/Family have Issues with Lodging/Transportation while Pt is in Rehab?: No  Goals/Additional Needs Patient/Family Goal for Rehab: PT: supervision; OT: Supervision/Min A; SLP: NA Expected length of stay: 7-10 days Cultural Considerations: [employer] Dietary Needs: consistent carbs; consistent vitamin k diet; thin liquids Equipment Needs: TBD Pt/Family Agrees to Admission and willing to participate: Yes Program Orientation Provided & Reviewed with Pt/Caregiver Including Roles  & Responsibilities: Yes(pt and husband)  Barriers to Discharge: Home environment access/layout  Barriers to Discharge Comments: steps to enter  Patient Condition: I have reviewed medical records from [facility], spoken with CM, RN, and the patient and her spouse. I discussed via phone all information regarding CIR program and gathered pertinent information required for inpatient rehabilitation assessment.  Patient will benefit from ongoing PT and OT, can actively participate in 3 hours of therapy a day 5 days of the week, and can make measurable gains during the admission.  Patient will also benefit from the coordinated team approach during an Inpatient Acute Rehabilitation admission.   The patient will receive intensive therapy as well as Rehabilitation physician, nursing, social worker, and care management interventions.  Due to bowel management, bladder management, safety, disease management, medical administration, pain management, patient education the patient requires 24 hour a day rehabilitation nursing.  The patient is currently Min A with mobility and Min-Max A basic ADLs.  Discharge setting and therapy post discharge at home with home health is anticipated.  Patient has agreed to participate in the Acute Inpatient Rehabilitation Program and will admit today, 05/18/18.  Preadmission Screen Completed By:  Jhonnie Garner, 05/18/2018 8:36 AM ______________________________________________________________________   Discussed status with Dr. Posey Pronto on 05/17/18 at 12:56PM and received telephone approval for admission today.  Admission Coordinator:  Jhonnie Garner, time 8:34AM/Date 05/18/18   Assessment/Plan: Diagnosis: Right MCA CVA and small L MCA   1. Does the need for close, 24 hr/day  Medical supervision in concert with the patient's rehab needs make it unreasonable for this patient to be served in a less intensive setting? Yes  2. Co-Morbidities requiring supervision/potential complications: CAD with cardiac stent 4 years ago and diabetes mellitus II 3. Due to safety, disease management and patient education, does the patient require 24 hr/day rehab nursing? Yes 4. Does the patient require coordinated care of a physician, rehab nurse, PT (1-2 hrs/day, 5 days/week), OT (1-2 hrs/day, 5 days/week) and SLP (1-2 hrs/day, 5 days/week) to address physical and functional deficits in the context of the above medical diagnosis(es)? Yes Addressing deficits in the following areas: balance, endurance, locomotion, strength, transferring, bathing, dressing, toileting, cognition and psychosocial support 5. Can the patient actively participate in an intensive therapy program of at least 3 hrs of therapy 5  days a week? Yes 6. The potential for patient to make measurable gains while on inpatient rehab is excellent 7. Anticipated functional outcomes upon discharge from inpatients are: supervision PT, supervision and min assist OT, modified independent and supervision SLP 8. Estimated rehab length of stay to reach the above functional goals is: 7-10 days. 9. Anticipated D/C setting: Home 10. Anticipated post D/C treatments: HH therapy and Home excercise program 11. Overall Rehab/Functional Prognosis: good    RECOMMENDATIONS: This patient's condition is appropriate for continued rehabilitative care in the following setting: CIR Patient has agreed to participate in recommended program. Yes Note that insurance prior authorization may be required for reimbursement for recommended care.  Jhonnie Garner 05/18/2018  Delice Lesch, MD, ABPMR

## 2018-05-18 ENCOUNTER — Other Ambulatory Visit (HOSPITAL_COMMUNITY): Payer: Self-pay | Admitting: Physical Medicine and Rehabilitation

## 2018-05-18 ENCOUNTER — Encounter (HOSPITAL_COMMUNITY): Payer: Self-pay | Admitting: Physical Medicine and Rehabilitation

## 2018-05-18 ENCOUNTER — Inpatient Hospital Stay (HOSPITAL_COMMUNITY)
Admission: RE | Admit: 2018-05-18 | Discharge: 2018-05-27 | DRG: 057 | Disposition: A | Discharge status: Home Health | Payer: Medicare Other | Source: Intra-hospital | Attending: Physical Medicine & Rehabilitation | Admitting: Physical Medicine & Rehabilitation

## 2018-05-18 DIAGNOSIS — Z7982 Long term (current) use of aspirin: Secondary | ICD-10-CM

## 2018-05-18 DIAGNOSIS — M549 Dorsalgia, unspecified: Secondary | ICD-10-CM | POA: Diagnosis present

## 2018-05-18 DIAGNOSIS — I252 Old myocardial infarction: Secondary | ICD-10-CM | POA: Diagnosis not present

## 2018-05-18 DIAGNOSIS — I2510 Atherosclerotic heart disease of native coronary artery without angina pectoris: Secondary | ICD-10-CM | POA: Diagnosis present

## 2018-05-18 DIAGNOSIS — Z880 Allergy status to penicillin: Secondary | ICD-10-CM | POA: Diagnosis not present

## 2018-05-18 DIAGNOSIS — Z885 Allergy status to narcotic agent status: Secondary | ICD-10-CM | POA: Diagnosis not present

## 2018-05-18 DIAGNOSIS — F17210 Nicotine dependence, cigarettes, uncomplicated: Secondary | ICD-10-CM | POA: Diagnosis present

## 2018-05-18 DIAGNOSIS — Z91013 Allergy to seafood: Secondary | ICD-10-CM

## 2018-05-18 DIAGNOSIS — R791 Abnormal coagulation profile: Secondary | ICD-10-CM | POA: Diagnosis not present

## 2018-05-18 DIAGNOSIS — Z955 Presence of coronary angioplasty implant and graft: Secondary | ICD-10-CM

## 2018-05-18 DIAGNOSIS — G894 Chronic pain syndrome: Secondary | ICD-10-CM

## 2018-05-18 DIAGNOSIS — I69398 Other sequelae of cerebral infarction: Secondary | ICD-10-CM | POA: Diagnosis not present

## 2018-05-18 DIAGNOSIS — R209 Unspecified disturbances of skin sensation: Secondary | ICD-10-CM | POA: Diagnosis not present

## 2018-05-18 DIAGNOSIS — M1990 Unspecified osteoarthritis, unspecified site: Secondary | ICD-10-CM | POA: Diagnosis present

## 2018-05-18 DIAGNOSIS — I69354 Hemiplegia and hemiparesis following cerebral infarction affecting left non-dominant side: Principal | ICD-10-CM

## 2018-05-18 DIAGNOSIS — Z7984 Long term (current) use of oral hypoglycemic drugs: Secondary | ICD-10-CM

## 2018-05-18 DIAGNOSIS — Z8249 Family history of ischemic heart disease and other diseases of the circulatory system: Secondary | ICD-10-CM | POA: Diagnosis not present

## 2018-05-18 DIAGNOSIS — I69319 Unspecified symptoms and signs involving cognitive functions following cerebral infarction: Secondary | ICD-10-CM | POA: Diagnosis not present

## 2018-05-18 DIAGNOSIS — Z91018 Allergy to other foods: Secondary | ICD-10-CM

## 2018-05-18 DIAGNOSIS — Z7901 Long term (current) use of anticoagulants: Secondary | ICD-10-CM | POA: Diagnosis not present

## 2018-05-18 DIAGNOSIS — I63511 Cerebral infarction due to unspecified occlusion or stenosis of right middle cerebral artery: Secondary | ICD-10-CM | POA: Diagnosis not present

## 2018-05-18 DIAGNOSIS — I69393 Ataxia following cerebral infarction: Secondary | ICD-10-CM

## 2018-05-18 DIAGNOSIS — R7309 Other abnormal glucose: Secondary | ICD-10-CM

## 2018-05-18 DIAGNOSIS — I10 Essential (primary) hypertension: Secondary | ICD-10-CM | POA: Diagnosis not present

## 2018-05-18 DIAGNOSIS — R410 Disorientation, unspecified: Secondary | ICD-10-CM

## 2018-05-18 DIAGNOSIS — F19921 Other psychoactive substance use, unspecified with intoxication with delirium: Secondary | ICD-10-CM

## 2018-05-18 DIAGNOSIS — E11649 Type 2 diabetes mellitus with hypoglycemia without coma: Secondary | ICD-10-CM | POA: Diagnosis present

## 2018-05-18 DIAGNOSIS — R414 Neurologic neglect syndrome: Secondary | ICD-10-CM | POA: Diagnosis not present

## 2018-05-18 DIAGNOSIS — G4700 Insomnia, unspecified: Secondary | ICD-10-CM | POA: Diagnosis not present

## 2018-05-18 DIAGNOSIS — T50905A Adverse effect of unspecified drugs, medicaments and biological substances, initial encounter: Secondary | ICD-10-CM

## 2018-05-18 DIAGNOSIS — E119 Type 2 diabetes mellitus without complications: Secondary | ICD-10-CM

## 2018-05-18 DIAGNOSIS — E162 Hypoglycemia, unspecified: Secondary | ICD-10-CM

## 2018-05-18 DIAGNOSIS — Z882 Allergy status to sulfonamides status: Secondary | ICD-10-CM | POA: Diagnosis not present

## 2018-05-18 DIAGNOSIS — G479 Sleep disorder, unspecified: Secondary | ICD-10-CM

## 2018-05-18 DIAGNOSIS — Z90710 Acquired absence of both cervix and uterus: Secondary | ICD-10-CM

## 2018-05-18 HISTORY — DX: Dorsalgia, unspecified: M54.9

## 2018-05-18 HISTORY — DX: Other chronic pain: G89.29

## 2018-05-18 LAB — GLUCOSE, CAPILLARY
Glucose-Capillary: 103 mg/dL — ABNORMAL HIGH (ref 70–99)
Glucose-Capillary: 119 mg/dL — ABNORMAL HIGH (ref 70–99)

## 2018-05-18 MED ORDER — CALCIUM ACETATE (PHOS BINDER) 667 MG PO CAPS: 667.0000 mg | ORAL_CAPSULE | Freq: Every day | ORAL | Status: DC

## 2018-05-18 MED ORDER — CALCIUM ACETATE 667 MG PO CAPS
667.00 | ORAL_CAPSULE | ORAL | Status: DC
Start: 2018-05-18 — End: 2018-05-18

## 2018-05-18 MED ORDER — BETAMETHASONE DIPROPIONATE 0.05 % EX CREA
TOPICAL_CREAM | CUTANEOUS | Status: DC
Start: 2018-05-18 — End: 2018-05-18

## 2018-05-18 MED ORDER — WARFARIN SODIUM 1 MG PO TABS
ORAL_TABLET | ORAL | Status: DC
Start: ? — End: 2018-05-18

## 2018-05-18 MED ORDER — ALBUTEROL SULFATE HFA 108 (90 BASE) MCG/ACT IN AERS
2.00 | INHALATION_SPRAY | RESPIRATORY_TRACT | Status: DC
Start: ? — End: 2018-05-18

## 2018-05-18 MED ORDER — METOPROLOL TARTRATE 25 MG PO TABS
25.0000 mg | ORAL_TABLET | Freq: Two times a day (BID) | ORAL | Status: DC
  Administered 2018-05-18 – 2018-05-27 (×18): 25 mg via ORAL
  Filled 2018-05-18 (×19): qty 1

## 2018-05-18 MED ORDER — DOCUSATE SODIUM 100 MG PO CAPS
100.00 | ORAL_CAPSULE | ORAL | Status: DC
Start: 2018-05-18 — End: 2018-05-18

## 2018-05-18 MED ORDER — ENOXAPARIN SODIUM 60 MG/0.6ML ~~LOC~~ SOLN
60.00 | SUBCUTANEOUS | Status: DC
Start: 2018-05-18 — End: 2018-05-18

## 2018-05-18 MED ORDER — BISACODYL 10 MG RE SUPP: 10.0000 mg | Freq: Every day | RECTAL | Status: DC | PRN

## 2018-05-18 MED ORDER — CALCIUM ACETATE (PHOS BINDER) 667 MG PO CAPS
667.0000 mg | ORAL_CAPSULE | Freq: Three times a day (TID) | ORAL | Status: DC
  Administered 2018-05-18 – 2018-05-24 (×17): 667 mg via ORAL
  Filled 2018-05-18 (×19): qty 1

## 2018-05-18 MED ORDER — MECLIZINE HCL 25 MG PO TABS
25.00 | ORAL_TABLET | ORAL | Status: DC
Start: ? — End: 2018-05-18

## 2018-05-18 MED ORDER — WARFARIN SODIUM 5 MG PO TABS
5.00 | ORAL_TABLET | ORAL | Status: DC
Start: 2018-05-18 — End: 2018-05-18

## 2018-05-18 MED ORDER — ASPIRIN 81 MG PO CHEW
81.00 | CHEWABLE_TABLET | ORAL | Status: DC
Start: 2018-05-19 — End: 2018-05-18

## 2018-05-18 MED ORDER — WARFARIN - PHARMACIST DOSING INPATIENT
Freq: Every day | Status: DC
  Administered 2018-05-20 – 2018-05-26 (×3)

## 2018-05-18 MED ORDER — METOPROLOL TARTRATE 25 MG PO TABS
25.00 | ORAL_TABLET | ORAL | Status: DC
Start: 2018-05-18 — End: 2018-05-18

## 2018-05-18 MED ORDER — QUETIAPINE FUMARATE 25 MG PO TABS
25.00 | ORAL_TABLET | ORAL | Status: DC
Start: 2018-05-18 — End: 2018-05-18

## 2018-05-18 MED ORDER — ALBUTEROL SULFATE (2.5 MG/3ML) 0.083% IN NEBU: 3.0000 mL | INHALATION_SOLUTION | RESPIRATORY_TRACT | Status: DC | PRN

## 2018-05-18 MED ORDER — CALCIUM CARBONATE-VITAMIN D 500-200 MG-UNIT PO TABS
1.0000 | ORAL_TABLET | Freq: Every day | ORAL | Status: DC
  Administered 2018-05-19 – 2018-05-27 (×9): 1 via ORAL
  Filled 2018-05-18 (×9): qty 1

## 2018-05-18 MED ORDER — DIPHENHYDRAMINE HCL 12.5 MG/5ML PO ELIX: 12.5000 mg | ORAL_SOLUTION | Freq: Four times a day (QID) | ORAL | Status: DC | PRN

## 2018-05-18 MED ORDER — ZIPRASIDONE MESYLATE 20 MG IM SOLR
5.00 | INTRAMUSCULAR | Status: DC
Start: ? — End: 2018-05-18

## 2018-05-18 MED ORDER — POLYETHYLENE GLYCOL 3350 17 G PO PACK
17.0000 g | PACK | Freq: Every day | ORAL | Status: DC | PRN
  Filled 2018-05-18: qty 1

## 2018-05-18 MED ORDER — MELATONIN 1 MG PO TABS
2.00 | ORAL_TABLET | ORAL | Status: DC
Start: 2018-05-18 — End: 2018-05-18

## 2018-05-18 MED ORDER — INSULIN ASPART 100 UNIT/ML ~~LOC~~ SOLN
0.0000 [IU] | Freq: Three times a day (TID) | SUBCUTANEOUS | Status: DC
  Administered 2018-05-19 – 2018-05-21 (×4): 1 [IU] via SUBCUTANEOUS
  Administered 2018-05-22 – 2018-05-24 (×2): 2 [IU] via SUBCUTANEOUS
  Administered 2018-05-24 – 2018-05-26 (×2): 1 [IU] via SUBCUTANEOUS

## 2018-05-18 MED ORDER — ALUM & MAG HYDROXIDE-SIMETH 200-200-20 MG/5ML PO SUSP: 30.0000 mL | ORAL | Status: DC | PRN

## 2018-05-18 MED ORDER — WARFARIN SODIUM 5 MG PO TABS
5.0000 mg | ORAL_TABLET | Freq: Once | ORAL | Status: AC
  Administered 2018-05-18: 5 mg via ORAL
  Filled 2018-05-18: qty 1

## 2018-05-18 MED ORDER — HYDRALAZINE HCL 20 MG/ML IJ SOLN
10.00 | INTRAMUSCULAR | Status: DC
Start: ? — End: 2018-05-18

## 2018-05-18 MED ORDER — QUETIAPINE FUMARATE 25 MG PO TABS
25.0000 mg | ORAL_TABLET | Freq: Two times a day (BID) | ORAL | Status: DC
  Administered 2018-05-18 – 2018-05-27 (×18): 25 mg via ORAL
  Filled 2018-05-18 (×18): qty 1

## 2018-05-18 MED ORDER — NICOTINE 21 MG/24HR TD PT24
21.0000 mg | MEDICATED_PATCH | Freq: Every day | TRANSDERMAL | Status: DC
  Administered 2018-05-19 – 2018-05-27 (×9): 21 mg via TRANSDERMAL
  Filled 2018-05-18 (×9): qty 1

## 2018-05-18 MED ORDER — PROCHLORPERAZINE 25 MG RE SUPP: 12.5000 mg | Freq: Four times a day (QID) | RECTAL | Status: DC | PRN

## 2018-05-18 MED ORDER — BENZOCAINE-MENTHOL 15-3.6 MG MT LOZG
1.00 | LOZENGE | OROMUCOSAL | Status: DC
Start: ? — End: 2018-05-18

## 2018-05-18 MED ORDER — LABETALOL HCL 5 MG/ML IV SOLN
10.00 | INTRAVENOUS | Status: DC
Start: ? — End: 2018-05-18

## 2018-05-18 MED ORDER — GUAIFENESIN-DM 100-10 MG/5ML PO SYRP: 5.0000 mL | ORAL_SOLUTION | Freq: Four times a day (QID) | ORAL | Status: DC | PRN

## 2018-05-18 MED ORDER — ASPIRIN EC 81 MG PO TBEC
81.0000 mg | DELAYED_RELEASE_TABLET | Freq: Every day | ORAL | Status: DC
  Administered 2018-05-19 – 2018-05-27 (×9): 81 mg via ORAL
  Filled 2018-05-18 (×9): qty 1

## 2018-05-18 MED ORDER — METFORMIN HCL 500 MG PO TABS
500.0000 mg | ORAL_TABLET | Freq: Every day | ORAL | Status: DC
  Administered 2018-05-19 – 2018-05-27 (×9): 500 mg via ORAL
  Filled 2018-05-18 (×9): qty 1

## 2018-05-18 MED ORDER — NON FORMULARY: 2.0000 mg | Freq: Every day | Status: DC

## 2018-05-18 MED ORDER — METFORMIN HCL ER 500 MG PO TB24
500.00 | ORAL_TABLET | ORAL | Status: DC
Start: 2018-05-19 — End: 2018-05-18

## 2018-05-18 MED ORDER — ACETAMINOPHEN 325 MG PO TABS
325.0000 mg | ORAL_TABLET | ORAL | Status: DC | PRN
  Administered 2018-05-25: 325 mg via ORAL
  Filled 2018-05-18: qty 2

## 2018-05-18 MED ORDER — DICLOFENAC SODIUM 1 % TD GEL
2.0000 g | Freq: Three times a day (TID) | TRANSDERMAL | Status: DC
  Administered 2018-05-18 – 2018-05-26 (×8): 2 g via TOPICAL
  Filled 2018-05-18: qty 100

## 2018-05-18 MED ORDER — INSULIN ASPART 100 UNIT/ML ~~LOC~~ SOLN: 0.0000 [IU] | Freq: Every day | SUBCUTANEOUS | Status: DC

## 2018-05-18 MED ORDER — TRAZODONE HCL 50 MG PO TABS
25.0000 mg | ORAL_TABLET | Freq: Every evening | ORAL | Status: DC | PRN
  Administered 2018-05-22: 25 mg via ORAL
  Filled 2018-05-18: qty 1

## 2018-05-18 MED ORDER — SODIUM CHLORIDE 0.9 % IV SOLN
10.00 | INTRAVENOUS | Status: DC
Start: ? — End: 2018-05-18

## 2018-05-18 MED ORDER — FLEET ENEMA 7-19 GM/118ML RE ENEM: 1.0000 | ENEMA | Freq: Once | RECTAL | Status: DC | PRN

## 2018-05-18 MED ORDER — GENERIC EXTERNAL MEDICATION
10.00 | Status: DC
Start: ? — End: 2018-05-18

## 2018-05-18 MED ORDER — ENOXAPARIN SODIUM 60 MG/0.6ML ~~LOC~~ SOLN
60.0000 mg | Freq: Two times a day (BID) | SUBCUTANEOUS | Status: DC
  Administered 2018-05-18 – 2018-05-21 (×6): 60 mg via SUBCUTANEOUS
  Filled 2018-05-18 (×6): qty 0.6

## 2018-05-18 MED ORDER — PROCHLORPERAZINE EDISYLATE 10 MG/2ML IJ SOLN: 5.0000 mg | Freq: Four times a day (QID) | INTRAMUSCULAR | Status: DC | PRN

## 2018-05-18 MED ORDER — NON FORMULARY: 2.0000 g | Freq: Three times a day (TID) | Status: DC

## 2018-05-18 MED ORDER — PROCHLORPERAZINE MALEATE 5 MG PO TABS: 5.0000 mg | ORAL_TABLET | Freq: Four times a day (QID) | ORAL | Status: DC | PRN

## 2018-05-18 MED ORDER — NICOTINE 21 MG/24HR TD PT24
1.00 | MEDICATED_PATCH | TRANSDERMAL | Status: DC
Start: 2018-05-19 — End: 2018-05-18

## 2018-05-18 MED ORDER — MELATONIN 3 MG PO TABS
3.0000 mg | ORAL_TABLET | Freq: Every day | ORAL | Status: DC
  Administered 2018-05-18 – 2018-05-25 (×8): 3 mg via ORAL
  Filled 2018-05-18 (×10): qty 1

## 2018-05-18 NOTE — Progress Notes (Signed)
ANTICOAGULATION CONSULT NOTE - Initial Consult  Pharmacy Consult for Lovenox + Warfarin Indication: stroke & concern for R-mobile thrombus  Allergies  Allergen Reactions  . Codeine Itching  . Other     NUTS ?LOBSTER  . Penicillins Rash  . Sulfa Antibiotics Rash    Patient Measurements:   Ht: 5'4" Wt: 64.8 kg  Vital Signs: Temp: 97.5 F (36.4 C) (01/08 1342) Temp Source: Oral (01/08 1342) BP: 147/95 (01/08 1342) Pulse Rate: 79 (01/08 1342)  Labs: No results for input(s): HGB, HCT, PLT, APTT, LABPROT, INR, HEPARINUNFRC, HEPRLOWMOCWT, CREATININE, CKTOTAL, CKMB, TROPONINI in the last 72 hours.  CrCl cannot be calculated (Patient's most recent lab result is older than the maximum 21 days allowed.).   Medical History: Past Medical History:  Diagnosis Date  . Anginal pain ([place])   . Arthritis   . CAD (coronary artery disease)    a. 09/25/13 s/p DES to [facility] and DES to pRCA.  . DM (diabetes mellitus) ([place])    a. diet controlled.   Marland Kitchen HTN (hypertension)   . Myocardial infarction [facility]) 09/2013   nstemi  . Tobacco abuse     Assessment: 51 YOF transferred to CIR from [place] on 1/8 for rehab s/p recent CVA on 1/1. The patient was also found to have a possible R-mobile thrombus and had been receiving Lovenox bridge with therapeutic INR at [place] consulted to resume dosing this admission.  Per transfer records/MAR:  Lovenox 60 mg x 1 - last dose 1/8 at 0846 Warf 5 mg - last dose 1/7 at 1800  From 1/8 AM labs: INR 1.4, Hgb 14.2, plts 188, SCr 0.9  Goal of Therapy:  INR 2-3 Anti-Xa level 0.6-1 units/ml 4hrs after LMWH dose given Monitor platelets by anticoagulation protocol: Yes   Plan:  - Resume Lovenox 60 mg SQ every 12 hours (next dose due 1/8 evening) - Warfarin 5 mg x 1 dose at 1800 today - F/u CBC/INR in the AM - Will continue to monitor for any signs/symptoms of bleeding and will follow up with PT/INR in the a.m.   Thank you for allowing pharmacy to be  a part of this patient's care.  Alycia Rossetti, PharmD, BCPS Clinical Pharmacist Please check AMION for all [place] numbers 05/18/2018 2:30 PM

## 2018-05-18 NOTE — Progress Notes (Signed)
Patient arrived on unit from [place]. Appears alert with no complaint of pain. Assigned to [id number].

## 2018-05-18 NOTE — Progress Notes (Signed)
[facility] PMR Admission Coordinator Pre-Admission Assessment  Patient: Rachel Perkins is an 68 y.o., female MRN: [id number] DOB: Jan 09, 1951 Height: '5\' 4"'  (162.6 cm) Weight: 64.4 kg  Insurance Information HMO:   PPO:      PCP:      IPA:      80/20: Yes     OTHER:  PRIMARY: Medicare A and B      Policy#: [id number]      Subscriber: Patient CM Name:       Phone#:      Fax#:  Pre-Cert#:       Employer:  Benefits:  Phone #:      Name: verified eligibility via [place] on 05/17/18 Eff. Date: part A effective 05/11/13; part B effective 05/11/13     Deduct: $1,408.00      Out of Pocket Max: NA      Life Max: NA CIR: Covered per Medicare guidelines once yearly deductible is met      SNF: days 1-20, 100%; days 21-100, 80% Outpatient: 80%     Co-Pay: 20% Home Health: 100%      Co-Pay:  DME: 80%     Co-Pay: 20% Providers: Pt's choice SECONDARY: [employer]      Policy#: [id number]      Subscriber: Patient CM Name:       Phone#:      Fax#:  Pre-Cert#:       Employer:  Benefits:  Phone #: [phone]     Name:  Eff. Date:      Deduct:       Out of Pocket Max:       Life Max:  CIR:       SNF:  Outpatient:      Co-Pay:  Home Health:       Co-Pay:  DME:      Co-Pay:   Medicaid Application Date:       Case Manager:  Disability Application Date:       Case Worker:   Emergency Contact Information         Contact Information    Name Relation Home Work Mobile   [place] Spouse [phone]        Current Medical History  Patient Admitting Diagnosis: Right MCA CVA and small L MCA Stroke.  History of Present Illness: Pt is a 68 yo F with history of CAD with cardiac stent 4 years ago and diabetes mellitus II. PTA pt was independent in ADLs and mobility, working full time in a warehouse, and residing in a 1 story home with her husband with 2 steps to enter. Pt presented to [facility] due to weakness and numbness in LUE with CT showing right MCA distribution  infarct in right MCA M2 branch occulsion. Her initial NIH was 2. Pt was not felt to be candidate for TPA or thrombectomy. Pt was transferred to [facility] for management. An MRI of the brain shows large acute infarct in the R MCA distrubtion involving the posterior temporal lobe and insular cortex, small acute infarcts in the left frontal and right parietal lobes, a small remote infarct in the left posterior parietal lobe, and moderate chronic microvascular ischemic change of the white matter. 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 The patient  did have agitated delirium during hospital course but that has since resolved; Pt also exhibited signs of non-epileptic myoclonus with full body jerking noted on 05/12/18 which has since resolved since administration of Keppra. Keppra has been discontinued as of 1/4. Pt has participated in PT and OT sessions with recommendations for CIR. Pt is to be admitted to CIR on 05/18/18.    Patient's medical record from [facility] has been reviewed by the rehabilitation admission coordinator and physician. NIH Stroke scale: '@FLOW' ([id number]) Glascow Coma Scale: )    Past Medical History  Past Medical History:  Diagnosis Date  . Anginal pain ([place])   . Arthritis   . CAD (coronary artery disease)    a. 09/25/13 s/p DES to [facility] and DES to pRCA.  . DM (diabetes mellitus) ([place])    a. diet controlled.   Marland Kitchen HTN (hypertension)   . Myocardial infarction [facility]) 09/2013   nstemi  . Tobacco abuse     Family History   family history includes Heart failure (age of onset: 40) in her father.  Prior Rehab/Hospitalizations Has the patient had major surgery during 100 days prior to admission? No               Current Medications  Current Outpatient  Medications:  .  aspirin EC 81 MG tablet, Take 81 mg by mouth daily., Disp: , Rfl:  .  atorvastatin (LIPITOR) 80 MG tablet, Take 1 tablet (80 mg total) by mouth daily at 6 PM., Disp: 30 tablet, Rfl: 11 .  meclizine (ANTIVERT) 25 MG tablet, Take 1 tablet by mouth 3 (three) times daily as needed., Disp: , Rfl:  .  metFORMIN (GLUCOPHAGE-XR) 500 MG 24 hr tablet, Take 0.5 tablets by mouth daily., Disp: , Rfl:  .  metoprolol tartrate (LOPRESSOR) 25 MG tablet, Take 1 tablet (25 mg total) by mouth 2 (two) times daily., Disp: 60 tablet, Rfl: 11 .  nitroGLYCERIN (NITROSTAT) 0.4 MG SL tablet, PLACE 1 TABLET UNDER THE TONGUE EVERY 5 MINUTES FOR 3 DOSES AS NEEDED CHEST PAIN, Disp: 25 tablet, Rfl: 0 .  sertraline (ZOLOFT) 50 MG tablet, , Disp: , Rfl:   Patients Current Diet:      Diet Order    None      Precautions / Restrictions Precautions Precautions: Fall Precautions/Special Needs: Other Precaution Comments: BUE IV (from acute); BP > 220 (from acute)   Has the patient had 2 or more falls or a fall with injury in the past year?No  Prior Activity Level Community (5-7x/wk): worked full time, Health and safety inspector; drove PTA. very active  Prior Functional Level Do you want Prior Function Level of Independence: Independent from other? Self Care: Did the patient need help bathing, dressing, using the toilet or eating?  Independent  Indoor Mobility: Did the patient need assistance with walking from room to room (with or without device)? Independent  Stairs: Did the patient need assistance with internal or external stairs (with or without device)? Independent  Functional Cognition: Did the patient need help planning regular tasks such as shopping or remembering to take medications? Independent  Home Assistive Devices / Equipment Home Assistive Devices/Equipment: None  Prior Device Use: Indicate devices/aids used by the patient prior to current illness, exacerbation or injury? None  of the above  Prior Functional Level   Prior Functional Level Current Functional Level  Bed Mobility Independent Supervision  Transfers Independent  Min A  Mobility - Walk/Wheelchair Independent Min A  Mobility - Ambulation/Gait Independent Min  A  Upper Body Dressing Independent Min A  Lower Body Dressing Independent Max A  Grooming Independent Min A  Eating/Drinking Independent Min A  Economist Mod A  Bladder Continence Continent, Independent Continence, Max A  Bowel Management  Independent Max A  Stair Climbing Independent NT  Communication WNL Not formally assessed; WFL during phone conversation  Memory WNL Cognition assessed per OT, with noted decreased attention, poor processing, single step commands with need for redirection  Cooking/Meal Prep  Independent      Housework  Independent   Money Management  Independent   Driving  Independent     Special needs/care consideration BiPAP/CPAP: no CPM: no Continuous Drip IV: no Dialysis: no        Days: no Life Vest: no Oxygen: no Special Bed: no Trach Size: no Wound Vac (area): no      Location: no Skin: no areas of concern                            Bowel mgmt:continent, last BM: 05/15/18 Bladder mgmt: continent Diabetic mgmt: yes  Previous Home Environment Living Arrangements: Spouse/significant other  Lives With: Spouse Available Help at Discharge: Family Type of Home: House Home Layout: One level Home Access: Stairs to enter Entrance Stairs-Rails: Right, Left Entrance Stairs-Number of Steps: 2 Bathroom Shower/Tub: Optometrist: Yes How Accessible: Accessible via walker [place]: No Additional Comments: no Home services currently   Discharge Living Setting Plans for Discharge Living Setting: Patient's home, Lives with (comment) Type of Home at Discharge: House Discharge Home Layout: One level Discharge  Home Access: Stairs to enter Entrance Stairs-Rails: Right, Left Entrance Stairs-Number of Steps: 2 Discharge Bathroom Shower/Tub: Tub/shower unit Discharge Bathroom Toilet: Standard Discharge Bathroom Accessibility: Yes How Accessible: Accessible via walker Does the patient have any problems obtaining your medications?: No  Social/Family/Support Systems Patient Roles: Spouse, Other (Comment)(full time employee) Contact Information: husband (Rober): [phone] Anticipated Caregiver: husband and sons lives close by Anticipated Caregiver's Contact Information: see above Ability/Limitations of Caregiver: Min A Caregiver Availability: 24/7(close to 24/7, husband works 3.5hr/daily but can Producer, television/film/video) Discharge Plan Discussed with Primary Caregiver: Yes Is Caregiver In Agreement with Plan?: Yes Does Caregiver/Family have Issues with Lodging/Transportation while Pt is in Rehab?: No  Goals/Additional Needs Patient/Family Goal for Rehab: PT: supervision; OT: Supervision/Min A; SLP: NA Expected length of stay: 7-10 days Cultural Considerations: [employer] Dietary Needs: consistent carbs; consistent vitamin k diet; thin liquids Equipment Needs: TBD Pt/Family Agrees to Admission and willing to participate: Yes Program Orientation Provided & Reviewed with Pt/Caregiver Including Roles  & Responsibilities: Yes(pt and husband)  Barriers to Discharge: Home environment access/layout  Barriers to Discharge Comments: steps to enter  Patient Condition: I have reviewed medical records from [facility], spoken with CM, RN, and the patient and her spouse. I discussed via phone all information regarding CIR program and gathered pertinent information required for inpatient rehabilitation assessment.  Patient will benefit from ongoing PT and OT, can actively participate in 3 hours of therapy a day 5 days of the week, and can make measurable gains during the admission.  Patient will also  benefit from the coordinated team approach during an Inpatient Acute Rehabilitation admission.  The patient will receive intensive therapy as well as Rehabilitation physician, nursing, social worker, and care management interventions.  Due to bowel management, bladder management, safety, disease management, medical administration, pain  management, patient education the patient requires 24 hour a day rehabilitation nursing.  The patient is currently Min A with mobility and Min-Max A basic ADLs.  Discharge setting and therapy post discharge at home with home health is anticipated.  Patient has agreed to participate in the Acute Inpatient Rehabilitation Program and will admit today, 05/18/18.  Preadmission Screen Completed By:  Jhonnie Garner, 05/18/2018 8:36 AM ______________________________________________________________________   Discussed status with Dr. Posey Pronto on 05/17/18 at 12:56PM and received telephone approval for admission today.  Admission Coordinator:  Jhonnie Garner, time 8:34AM/Date 05/18/18   Assessment/Plan: Diagnosis: Right MCA CVA and small L MCA   1. Does the need for close, 24 hr/day  Medical supervision in concert with the patient's rehab needs make it unreasonable for this patient to be served in a less intensive setting? Yes  2. Co-Morbidities requiring supervision/potential complications: CAD with cardiac stent 4 years ago and diabetes mellitus II 3. Due to safety, disease management and patient education, does the patient require 24 hr/day rehab nursing? Yes 4. Does the patient require coordinated care of a physician, rehab nurse, PT (1-2 hrs/day, 5 days/week), OT (1-2 hrs/day, 5 days/week) and SLP (1-2 hrs/day, 5 days/week) to address physical and functional deficits in the context of the above medical diagnosis(es)? Yes Addressing deficits in the following areas: balance, endurance, locomotion, strength, transferring, bathing, dressing, toileting, cognition and psychosocial  support 5. Can the patient actively participate in an intensive therapy program of at least 3 hrs of therapy 5 days a week? Yes 6. The potential for patient to make measurable gains while on inpatient rehab is excellent 7. Anticipated functional outcomes upon discharge from inpatients are: supervision PT, supervision and min assist OT, modified independent and supervision SLP 8. Estimated rehab length of stay to reach the above functional goals is: 7-10 days. 9. Anticipated D/C setting: Home 10. Anticipated post D/C treatments: HH therapy and Home excercise program 11. Overall Rehab/Functional Prognosis: good    RECOMMENDATIONS: This patient's condition is appropriate for continued rehabilitative care in the following setting: CIR Patient has agreed to participate in recommended program. Yes Note that insurance prior authorization may be required for reimbursement for recommended care.  Jhonnie Garner 05/18/2018  Delice Lesch, MD, ABPMR        Revision History    Date/Time User Provider Type Action  05/18/2018 8:57 AM Jamse Arn, MD Physician Sign  05/18/2018 8:37 AM Jhonnie Garner, OT Rehab Admission Coordinator Share  View Details Report

## 2018-05-18 NOTE — H&P (Signed)
Physical Medicine and Rehabilitation Admission H&P    CC: Functional deficits due to storke.    HPI: Rachel Perkins is a 68 year old female with history of CAD s/p stent, T2DM, tobacco abuse, chronic pain;  who was admitted to  [facility] 05/11/2018 via [facility] with LUE weakness and numbness due to right MCA distribution infarct and right MCA M2 branch occlusion.  History taken from chart review and patient.  She was not felt to be a candidate for thrombectomy due to low NIHSS.  MRI brain done revealing large acute infarct right MCA distribution and small acute infarcts in left frontal and right parietal lobes with remote infarct left posterior parietal lobe and moderate chronic white matter disease.  Neurology consult for input and felt patient with cardioembolic stroke and recommended 30-day heart monitor at discharge to rule out A. Fib. 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 Blood cultures negative and she was started IV heparin and  CT head repeated on 1/5 for monitoring and was negative for bleed.  She was transitioned to lovenox/coumadin bridge yesterday. Dr. Adonis Housekeeper with CT surgery consulted for input and recommends resection of mass 2 weeks past discharge from rehab.  EEG done on 1/2 due to myoclonus type jerking of bilateral upper extremity.  LTM EEG done showing continuous right hemisphere slowing and myoclonic jerks without electrographic correlation.  She was treated with Keppra briefly and oxycodone/gabapentin discontinued with resolution of symptoms.  Tylenol added for pain management she has had issues with agitation due to delirium as well as sundowning with sleep deprivation.  She was started on Seroquel as well as melatonin to help with sleep-wake disruption. Blood sugars were poorly controlled requiring Glucomander and now transitioned to metformin. Patient with  resultant  left sensory deficits with decreased coordination LUE and mild weakness LLE. CIR recommended due to functional decline.  Please also see preadmission assessment.   Review of Systems  Constitutional: Negative for chills and fever.  HENT: Negative for hearing loss and tinnitus.   Eyes: Negative for blurred vision and double vision.  Respiratory: Negative for cough and shortness of breath.   Cardiovascular: Negative for chest pain and palpitations.  Gastrointestinal: Negative for constipation, heartburn and nausea.  Genitourinary: Negative for dysuria and urgency.  Musculoskeletal: Positive for back pain and myalgias.  Skin: Negative for rash.  Neurological: Positive for sensory change and focal weakness.  Psychiatric/Behavioral: The patient has insomnia.   All other systems reviewed and are negative.     Past Medical History:  Diagnosis Date  . Anginal pain ([place])   . Arthritis   . CAD (coronary artery disease)    a. 09/25/13 s/p DES to [facility] and DES to pRCA.  . DM (diabetes mellitus) ([place])    a. diet controlled.   Marland Kitchen HTN (hypertension)   . Myocardial infarction [facility]) 09/2013   nstemi  . Tobacco abuse     Past Surgical History:  Procedure Laterality Date  . ABDOMINAL HYSTERECTOMY    . ANKLE SURGERY    . APPENDECTOMY    . CORONARY ANGIOPLASTY  09/25/2013   des mid circumflex  des rca  . LEFT HEART CATHETERIZATION WITH CORONARY ANGIOGRAM N/A 09/25/2013   Procedure: LEFT HEART CATHETERIZATION WITH CORONARY ANGIOGRAM;  Surgeon: Burnell Blanks, MD;  Location: [facility] CATH LAB;  Service: Cardiovascular;  Laterality: N/A;  . skin grafts left arm  Family History  Problem Relation Age of Onset  . Heart failure Father 21    Social History:  reports that she has been smoking cigarettes. She started smoking about 53 years ago. She has a 25.00 pack-year smoking history. She has never used smokeless tobacco. She reports that she does not drink alcohol or use drugs.     Allergies  Allergen Reactions  . Codeine Itching  . Penicillins Rash    DID THE REACTION INVOLVE: Swelling of the face/tongue/throat, SOB, or low BP? Unknown Sudden or severe rash/hives, skin peeling, or the inside of the mouth or nose? Unknown Did it require medical treatment? Unknown When did it last happen?unk If all above answers are "NO", may proceed with cephalosporin use.   . Sulfa Antibiotics Rash  . Other Other (See Comments)    NUTS ?LOBSTER unknown    Medications Prior to Admission  Medication Sig Dispense Refill  . aspirin EC 81 MG tablet Take 81 mg by mouth daily.    Marland Kitchen atorvastatin (LIPITOR) 80 MG tablet Take 1 tablet (80 mg total) by mouth daily at 6 PM. (Patient taking differently: Take 40 mg by mouth daily at 6 PM. ) 30 tablet 11  . augmented betamethasone dipropionate (DIPROLENE-AF) 0.05 % cream Apply 1 application topically 2 (two) times daily as needed (skin irritation).     . calcium acetate (PHOSLO) 667 MG capsule Take 667 mg by mouth daily.    Marland Kitchen docusate sodium (COLACE) 100 MG capsule Take 100 mg by mouth 2 (two) times daily.    Marland Kitchen enoxaparin (LOVENOX) 60 MG/0.6ML injection Inject 60 mg into the skin every 12 (twelve) hours. For 14 days Started on 05-17-17    . metFORMIN (GLUCOPHAGE-XR) 500 MG 24 hr tablet Take 500 mg by mouth daily.     . metoprolol tartrate (LOPRESSOR) 25 MG tablet Take 1 tablet (25 mg total) by mouth 2 (two) times daily. 60 tablet 11  . nicotine (NICODERM CQ - DOSED IN MG/24 HOURS) 21 mg/24hr patch Place 21 mg onto the skin daily.    . QUEtiapine (SEROQUEL) 25 MG tablet Take 25 mg by mouth 2 (two) times daily.    Marland Kitchen warfarin (COUMADIN) 5 MG tablet Take 5 mg by mouth daily at 6 PM.    . ziprasidone (GEODON) injection Inject 5 mg into the muscle every 4 (four) hours as needed for agitation.    . Melatonin 1 MG TABS Take 2 mg by mouth at bedtime.    . nitroGLYCERIN (NITROSTAT) 0.4 MG SL tablet PLACE 1 TABLET UNDER THE TONGUE EVERY 5  MINUTES FOR 3 DOSES AS NEEDED CHEST PAIN (Patient not taking: Reported on 05/18/2018) 25 tablet 0    Drug Regimen Review  Drug regimen was reviewed and remains appropriate with no significant issues identified  Home: One level home with   Functional History: Independent and was working part time PTA.   Functional Status:  Mobility:      ADL: Needs assistance to use UE for meals.   Cognition:       Blood pressure (!) 147/95, pulse 79, temperature (!) 97.5 F (36.4 C), temperature source Oral, resp. rate 18, SpO2 97 %. Physical Exam  Nursing note and vitals reviewed. Constitutional: She is oriented to person, place, and time. She appears well-developed and well-nourished.  HENT:  Head: Normocephalic and atraumatic.  Eyes: EOM are normal. Right eye exhibits no discharge. Left eye exhibits no discharge.  Neck: Normal range of motion. Neck supple.  Cardiovascular: Normal  rate and regular rhythm.  Respiratory: Effort normal and breath sounds normal.  GI: Soft. Bowel sounds are normal.  Musculoskeletal:     Comments: No edema or tenderness in extremities  Neurological: She is alert and oriented to person, place, and time.  Motor: Left upper extremity: 4+/5 proximal distal Left lower extremity: 4-4+/5 proximal distal Right upper extremity/right lower extremity: 5/5 proximal distal  Skin:  BUE with multiple ecchymotic areas.   Psychiatric: She has a normal mood and affect. Her behavior is normal.    No results found for this or any previous visit (from the past 48 hour(s)). No results found.     Medical Problem List and Plan: 1.  Limitations with mobility, safety, self-care, cognitive deficits secondary to right MCA infarct. 2.  Fibroelastoma aortic valve/DVT Prophylaxis/Anticoagulation: Pharmaceutical: Coumadin and Lovenox bridge till INR > 2.0 X 2 days.  3. Chronic back pain/Pain Management: Off gabapentin and oxycodone due to myoclonus.  4. Mood: LCSW to follow  for evaluation and support.  5. Neuropsych: This patient is capable of making decisions on her own behalf. 6. Skin/Wound Care: Routine pressure relief measures.  7. Fluids/Electrolytes/Nutrition: Monitor I/O. Check lytes in am.  8. HTN: Monitor BP bid--continue metoprolol bid. 9. T2DM: Hgb A1c-7.0. Continue metformin daily. Monitor BS ac/hs.  10.  Sleep disturbance: Continue melatonin at nights. Check sleep wake chart 11. Delirium: Continue Seroquel bid for now.     Post Admission Physician Evaluation: 1. Preadmission assessment reviewed and changes made below. 2. Functional deficits secondary  to right MCA infarct. 3. Patient is admitted to receive collaborative, interdisciplinary care between the physiatrist, rehab nursing staff, and therapy team. 4. Patient's level of medical complexity and substantial therapy needs in context of that medical necessity cannot be provided at a lesser intensity of care such as a SNF. 5. Patient has experienced substantial functional loss from his/her baseline which was documented above under the "Functional History" and "Functional Status" headings.  Judging by the patient's diagnosis, physical exam, and functional history, the patient has potential for functional progress which will result in measurable gains while on inpatient rehab.  These gains will be of substantial and practical use upon discharge  in facilitating mobility and self-care at the household level. 64. Physiatrist will provide 24 hour management of medical needs as well as oversight of the therapy plan/treatment and provide guidance as appropriate regarding the interaction of the two. 7. 24 hour rehab nursing will assist with bladder management, safety, disease management and patient education  and help integrate therapy concepts, techniques,education, etc. 8. PT will assess and treat for/with: Lower extremity strength, range of motion, stamina, balance, functional mobility, safety, adaptive  techniques and equipment, coping skills, pain control, education. Goals are: Supervision. 9. OT will assess and treat for/with: ADL's, functional mobility, safety, upper extremity strength, adaptive techniques and equipment, ego support, and community reintegration.   Goals are: Supervision/Min A. Therapy may proceed with showering this patient. 10. SLP will assess and treat for/with: Cognition.  Goals are: Mod I. 11. Case Management and Social Worker will assess and treat for psychological issues and discharge planning. 12. Team conference will be held weekly to assess progress toward goals and to determine barriers to discharge. 13. Patient will receive at least 3 hours of therapy per day at least 5 days per week. 14. ELOS: 7-10 days.       15. Prognosis:  good  I have personally performed a face to face diagnostic evaluation, including, but not limited to  relevant history and physical exam findings, of this patient and developed relevant assessment and plan.  Additionally, I have reviewed and concur with the physician assistant's documentation above.   Delice Lesch, MD, ABPMR Bary Leriche, PA-C 05/18/2018

## 2018-05-19 ENCOUNTER — Inpatient Hospital Stay (HOSPITAL_COMMUNITY): Payer: Medicare Other | Admitting: Occupational Therapy

## 2018-05-19 ENCOUNTER — Inpatient Hospital Stay (HOSPITAL_COMMUNITY): Payer: Medicare Other | Admitting: Speech Pathology

## 2018-05-19 ENCOUNTER — Inpatient Hospital Stay (HOSPITAL_COMMUNITY): Payer: Medicare Other | Admitting: Physical Therapy

## 2018-05-19 DIAGNOSIS — R414 Neurologic neglect syndrome: Secondary | ICD-10-CM

## 2018-05-19 LAB — COMPREHENSIVE METABOLIC PANEL WITH GFR
ALT: 67 U/L — ABNORMAL HIGH (ref 0–44)
AST: 36 U/L (ref 15–41)
Albumin: 3.4 g/dL — ABNORMAL LOW (ref 3.5–5.0)
Alkaline Phosphatase: 54 U/L (ref 38–126)
Anion gap: 9 (ref 5–15)
BUN: 15 mg/dL (ref 8–23)
CO2: 23 mmol/L (ref 22–32)
Calcium: 9.4 mg/dL (ref 8.9–10.3)
Chloride: 105 mmol/L (ref 98–111)
Creatinine, Ser: 0.9 mg/dL (ref 0.44–1.00)
GFR calc Af Amer: >60 mL/min
GFR calc non Af Amer: >60 mL/min
Glucose, Bld: 111 mg/dL — ABNORMAL HIGH (ref 70–99)
Potassium: 4.2 mmol/L (ref 3.5–5.1)
Sodium: 137 mmol/L (ref 135–145)
Total Bilirubin: 0.7 mg/dL (ref 0.3–1.2)
Total Protein: 6.2 g/dL — ABNORMAL LOW (ref 6.5–8.1)

## 2018-05-19 LAB — CBC WITH DIFFERENTIAL/PLATELET
Abs Immature Granulocytes: 0.02 10*3/uL (ref 0.00–0.07)
Basophils Absolute: 0 10*3/uL (ref 0.0–0.1)
Basophils Relative: 1 %
Eosinophils Absolute: 0.1 10*3/uL (ref 0.0–0.5)
Eosinophils Relative: 2 %
HCT: 43.9 % (ref 36.0–46.0)
Hemoglobin: 14.8 g/dL (ref 12.0–15.0)
Immature Granulocytes: 0 %
Lymphocytes Relative: 29 %
Lymphs Abs: 2.5 10*3/uL (ref 0.7–4.0)
MCH: 32.1 pg (ref 26.0–34.0)
MCHC: 33.7 g/dL (ref 30.0–36.0)
MCV: 95.2 fL (ref 80.0–100.0)
Monocytes Absolute: 0.7 10*3/uL (ref 0.1–1.0)
Monocytes Relative: 8 %
Neutro Abs: 5.2 10*3/uL (ref 1.7–7.7)
Neutrophils Relative %: 60 %
Platelets: 210 10*3/uL (ref 150–400)
RBC: 4.61 MIL/uL (ref 3.87–5.11)
RDW: 13.9 % (ref 11.5–15.5)
WBC: 8.6 10*3/uL (ref 4.0–10.5)
nRBC: 0 % (ref 0.0–0.2)

## 2018-05-19 LAB — PROTIME-INR
INR: 1.64
Prothrombin Time: 19.2 s — ABNORMAL HIGH (ref 11.4–15.2)

## 2018-05-19 LAB — GLUCOSE, CAPILLARY
Glucose-Capillary: 107 mg/dL — ABNORMAL HIGH (ref 70–99)
Glucose-Capillary: 136 mg/dL — ABNORMAL HIGH (ref 70–99)
Glucose-Capillary: 117 mg/dL — ABNORMAL HIGH (ref 70–99)
Glucose-Capillary: 136 mg/dL — ABNORMAL HIGH (ref 70–99)

## 2018-05-19 LAB — MAGNESIUM: Magnesium: 1.6 mg/dL — ABNORMAL LOW (ref 1.7–2.4)

## 2018-05-19 MED ORDER — WARFARIN SODIUM 5 MG PO TABS
5.0000 mg | ORAL_TABLET | Freq: Once | ORAL | Status: AC
  Administered 2018-05-19: 5 mg via ORAL
  Filled 2018-05-19: qty 1

## 2018-05-19 MED ORDER — MUSCLE RUB 10-15 % EX CREA
TOPICAL_CREAM | Freq: Three times a day (TID) | CUTANEOUS | Status: DC | PRN
  Filled 2018-05-19: qty 85

## 2018-05-19 MED ORDER — ACETAMINOPHEN 325 MG PO TABS: 325.0000 mg | ORAL_TABLET | ORAL | Status: AC | PRN

## 2018-05-19 MED ORDER — CALCIUM CARBONATE-VITAMIN D 500-200 MG-UNIT PO TABS: 1.0000 | ORAL_TABLET | Freq: Every day | ORAL | Status: AC

## 2018-05-19 NOTE — Evaluation (Signed)
Occupational Therapy Assessment and Plan  Patient Details  Name: Rachel Perkins MRN: [id number] Date of Birth: 09-28-1950  OT Diagnosis: apraxia, cognitive deficits, hemiplegia affecting non-dominant side and muscle weakness (generalized) Rehab Potential: Rehab Potential (ACUTE ONLY): Good ELOS: 7-9 days   Today's Date: 05/19/2018 OT Individual Time: 1350-1500 OT Individual Time Calculation (min): 70 min     Problem List:  Patient Active Problem List   Diagnosis Date Noted  . Acute ischemic right MCA stroke ([name]) 05/18/2018  . Chronic pain syndrome   . Diabetes mellitus type 2 in nonobese ([name])   . Sleep disturbance   . Delirium, drug-induced ([name])   . NSTEMI (non-ST elevated myocardial infarction) ([place]) 09/25/2013  . Coronary atherosclerosis of native coronary artery 09/25/2013  . HTN (hypertension) 09/25/2013  . Diabetes mellitus ([name]) 09/25/2013  . Tobacco abuse 09/25/2013    Past Medical History:  Past Medical History:  Diagnosis Date  . Anginal pain ([name])   . Arthritis   . CAD (coronary artery disease)    a. 09/25/13 s/p DES to [facility] and DES to pRCA.  Marland Kitchen Chronic left-sided back pain    has had ESI/Dr. Francesco Runner  . DM (diabetes mellitus) ([name])    a. diet controlled.   Marland Kitchen HTN (hypertension)   . Myocardial infarction [name]) 09/2013   nstemi  . Tobacco abuse    Past Surgical History:  Past Surgical History:  Procedure Laterality Date  . ABDOMINAL HYSTERECTOMY    . ANKLE SURGERY    . APPENDECTOMY    . CORONARY ANGIOPLASTY  09/25/2013   des mid circumflex  des rca  . LEFT HEART CATHETERIZATION WITH CORONARY ANGIOGRAM N/A 09/25/2013   Procedure: LEFT HEART CATHETERIZATION WITH CORONARY ANGIOGRAM;  Surgeon: Burnell Blanks, MD;  Location[facility] CATH LAB;  Service: Cardiovascular;  Laterality: N/A;  . skin grafts left arm      Assessment & Plan Clinical Impression: Patient is a 68 y.o. year old female with history of CAD with cardiac stent 4 years ago and diabetes  mellitus II. PTA pt was independent in ADLs and mobility, working full time in a warehouse, and residing in a 1 story home with her husband with 2 steps to enter. Pt presented to [name] due to weakness and numbness in LUE with CT showing right MCA distribution infarct in right MCA M2 branch occulsion. Her initial NIH was 2. Pt was not felt to be candidate for TPA or thrombectomy. Pt was transferred to [facility] for management. An MRI of the brain shows large acute infarct in the R MCA distrubtion involving the posterior temporal lobe and insular cortex, small acute infarcts in the left frontal and right parietal lobes, a small remote infarct in the left posterior parietal lobe, and moderate chronic microvascular ischemic change of the white matter. 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 The patient did have agitated delirium during hospital course but that has since resolved; Pt also exhibited signs of non-epileptic myoclonus with full body jerking noted on 05/12/18 which has since resolved since administration of Keppra. Keppra has been discontinued as of 1/4. Pt has participated in PT and OT sessions with recommendations for CIR.   Patient transferred to CIR on 05/18/2018 .    Patient currently requires min with basic self-care skills secondary to muscle  weakness, motor apraxia and decreased coordination, decreased attention to left, decreased awareness, decreased problem solving, decreased safety awareness and decreased memory and decreased standing balance, hemiplegia and decreased balance strategies.  Prior to hospitalization, patient could complete ADLs with independent .  Patient will benefit from skilled intervention to increase independence with basic self-care skills prior to discharge home with care partner.   Anticipate patient will require 24 hour supervision and follow up outpatient.  OT - End of Session Activity Tolerance: Tolerates 30+ min activity without fatigue Endurance Deficit: No OT Assessment Rehab Potential (ACUTE ONLY): Good OT Patient demonstrates impairments in the following area(s): Balance;Cognition;Motor;Perception;Safety;Skin Integrity;Sensory OT Basic ADL's Functional Problem(s): Bathing;Grooming;Dressing;Toileting OT Advanced ADL's Functional Problem(s): Simple Meal Preparation;Light Housekeeping OT Transfers Functional Problem(s): Toilet;Tub/Shower OT Additional Impairment(s): Fuctional Use of Upper Extremity OT Plan OT Intensity: Minimum of 1-2 x/day, 45 to 90 minutes OT Frequency: 5 out of 7 days OT Duration/Estimated Length of Stay: 7-9 days OT Treatment/Interventions: Balance/vestibular training;Cognitive remediation/compensation;Community reintegration;Discharge planning;DME/adaptive equipment instruction;Functional mobility training;Neuromuscular re-education;Patient/family education;Psychosocial support;Self Care/advanced ADL retraining;Skin care/wound managment;Therapeutic Activities;Therapeutic Exercise;UE/LE Strength taining/ROM;UE/LE Coordination activities OT Self Feeding Anticipated Outcome(s): Mod I OT Basic Self-Care Anticipated Outcome(s): Mod I OT Toileting Anticipated Outcome(s): Mod I OT Bathroom Transfers Anticipated Outcome(s): Mod I - Supervision shower OT Recommendation Recommendations for Other Services: Therapeutic Recreation consult Therapeutic Recreation Interventions: Outing/community reintergration;Kitchen group Patient destination: Home Follow Up Recommendations: Outpatient OT Equipment Recommended: Tub/shower seat;Tub/shower bench;To be determined   Skilled Therapeutic Intervention OT eval completed with discussion of rehab process, OT purpose, POC, ELOS, and goals.  ADL assessment completed with bathing and dressing at shower level.  Pt  received supine in bed asleep, but easily aroused.  Pt agreeable to bathing at shower level.  Pt ambulated to room shower with min assist without AD.  Engaged in toilet transfer and toileting with min assist.  Pt required increased time and cues to visually attend to LUE when attempting to unfasten bra.  Pt completed bathing at overall min assist for balance at sit > stand level.  Therapist providing cues for attention to LUE during bathing as well as awareness of dropping wash cloth multiple times during bathing (due to decreased attention and awareness as well as decreased sensation in LUE and LLE).  Pt completed dressing tasks with mod cues for orientation, sequencing, and awareness as pt donning underwear backwards and both legs through same leg hole as well as donning shirt backwards.  Educated on visually attending to LUE during functional tasks to compensate for decreased sensation and proprioception.  Pt passed off to arriving PT.  OT Evaluation Precautions/Restrictions  Precautions Precautions: Fall Precaution Comments: mild L inattention, mild apraxia, impulsivity  Restrictions Weight Bearing Restrictions: No  Pain Pain Assessment Pain Scale: 0-10 Pain Score: 0-No pain Home Living/Prior Functioning Home Living Living Arrangements: Spouse/significant other Available Help at Discharge: Family Type of Home: House Home Access: Stairs to enter Technical brewer of Steps: 2 Entrance Stairs-Rails: Right Home Layout: One level Bathroom Shower/Tub: Tub/shower unit(has hand held showerhead) Biochemist, clinical: Standard Bathroom Accessibility: Yes Additional Comments: no Home services currently   Lives With: Spouse IADL History Homemaking Responsibilities: Yes Meal Prep Responsibility: Primary Laundry Responsibility: Primary Cleaning Responsibility: Primary Bill Paying/Finance Responsibility: Secondary Shopping Responsibility: Primary Current License: Yes Occupation: Part time  employment Prior Function Level of Independence: Independent with transfers, Independent with gait, Independent with basic ADLs, Independent with homemaking with ambulation  Able to Take Stairs?: Yes Driving: Yes Vocation: Part time employment Vocation Requirements: works in  warehouse, lifting up to 30-40#, works 5 hrs/day Leisure: Hobbies-yes (Comment) Comments: watching TV/movies  ADL ADL Grooming: Minimal assistance Where Assessed-Grooming: Standing at sink Upper Body Bathing: Minimal cueing, Minimal assistance Where Assessed-Upper Body Bathing: Shower Lower Body Bathing: Minimal assistance, Minimal cueing Where Assessed-Lower Body Bathing: Shower Upper Body Dressing: Minimal assistance, Minimal cueing Where Assessed-Upper Body Dressing: Edge of bed Lower Body Dressing: Minimal assistance, Minimal cueing Where Assessed-Lower Body Dressing: Edge of bed Toileting: Minimal assistance Where Assessed-Toileting: Toilet Toilet Transfer: Minimal assistance Toilet Transfer Method: Ambulating Toilet Transfer Equipment: Grab bars Walk-In Shower Transfer: Minimal cueing, Minimal assistance Walk-In Shower Transfer Method: Ambulating Walk-In Shower Equipment: Grab bars, Shower seat with back Vision Baseline Vision/History: No visual deficits Patient Visual Report: No change from baseline Vision Assessment?: No apparent visual deficits Perception  Perception: Within Functional Limits Praxis Praxis: Intact Praxis Impairment Details: Initiation Praxis-Other Comments: appears to have some mild apraxia but able to work through it with extended time and occasional cues from PT  Cognition Overall Cognitive Status: Impaired/Different from baseline Arousal/Alertness: Awake/alert Orientation Level: Person;Place;Situation Person: Oriented Place: Oriented Situation: Oriented Year: 2020 Month: January Day of Week: Correct Memory: Impaired Memory Impairment: Decreased short term  memory Decreased Short Term Memory: Functional complex Immediate Memory Recall: Sock;Bed;Blue Memory Recall: Blue Memory Recall Blue: Without Cue Attention: Selective Selective Attention: Impaired Selective Attention Impairment: Functional complex Awareness: Impaired Awareness Impairment: Emergent impairment Problem Solving: Impaired Problem Solving Impairment: Functional complex Behaviors: Impulsive Safety/Judgment: Impaired Sensation Sensation Light Touch: Impaired Detail Light Touch Impaired Details: Absent LUE Hot/Cold: Impaired by gross assessment Proprioception: Impaired by gross assessment Stereognosis: Impaired Detail Stereognosis Impaired Details: Absent LUE Coordination Gross Motor Movements are Fluid and Coordinated: No Fine Motor Movements are Fluid and Coordinated: No Coordination and Movement Description: limited by weakness L UE  Finger Nose Finger Test: WNL on Rt, discoordination in LUE Heel Shin Test: WNL  9 Hole Peg Test: Rt: 36 seconds, Lt: 1:16 Motor  Motor Motor: Hemiplegia Motor - Skilled Clinical Observations: increased weakness L UE>L LE  Mobility  Bed Mobility Bed Mobility: Rolling Right;Rolling Left;Supine to Sit;Sit to Supine Rolling Right: Supervision/verbal cueing Rolling Left: Supervision/Verbal cueing Supine to Sit: Supervision/Verbal cueing Sit to Supine: Supervision/Verbal cueing Transfers Sit to Stand: Contact Guard/Touching assist Stand to Sit: Contact Guard/Touching assist  Trunk/Postural Assessment  Cervical Assessment Cervical Assessment: Within Functional Limits Thoracic Assessment Thoracic Assessment: Within Functional Limits Lumbar Assessment Lumbar Assessment: Within Functional Limits Postural Control Postural Control: Deficits on evaluation  Balance Balance Balance Assessed: Yes Standardized Balance Assessment Standardized Balance Assessment: Berg Balance Test Extremity/Trunk Assessment RUE Assessment RUE  Assessment: Within Functional Limits LUE Assessment LUE Assessment: Exceptions to WFL Passive Range of Motion (PROM) Comments: WFL Active Range of Motion (AROM) Comments: WFL General Strength Comments: generalized weakness grossly 4/5 (pt does have history of polio as a child that affected Lt side).   LUE Body System: Neuro Brunstrum levels for arm and hand: Arm;Hand Brunstrum level for arm: Stage IV Movement is deviating from synergy Brunstrum level for hand: Stage V Independence from basic synergies     Refer to Care Plan for Long Term Goals  Recommendations for other services: Therapeutic Recreation  Kitchen group and Outing/community reintegration   Discharge Criteria: Patient will be discharged from OT if patient refuses treatment 3 consecutive times without medical reason, if treatment goals not met, if there is a change in medical status, if patient makes no progress towards goals or if patient is discharged from hospital.  The   above assessment, treatment plan, treatment alternatives and goals were discussed and mutually agreed upon: by patient and by family  Ellwood Dense [facility] 05/19/2018, 3:24 PM

## 2018-05-19 NOTE — Evaluation (Signed)
Speech Language Pathology Assessment and Plan  Patient Details  Name: Rachel Perkins MRN: [id number] Date of Birth: 10/04/1950  SLP Diagnosis: Cognitive Impairments  Rehab Potential: Excellent ELOS: 5-7 days     Today's Date: 05/19/2018 SLP Individual Time: 1000-1100 SLP Individual Time Calculation (min): 60 min   Problem List:  Patient Active Problem List   Diagnosis Date Noted  . Acute ischemic right MCA stroke ([place]) 05/18/2018  . Chronic pain syndrome   . Diabetes mellitus type 2 in nonobese ([name])   . Sleep disturbance   . Delirium, drug-induced ([place])   . NSTEMI (non-ST elevated myocardial infarction) ([place]) 09/25/2013  . Coronary atherosclerosis of native coronary artery 09/25/2013  . HTN (hypertension) 09/25/2013  . Diabetes mellitus ([place]) 09/25/2013  . Tobacco abuse 09/25/2013   Past Medical History:  Past Medical History:  Diagnosis Date  . Anginal pain ([place])   . Arthritis   . CAD (coronary artery disease)    a. 09/25/13 s/p DES to [facility] and DES to pRCA.  Marland Kitchen Chronic left-sided back pain    has had ESI/Dr. Francesco Runner  . DM (diabetes mellitus) ([place])    a. diet controlled.   Marland Kitchen HTN (hypertension)   . Myocardial infarction [facility]) 09/2013   nstemi  . Tobacco abuse    Past Surgical History:  Past Surgical History:  Procedure Laterality Date  . ABDOMINAL HYSTERECTOMY    . ANKLE SURGERY    . APPENDECTOMY    . CORONARY ANGIOPLASTY  09/25/2013   des mid circumflex  des rca  . LEFT HEART CATHETERIZATION WITH CORONARY ANGIOGRAM N/A 09/25/2013   Procedure: LEFT HEART CATHETERIZATION WITH CORONARY ANGIOGRAM;  Surgeon: Burnell Blanks, MD;  Location: [facility] CATH LAB;  Service: Cardiovascular;  Laterality: N/A;  . skin grafts left arm      Assessment / Plan / Recommendation Clinical Impression Patient is a 68 year old female with history of CAD s/p stent, T2DM, tobacco abuse, chronic pain;  who was admitted to  [facility] 05/11/2018 via [facility] with LUE weakness and  numbness due to right MCA distribution infarct and right MCA M2 branch occlusion.  History taken from chart review and patient.  She was not felt to be a candidate for thrombectomy due to low NIHSS.  MRI brain done revealing large acute infarct right MCA distribution and small acute infarcts in left frontal and right parietal lobes with remote infarct left posterior parietal lobe and moderate chronic white matter disease. Neurology consult for input and felt patient with cardioembolic stroke and recommended 30-day heart monitor at discharge to rule out A. Fib. 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 Blood cultures negative and she was started IV heparin and  CT head repeated on 1/5 for monitoring and was negative for bleed.  She was transitioned to lovenox/coumadin bridge yesterday. Dr. Adonis Housekeeper with CT surgery consulted for input and recommends resection of mass 2 weeks past discharge from rehab. EEG done on 1/2 due to myoclonus type jerking of bilateral upper extremity.  LTM EEG done showing continuous right hemisphere slowing and myoclonic jerks without electrographic correlation.  She was treated with Keppra briefly and oxycodone/gabapentin discontinued with resolution of symptoms.  Tylenol added for pain management she has had issues with agitation due to delirium as well as sundowning with sleep deprivation.  She was started on Seroquel as well as melatonin to help with sleep-wake disruption. Blood sugars  were poorly controlled requiring Glucomander and now transitioned to metformin. Patient with resultant  left sensory deficits with decreased coordination LUE and mild weakness LLE. CIR recommended due to functional decline and patient admitted 05/18/2018.  Patient administered the MoCA-Version 7.3 and scored 18/30 points with a score of 26 or above considered normal. Patient demonstrates mild-moderate  cognitive deficits in the areas of executive functioning, visual-perception, selective attention, functional problem solving, short-term recall and emergent awareness which impacts her ability to complete functional and familiar tasks safely. Patient was 100% intelligible at the conversation level and both patient's auditory comprehension and verbal expression appeared [facility] for all tasks assessed.  Patient would benefit from skilled SLP intervention to maximize her cognitive functioning and overall functional independence prior to discharge.    Skilled Therapeutic Interventions          Administered a cognitive-linguistic evaluation, please see above for details. Educated patient in regards to her current cognitive impairments and goals of skilled SLP intervention. Patient verbalized understanding and agreement.   SLP Assessment  Patient will need skilled [place] Pathology Services during CIR admission    Recommendations  Oral Care Recommendations: Oral care BID Recommendations for Other Services: Neuropsych consult Patient destination: Home Follow up Recommendations: 24 hour supervision/assistance;Outpatient SLP;Home Health SLP Equipment Recommended: None recommended by SLP    SLP Frequency 3 to 5 out of 7 days   SLP Duration  SLP Intensity  SLP Treatment/Interventions 5-7 days   Minumum of 1-2 x/day, 30 to 90 minutes  Cognitive remediation/compensation;Internal/external aids;Environmental controls;Cueing hierarchy;Functional tasks;Patient/family education;Therapeutic Activities    Pain Pain Assessment Pain Scale: 0-10 Pain Score: 0-No pain  Prior Functioning Type of Home: House  Lives With: Spouse Available Help at Discharge: Family Vocation: Part time employment  Short Term Goals: Week 1: SLP Short Term Goal 1 (Week 1): STGs=LTGs due to short length of stay   Refer to Care Plan for Long Term Goals  Recommendations for other services: Neuropsych  Discharge  Criteria: Patient will be discharged from SLP if patient refuses treatment 3 consecutive times without medical reason, if treatment goals not met, if there is a change in medical status, if patient makes no progress towards goals or if patient is discharged from hospital.  The above assessment, treatment plan, treatment alternatives and goals were discussed and mutually agreed upon: by patient  Stori Royse 05/19/2018, 3:06 PM

## 2018-05-19 NOTE — Progress Notes (Signed)
Social Work  Social Work Assessment and Plan  Patient Details  Name: Rachel Perkins MRN: [id number] Date of Birth: 05-09-1951  Today's Date: 05/19/2018  Problem List:  Patient Active Problem List   Diagnosis Date Noted  . Acute ischemic right MCA stroke ([place]) 05/18/2018  . Chronic pain syndrome   . Diabetes mellitus type 2 in nonobese ([name])   . Sleep disturbance   . Delirium, drug-induced ([place])   . NSTEMI (non-ST elevated myocardial infarction) ([place]) 09/25/2013  . Coronary atherosclerosis of native coronary artery 09/25/2013  . HTN (hypertension) 09/25/2013  . Diabetes mellitus ([place]) 09/25/2013  . Tobacco abuse 09/25/2013   Past Medical History:  Past Medical History:  Diagnosis Date  . Anginal pain ([place])   . Arthritis   . CAD (coronary artery disease)    a. 09/25/13 s/p DES to [facility] and DES to pRCA.  Marland Kitchen Chronic left-sided back pain    has had ESI/Dr. Francesco Runner  . DM (diabetes mellitus) ([place])    a. diet controlled.   Marland Kitchen HTN (hypertension)   . Myocardial infarction [facility]) 09/2013   nstemi  . Tobacco abuse    Past Surgical History:  Past Surgical History:  Procedure Laterality Date  . ABDOMINAL HYSTERECTOMY    . ANKLE SURGERY    . APPENDECTOMY    . CORONARY ANGIOPLASTY  09/25/2013   des mid circumflex  des rca  . LEFT HEART CATHETERIZATION WITH CORONARY ANGIOGRAM N/A 09/25/2013   Procedure: LEFT HEART CATHETERIZATION WITH CORONARY ANGIOGRAM;  Surgeon: Burnell Blanks, MD;  Location: [facility] CATH LAB;  Service: Cardiovascular;  Laterality: N/A;  . skin grafts left arm     Social History:  reports that she has been smoking cigarettes. She started smoking about 53 years ago. She has a 25.00 pack-year smoking history. She has never used smokeless tobacco. She reports that she does not drink alcohol or use drugs.  Family / Support Systems Marital Status: Married How Long?: 24 years Patient Roles: Spouse, Other (Comment), Parent(employee) Spouse/Significant Other: Herbie [place]  [phone] Children: Four children between both fo them Other Supports: friends, and church members Anticipated Caregiver: Husband and children close by do work though Ability/Limitations of Caregiver: healthy and can prpvide assist Caregiver Availability: 24/7 Family Dynamics: Close knit family this is both of their second marriages but all four children get along and are involved. 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 Ptwould like husband involved if any decision nees to be made while here   Abuse/Neglect Abuse/Neglect Assessment Can Be Completed: Yes Physical Abuse: Denies Verbal Abuse: Denies Sexual Abuse: Denies Exploitation of patient/patient's resources: Denies Self-Neglect: Denies  Emotional Status Pt's affect, behavior and adjustment status: Pt reports she has always been independent and worked all of her life. She is usually the one to provide assistance to others. She is not used to this role. She is hopeful she will improve and regain her independence while here. Recent Psychosocial Issues: cardiac stent 3-4 yrs ago but has otherwise been healthly Psychiatric History: No history deferred depression screen due to coping appropriately and can verbalize her concerns. Will allow her time to adjust to the new unit  and see if would benefit from seeing neuro-psych while here. Substance Abuse History: Tobacco aware needs to quit and plans on doing this, aware of the health risks and for her cardiac issues. She is aware of the resources she has quit  before.  Patient / Family Perceptions, Expectations & Goals Pt/Family understanding of illness & functional limitations: Pt and husband can explain her stroke and deficits. Both have spoken with the MD and feel they have a good understanding of her treatment plan going forward. Both are hopeful she will do well here. 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."   Husband states: " She has made good progress already I hope it will continue."  [employer]: None Premorbid Home Care/DME Agencies: None Transportation available at discharge: Husband Resource referrals recommended: Support group (specify)  Discharge Planning Living Arrangements: Spouse/significant other Support Systems: Spouse/significant other, Children, Water engineer, Social worker community Type of Residence: Private residence Insurance underwriter Resources: [employer], Multimedia programmer (specify)([employer]) Museum/gallery curator Resources: Employment, Fish farm manager, Family Support Financial Screen Referred: No Living Expenses: Own Money Management: Spouse, Patient Does the patient have any problems obtaining your medications?: No Home Management: Both she and husband Patient/Family Preliminary Plans: Return home with husband who is in good health and can assist her if needed. They both have children who are supportive and will assist when they can, all work. Will await team evaluations and work on a discharge plan.  Social Work Anticipated Follow Up Needs: HH/OP, Support Group  Clinical Impression Pleasant female who is somewhat lethargic from her traveling here yesterday and also her medical condition. She feels it became worse when she started keppra. Her  husband and family are involved and supportive and willing to assist. She is hopeful she will not need assist and can recover and only need someone with her at discharge. Will await teams' evaluations and work on a safe discharge plan.  Elease Hashimoto 05/19/2018, 8:58 AM

## 2018-05-19 NOTE — Progress Notes (Signed)
Per nursing, patient was given "Data Collection Information Summary for Patients in Inpatient Rehabilitation Facilities with attached Privacy Act Statement Health Care Records" upon admission, which was provided in patient education notebook.    Patient information reviewed and entered into eRehab System by Becky Pamalee Marcoe, PPS coordinator. Information including medical coding, function ability, and quality indicators will be reviewed and updated through discharge.   

## 2018-05-19 NOTE — Progress Notes (Signed)
PHYSICAL MEDICINE & REHABILITATION PROGRESS NOTE   Subjective/Complaints:  No issues overnite, discussed chronic anterior shoulder pain, was scheduled for injection with ortho in [place]  ROS- neg CP, SOB, N/V/D  Objective:   No results found. Recent Labs    05/19/18 0601  WBC 8.6  HGB 14.8  HCT 43.9  PLT 210   No results for input(s): NA, K, CL, CO2, GLUCOSE, BUN, CREATININE, CALCIUM in the last 72 hours. No intake or output data in the 24 hours ending 05/19/18 0623   Physical Exam: Vital Signs Blood pressure 138/77, pulse 78, temperature 98.3 F (36.8 C), resp. rate 16, height 5\' 3"  (1.6 m), weight 61.6 kg, SpO2 99 %.   General: No acute distress Mood and affect are appropriate Heart: Regular rate and rhythm no rubs murmurs or extra sounds Lungs: Clear to auscultation, breathing unlabored, no rales or wheezes Abdomen: Positive bowel sounds, soft nontender to palpation, nondistended Extremities: No clubbing, cyanosis, or edema Skin: No evidence of breakdown, no evidence of rash Neurologic: Cranial nerves II through XII intact, motor strength is 5/5 in bilateral deltoid, bicep, tricep, grip, hip flexor, knee extensors, ankle dorsiflexor and plantar flexor Sensory exam normal sensation to light touch and proprioception in bilateral upper and lower extremities Cerebellar exam normal finger to nose to finger as well as heel to shin in bilateral upper and lower extremities Musculoskeletal: Full range of motion in all 4 extremities. No joint swelling   Assessment/Plan: 1. Functional deficits secondary to RIght MCA infarct which require 3+ hours per day of interdisciplinary therapy in a comprehensive inpatient rehab setting.  Physiatrist is providing close team supervision and 24 hour management of active medical problems listed below.  Physiatrist and rehab team continue to assess barriers to discharge/monitor patient progress toward functional and medical  goals  Care Tool:  Bathing              Bathing assist       Upper Body Dressing/Undressing Upper body dressing        Upper body assist      Lower Body Dressing/Undressing Lower body dressing            Lower body assist       Toileting Toileting    Toileting assist       Transfers Chair/bed transfer  Transfers assist           Locomotion Ambulation   Ambulation assist              Walk 10 feet activity   Assist           Walk 50 feet activity   Assist           Walk 150 feet activity   Assist           Walk 10 feet on uneven surface  activity   Assist           Wheelchair     Assist               Wheelchair 50 feet with 2 turns activity    Assist            Wheelchair 150 feet activity     Assist          Medical Problem List and Plan: 1.  Limitations with mobility, safety, self-care, cognitive deficits secondary to right MCA infarct. CIR PT, OT, SLP evals 2.  Fibroelastoma aortic valve/DVT Prophylaxis/Anticoagulation: Pharmaceutical: Coumadin and Lovenox bridge  till INR > 2.0 X 2 days.  3. Chronic back pain/Pain Management: Off gabapentin and oxycodone due to myoclonus.  4. Mood: LCSW to follow for evaluation and support.  5. Neuropsych: This patient is capable of making decisions on her own behalf. 6. Skin/Wound Care: Routine pressure relief measures.  7. Fluids/Electrolytes/Nutrition: Monitor I/O. Check lytes in am.  8. HTN: Monitor BP bid--continue metoprolol bid. Vitals:   05/18/18 2031 05/19/18 0542  BP: 128/88 138/77  Pulse: 93 78  Resp: 18 16  Temp: 98.8 F (37.1 C) 98.3 F (36.8 C)  SpO2: 98% 99%  Controlled 1/9 9. T2DM: Hgb A1c-7.0. Continue metformin daily. Monitor BS ac/hs.  CBG (last 3)  Recent Labs    05/18/18 1712 05/18/18 2157 05/19/18 0624  GLUCAP 103* 119* 107*  controlled 1/9 10.  Sleep disturbance: Continue melatonin at nights. Check sleep  wake chart 11. Delirium: Continue Seroquel bid for now.     LOS: 1 days A FACE TO [place] 05/19/2018, 6:37 AM

## 2018-05-19 NOTE — Progress Notes (Signed)
Physical Therapy Session Note  Patient Details  Name: Rachel Perkins MRN: [id number] Date of Birth: 20-Jul-1950  Today's Date: 05/19/2018 PT Individual Time: 1500-1530 PT Individual Time Calculation (min): 30 min   Short Term Goals: Week 1:  PT Short Term Goal 1 (Week 1): STG = LTG due to ELOS   Skilled Therapeutic Interventions/Progress Updates:    Pt received seated EOB from OT session, agreeable to PT. Sit to stand with CGA. Stand pivot transfer to w/c with CGA for balance. Dependent transport via w/c to/from therapy gym for time management. Static standing balance with CGA while reaching outside BOS and to the L with LUE, focus on grasping level green clothespins and placing on basketball net. Pt requires hand-over-hand assist and verbal cueing to perform action correctly due to decreased motor planning and delayed processing. Education with patient about what a stroke is and deficits she presents with. Also informed patient of stroke support group and that she and her husband are welcome to attend tonight if they are interested. Pt left seated in w/c in room with needs in reach, quick release belt and chair alarm in place at end of therapy session.  Therapy Documentation Precautions:  Precautions Precautions: Fall Precaution Comments: mild L inattention, mild apraxia, impulsivity  Restrictions Weight Bearing Restrictions: No Pain: Pain Assessment Pain Scale: 0-10 Pain Score: 0-No pain    Therapy/Group: Individual Therapy  Excell Seltzer, PT, DPT  05/19/2018, 4:56 PM

## 2018-05-19 NOTE — Progress Notes (Signed)
ANTICOAGULATION CONSULT NOTE - Initial Consult  Pharmacy Consult for Lovenox + Warfarin Indication: stroke & concern for R-mobile thrombus  Allergies  Allergen Reactions  . Codeine Itching  . Penicillins Rash    DID THE REACTION INVOLVE: Swelling of the face/tongue/throat, SOB, or low BP? Unknown Sudden or severe rash/hives, skin peeling, or the inside of the mouth or nose? Unknown Did it require medical treatment? Unknown When did it last happen?unk If all above answers are "NO", may proceed with cephalosporin use.   . Sulfa Antibiotics Rash  . Lisinopril Cough  . Other Other (See Comments)    NUTS ?LOBSTER unknown    Patient Measurements: Height: 5\' 3"  (160 cm) Weight: 135 lb 12.9 oz (61.6 kg) IBW/kg (Calculated) : 52.4 Ht: 5'4" Wt: 64.8 kg  Vital Signs: Temp: 98.3 F (36.8 C) (01/09 0542) Temp Source: Oral (01/08 2031) BP: 134/76 (01/09 0741) Pulse Rate: 76 (01/09 0741)  Labs: Recent Labs    05/19/18 0601  HGB 14.8  HCT 43.9  PLT 210  LABPROT 19.2*  INR 1.64  CREATININE 0.90    Estimated Creatinine Clearance: 48.8 mL/min (by C-G formula based on SCr of 0.9 mg/dL).   Medical History: Past Medical History:  Diagnosis Date  . Anginal pain ([place])   . Arthritis   . CAD (coronary artery disease)    a. 09/25/13 s/p DES to [facility] and DES to pRCA.  Marland Kitchen Chronic left-sided back pain    has had ESI/Dr. Francesco Runner  . DM (diabetes mellitus) ([place])    a. diet controlled.   Marland Kitchen HTN (hypertension)   . Myocardial infarction [facility]) 09/2013   nstemi  . Tobacco abuse     Assessment: 68 YOF transferred to CIR from [place] on 1/8 for rehab s/p recent CVA on 1/1. The patient was also found to have a possible R-mobile thrombus and had been receiving Lovenox bridge with therapeutic INR at [place] consulted to resume dosing this admission.  On enoxaparin + Coumadin for new CVA, R-heart mobile lesion/thrombus. INR up to 1.64. CBC stable.  Goal of Therapy:  INR  2-3 Anti-Xa level 0.6-1 units/ml 4hrs after LMWH dose given Monitor platelets by anticoagulation protocol: Yes   Plan:  Continue enoxaparin 60mg  [place] Q12h Give Coumadin 5mg  PO x 1 Monitor daily INR, CBC, s/s of bleed  Elenor Quinones, PharmD, BCPS, [facility] Clinical Pharmacist Phone number [phone] 05/19/2018 8:11 AM

## 2018-05-19 NOTE — Care Management Note (Signed)
[facility] Individual Statement of Services  Patient Name:  Rachel Perkins  Date:  05/19/2018  Welcome to the [facility].  Our goal is to provide you with an individualized program based on your diagnosis and situation, designed to meet your specific needs.  With this comprehensive rehabilitation program, you will be expected to participate in at least 3 hours of rehabilitation therapies Monday-Friday, with modified therapy programming on the weekends.  Your rehabilitation program will include the following services:  Physical Therapy (PT), Occupational Therapy (OT), Speech Therapy (ST), 24 hour per day rehabilitation nursing, Therapeutic Recreaction (TR), Case Management (Social Worker), Rehabilitation Medicine, Nutrition Services and Pharmacy Services  Weekly team conferences will be held on Wednesday to discuss your progress.  Your Social Worker will talk with you frequently to get your input and to update you on team discussions.  Team conferences with you and your family in attendance may also be held.  Expected length of stay: supervision-mod/i with cues  Overall anticipated outcome: 6-8 days  Depending on your progress and recovery, your program may change. Your Social Worker will coordinate services and will keep you informed of any changes. Your Social Worker's name and contact numbers are listed  below.  The following services may also be recommended but are not provided by the [place] will be made to provide these services after discharge if needed.  Arrangements include referral to agencies that provide these services.  Your insurance has been verified to be:  [place] Your primary doctor is:  Monico Blitz  Pertinent information will be shared with your  doctor and your insurance company.  Social Worker:  Ovidio Kin, [place] or ([phone]  Information discussed with and copy given to patient by: Elease Hashimoto, 05/19/2018, 9:01 AM

## 2018-05-19 NOTE — Evaluation (Signed)
Physical Therapy Assessment and Plan  Patient Details  Name: Rachel Perkins MRN: [id number] Date of Birth: October 28, 1950  PT Diagnosis: Abnormality of gait, Coordination disorder, Hemiplegia non-dominant and Muscle weakness Rehab Potential: Good ELOS: 5-7 days    Today's Date: 05/19/2018 PT Individual Time: 0800-0900 PT Individual Time Calculation (min): 60 min    Problem List:  Patient Active Problem List   Diagnosis Date Noted  . Acute ischemic right MCA stroke ([place]) 05/18/2018  . Chronic pain syndrome   . Diabetes mellitus type 2 in nonobese ([name])   . Sleep disturbance   . Delirium, drug-induced ([place])   . NSTEMI (non-ST elevated myocardial infarction) ([place]) 09/25/2013  . Coronary atherosclerosis of native coronary artery 09/25/2013  . HTN (hypertension) 09/25/2013  . Diabetes mellitus ([place]) 09/25/2013  . Tobacco abuse 09/25/2013    Past Medical History:  Past Medical History:  Diagnosis Date  . Anginal pain ([place])   . Arthritis   . CAD (coronary artery disease)    a. 09/25/13 s/p DES to [facility] and DES to pRCA.  Mar[place] Kitchen Chronic left-sided back pain    has had ESI/Dr. Francesco Runner  . DM (diabetes mellitus) ([place])    a. diet controlled.   Marland Kitchen HTN (hypertension)   . Myocardial infarction [facility]) 09/2013   nstemi  . Tobacco abuse    Past Surgical History:  Past Surgical History:  Procedure Laterality Date  . ABDOMINAL HYSTERECTOMY    . ANKLE SURGERY    . APPENDECTOMY    . CORONARY ANGIOPLASTY  09/25/2013   des mid circumflex  des rca  . LEFT HEART CATHETERIZATION WITH CORONARY ANGIOGRAM N/A 09/25/2013   Procedure: LEFT HEART CATHETERIZATION WITH CORONARY ANGIOGRAM;  Surgeon: Burnell Blanks, MD;  Location: [facility] CATH LAB;  Service: Cardiovascular;  Laterality: N/A;  . skin grafts left arm      Assessment & Plan Clinical Impression:  Rachel Perkins is a 68 year old female with history of CAD s/p stent, T2DM, tobacco abuse, chronic pain;  who was admitted to  [facility] 05/11/2018  via [facility] with LUE weakness and numbness due to right MCA distribution infarct and right MCA M2 branch occlusion.  History taken from chart review and patient.  She was not felt to be a candidate for thrombectomy due to low NIHSS.  MRI brain done revealing large acute infarct right MCA distribution and small acute infarcts in left frontal and right parietal lobes with remote infarct left posterior parietal lobe and moderate chronic white matter disease.  Neurology consult for input and felt patient with cardioembolic stroke and recommended 30-day heart monitor at discharge to rule out A. Fib. 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 Blood cultures negative and she was started IV heparin and  CT head repeated on 1/5 for monitoring and was negative for bleed.  She was transitioned to lovenox/coumadin bridge yesterday. Dr. Adonis Housekeeper with CT surgery consulted for input and recommends resection of mass 2 weeks past discharge from rehab.  EEG done on 1/2 due to myoclonus type jerking of bilateral upper extremity.  LTM EEG done showing continuous right hemisphere slowing and myoclonic jerks without electrographic correlation.  She was treated with Keppra briefly and oxycodone/gabapentin discontinued with resolution of symptoms.  Tylenol added for pain management she has had issues with agitation due to delirium as well as sundowning with sleep deprivation.  She was started on Seroquel as  well as melatonin to help with sleep-wake disruption. Blood sugars were poorly controlled requiring Glucomander and now transitioned to metformin. Patient with resultant  left sensory deficits with decreased coordination LUE and mild weakness LLE. CIR recommended due to functional decline.  Please also see preadmission assessment. Patient transferred to CIR on 05/18/2018 .   Patient currently requires min with mobility  secondary to muscle weakness, decreased cardiorespiratoy endurance, motor apraxia and decreased coordination, decreased attention to left and decreased motor planning and decreased sitting balance, decreased standing balance, hemiplegia and decreased balance strategies.  Prior to hospitalization, patient was independent  with mobility and lived with Spouse in a House home.  Home access is 2Stairs to enter.  Patient will benefit from skilled PT intervention to maximize safe functional mobility, minimize fall risk and decrease caregiver burden for planned discharge home with 24 hour supervision.  Anticipate patient will benefit from follow up OP at discharge.  PT - End of Session Activity Tolerance: Tolerates 30+ min activity with multiple rests Endurance Deficit: No PT Assessment Rehab Potential (ACUTE/IP ONLY): Good PT Patient demonstrates impairments in the following area(s): Balance;Safety;Endurance;Motor;Perception PT Transfers Functional Problem(s): Bed Mobility;Bed to Chair;Car;Furniture;Floor PT Locomotion Functional Problem(s): Ambulation;Stairs;Wheelchair Mobility PT Plan PT Intensity: Minimum of 1-2 x/day ,45 to 90 minutes PT Frequency: 5 out of 7 days PT Duration Estimated Length of Stay: 5-7 days  PT Treatment/Interventions: Ambulation/gait training;Community reintegration;DME/adaptive equipment instruction;Neuromuscular re-education;Psychosocial support;Stair training;UE/LE Strength taining/ROM;Wheelchair propulsion/positioning;Balance/vestibular training;Discharge planning;Functional electrical stimulation;Pain management;Skin care/wound management;Therapeutic Activities;UE/LE Coordination activities;Cognitive remediation/compensation;Disease management/prevention;Functional mobility training;Patient/family education;Splinting/orthotics;Therapeutic Exercise;Visual/perceptual remediation/compensation PT Transfers Anticipated Outcome(s): Mod(I), LRAD  PT Locomotion Anticipated  Outcome(s): Mod(I), LRAD  PT Recommendation Recommendations for Other Services: Therapeutic Recreation consult Therapeutic Recreation Interventions: Pet therapy;Kitchen group;Outing/community reintergration Follow Up Recommendations: Outpatient PT Patient destination: Home Equipment Recommended: 3 in 1 bedside comode;Rolling walker with 5" wheels;Tub/shower bench  Skilled Therapeutic Intervention Patient received in bed, pleasant and motivated to participate in PT. Able to complete bed mobility with S, functional transfers with min guard/no device, gait approximately 139f with min guard/no device, steps with B railings and min guard during session; note she did require MinA to navigate single step without railings however. Throughout evaluation noted some mild apraxia but able to work through it with extended time and occasional cues from PT, also mild L inattention during functional task performance. Only able to score 35/56 on Berg balance test, also mildly impulsive during this evaluation. Education provided regarding PT findings, general safety precautions and routine of therapy care in CIR, and potential length of stay per PT assessment. She was left up in her WC with all needs met, seat belt alarm active, and all questions/concerns addressed this morning.   PT Evaluation Precautions/Restrictions Precautions Precautions: Fall Precaution Comments: mild L inattention, mild apraxia, impulsivity  Restrictions Weight Bearing Restrictions: No General Chart Reviewed: Yes Family/Caregiver Present: No Vital Signs  Pain Pain Assessment Pain Scale: 0-10 Pain Score: 0-No pain Home Living/Prior Functioning Home Living Available Help at Discharge: Family Type of Home: House Home Access: Stairs to enter ETechnical brewerof Steps: 2 Entrance Stairs-Rails: Right Home Layout: One level Bathroom Shower/Tub: Tub/shower unit(has hand held showerhead) Bathroom Toilet: Standard Bathroom  Accessibility: Yes Additional Comments: no Home services currently   Lives With: Spouse Prior Function Level of Independence: Independent with transfers;Independent with gait;Independent with basic ADLs;Independent with homemaking with ambulation  Able to Take Stairs?: Yes Driving: Yes Vocation: Part time employment 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#, works 5 hrs/day Leisure: Hobbies-yes (  Comment) Comments: watching TV/movies  Vision/Perception  Perception Perception: Within Functional Limits Praxis Praxis: Intact Praxis Impairment Details: Initiation Praxis-Other Comments: appears to have some mild apraxia but able to work through it with extended time and occasional cues from PT   Cognition Overall Cognitive Status: Impaired/Different from baseline Arousal/Alertness: Awake/alert Orientation Level: Oriented to situation;Oriented to person;Oriented to place;Disoriented to time Attention: Selective Selective Attention: Impaired Selective Attention Impairment: Functional complex Memory: Impaired Memory Impairment: Decreased short term memory Decreased Short Term Memory: Functional complex Awareness: Impaired Awareness Impairment: Emergent impairment Problem Solving: Impaired Problem Solving Impairment: Functional complex Behaviors: Impulsive Safety/Judgment: Impaired Sensation Sensation Light Touch: Impaired Detail Light Touch Impaired Details: Absent LUE Hot/Cold: Impaired by gross assessment Proprioception: Impaired by gross assessment Stereognosis: Impaired Detail Stereognosis Impaired Details: Absent LUE Coordination Gross Motor Movements are Fluid and Coordinated: No Fine Motor Movements are Fluid and Coordinated: No Coordination and Movement Description: limited by weakness L UE  Finger Nose Finger Test: WNL on Rt, discoordination in LUE Heel Shin Test: WNL  9 Hole Peg Test: Rt: 36 seconds, Lt: 1:16 Motor  Motor Motor:  Hemiplegia Motor - Skilled Clinical Observations: increased weakness L UE>L LE   Mobility Bed Mobility Bed Mobility: Rolling Right;Rolling Left;Supine to Sit;Sit to Supine Rolling Right: Supervision/verbal cueing Rolling Left: Supervision/Verbal cueing Supine to Sit: Supervision/Verbal cueing Sit to Supine: Supervision/Verbal cueing Transfers Transfers: Sit to Stand;Stand to Sit;Stand Pivot Transfers Sit to Stand: Contact Guard/Touching assist Stand to Sit: Contact Guard/Touching assist Stand Pivot Transfers: Contact Guard/Touching assist Transfer (Assistive device): None Locomotion  Gait Ambulation: Yes Gait Assistance: Contact Guard/Touching assist Gait Distance (Feet): 150 Feet Assistive device: None Gait Gait: Yes Gait Pattern: Impaired Gait Pattern: Decreased step length - right;Decreased stance time - left;Trendelenburg;Decreased trunk rotation;Trunk flexed;Narrow base of support Stairs / Additional Locomotion Stairs: Yes Stairs Assistance: Contact Guard/Touching assist Stair Management Technique: Two rails Number of Stairs: 12 Height of Stairs: 6 Ramp: Contact Guard/touching assist Curb: Minimal Assistance - Patient >75% Wheelchair Mobility Wheelchair Mobility: Yes Wheelchair Assistance: Minimal assistance - Patient >75% Wheelchair Propulsion: Both upper extremities Wheelchair Parts Management: Other (comment)(DNT ) Distance: 38f   Trunk/Postural Assessment  Cervical Assessment Cervical Assessment: Within Functional Limits Thoracic Assessment Thoracic Assessment: Within Functional Limits Lumbar Assessment Lumbar Assessment: Within Functional Limits Postural Control Postural Control: Deficits on evaluation  Balance Balance Balance Assessed: Yes Standardized Balance Assessment Standardized Balance Assessment: Berg Balance Test Extremity Assessment  RUE Assessment RUE Assessment: Within Functional Limits LUE Assessment LUE Assessment: Exceptions to  WSouth [facility] CenterPassive Range of Motion (PROM) Comments: WFL Active Range of Motion (AROM) Comments: WFL General Strength Comments: generalized weakness grossly 4/5 (pt does have history of polio as a child that affected Lt side).   LUE Body System: Neuro Brunstrum levels for arm and hand: Arm;Hand Brunstrum level for arm: Stage IV Movement is deviating from synergy Brunstrum level for hand: Stage V Independence from basic synergies RLE Assessment RLE Assessment: Within Functional Limits General Strength Comments: ankle DF 5/5, quad 4+/5, hip flexor 4/5, hip abduction seated 4/5  LLE Assessment LLE Assessment: Exceptions to WUniversity [facility] Of DentonGeneral Strength Comments: ankle DF 5/5, quad 4/5, hip flexor 3+5, seated hip abduction 3+/5     Refer to Care Plan for Long Term Goals  Recommendations for other services: Therapeutic Recreation  Pet therapy, Kitchen group and Outing/community reintegration  Discharge Criteria: Patient will be discharged from PT if patient refuses treatment 3 consecutive times without medical reason, if treatment goals not met, if there is a change  in medical status, if patient makes no progress towards goals or if patient is discharged from hospital.  The above assessment, treatment plan, treatment alternatives and goals were discussed and mutually agreed upon: by patient  Deniece Ree PT, DPT, CBIS  Supplemental Physical Therapist [facility]    Pager [phone] Acute Rehab Office [phone]

## 2018-05-20 ENCOUNTER — Inpatient Hospital Stay (HOSPITAL_COMMUNITY): Payer: Medicare Other | Admitting: Physical Therapy

## 2018-05-20 ENCOUNTER — Inpatient Hospital Stay (HOSPITAL_COMMUNITY): Payer: Medicare Other | Admitting: Speech Pathology

## 2018-05-20 ENCOUNTER — Inpatient Hospital Stay (HOSPITAL_COMMUNITY): Payer: Medicare Other

## 2018-05-20 DIAGNOSIS — I69398 Other sequelae of cerebral infarction: Secondary | ICD-10-CM

## 2018-05-20 DIAGNOSIS — I69393 Ataxia following cerebral infarction: Secondary | ICD-10-CM

## 2018-05-20 DIAGNOSIS — R209 Unspecified disturbances of skin sensation: Secondary | ICD-10-CM

## 2018-05-20 LAB — PROTIME-INR
INR: 2.14
Prothrombin Time: 23.6 seconds — ABNORMAL HIGH (ref 11.4–15.2)

## 2018-05-20 LAB — GLUCOSE, CAPILLARY
Glucose-Capillary: 109 mg/dL — ABNORMAL HIGH (ref 70–99)
Glucose-Capillary: 122 mg/dL — ABNORMAL HIGH (ref 70–99)
Glucose-Capillary: 109 mg/dL — ABNORMAL HIGH (ref 70–99)
Glucose-Capillary: 50 mg/dL — ABNORMAL LOW (ref 70–99)
Glucose-Capillary: 100 mg/dL — ABNORMAL HIGH (ref 70–99)

## 2018-05-20 MED ORDER — WARFARIN SODIUM 2.5 MG PO TABS
2.5000 mg | ORAL_TABLET | Freq: Once | ORAL | Status: AC
  Administered 2018-05-20: 2.5 mg via ORAL
  Filled 2018-05-20: qty 1

## 2018-05-20 NOTE — Progress Notes (Signed)
[place] PHYSICAL MEDICINE & REHABILITATION PROGRESS NOTE   Subjective/Complaints:  Seen in PT , standoing on foam mat making a puzzle, discussed sensory impairments on Left side. Knocks objects off tray several times a day  ROS- neg CP, SOB, N/V/D  Objective:   No results found. Recent Labs    05/19/18 0601  WBC 8.6  HGB 14.8  HCT 43.9  PLT 210   Recent Labs    05/19/18 0601  NA 137  K 4.2  CL 105  CO2 23  GLUCOSE 111*  BUN 15  CREATININE 0.90  CALCIUM 9.4    Intake/Output Summary (Last 24 hours) at 05/20/2018 0900 Last data filed at 05/20/2018 0700 Gross per 24 hour  Intake 360 ml  Output -  Net 360 ml     Physical Exam: Vital Signs Blood pressure 116/81, pulse 76, temperature 98.1 F (36.7 C), temperature source Oral, resp. rate 18, height 5\' 3"  (1.6 m), weight 61.6 kg, SpO2 99 %.   General: No acute distress Mood and affect are appropriate Heart: Regular rate and rhythm no rubs murmurs or extra sounds Lungs: Clear to auscultation, breathing unlabored, no rales or wheezes Abdomen: Positive bowel sounds, soft nontender to palpation, nondistended Extremities: No clubbing, cyanosis, or edema Skin: No evidence of breakdown, no evidence of rash Neurologic: Cranial nerves II through XII intact, motor strength is 5/5 in bilateral deltoid, bicep, tricep, grip, hip flexor, knee extensors, ankle dorsiflexor and plantar flexor Sensory exam normal sensation to light touch and proprioception in Right upper and lower extremities Absent ;eft upper and lower ext Sensory ataxia LUE  Musculoskeletal: Full range of motion in all 4 extremities. No joint swelling   Assessment/Plan: 1. Functional deficits secondary to RIght MCA infarct which require 3+ hours per day of interdisciplinary therapy in a comprehensive inpatient rehab setting.  Physiatrist is providing close team supervision and 24 hour management of active medical problems listed below.  Physiatrist and  rehab team continue to assess barriers to discharge/monitor patient progress toward functional and medical goals  Care Tool:  Bathing    Body parts bathed by patient: Right arm, Left arm, Chest, Abdomen, Front perineal area, Buttocks, Right upper leg, Left upper leg, Right lower leg, Left lower leg, Face         Bathing assist Assist Level: Minimal Assistance - Patient > 75%     Upper Body Dressing/Undressing Upper body dressing   What is the patient wearing?: Pull over shirt    Upper body assist Assist Level: Minimal Assistance - Patient > 75%    Lower Body Dressing/Undressing Lower body dressing      What is the patient wearing?: Underwear/pull up, Pants     Lower body assist Assist for lower body dressing: Minimal Assistance - Patient > 75%     Toileting Toileting    Toileting assist Assist for toileting: Minimal Assistance - Patient > 75%     Transfers Chair/bed transfer  Transfers assist     Chair/bed transfer assist level: Contact Guard/Touching assist     Locomotion Ambulation   Ambulation assist      Assist level: Contact Guard/Touching assist Assistive device: Other (comment)(none ) Max distance: 166ft   Walk 10 feet activity   Assist     Assist level: Contact Guard/Touching assist Assistive device: Other (comment)(none )   Walk 50 feet activity   Assist    Assist level: Contact Guard/Touching assist Assistive device: Other (comment)(none )    Walk 150 feet activity  Assist    Assist level: Contact Guard/Touching assist Assistive device: Other (comment)(none )    Walk 10 feet on uneven surface  activity   Assist     Assist level: Contact Guard/Touching assist Assistive device: Other (comment)(none )   Wheelchair     Assist Will patient use wheelchair at discharge?: No   Wheelchair activity did not occur: N/A         Wheelchair 50 feet with 2 turns activity    Assist    Wheelchair 50 feet with 2  turns activity did not occur: N/A       Wheelchair 150 feet activity     Assist Wheelchair 150 feet activity did not occur: N/A        Medical Problem List and Plan: 1.  Limitations with mobility, safety, self-care, cognitive deficits secondary to right MCA infarct. CIR PT, OT, SLP evals 2.  Fibroelastoma aortic valve/DVT Prophylaxis/Anticoagulation: Pharmaceutical: Coumadin and Lovenox bridge till INR > 2.0 X 2 days. plt normal hope to d/c lovenox in next 1-2 d 3. Chronic back pain/Pain Management: Off gabapentin and oxycodone due to myoclonus.  4. Mood: LCSW to follow for evaluation and support.  5. Neuropsych: This patient is capable of making decisions on her own behalf. 6. Skin/Wound Care: Routine pressure relief measures.  7. Fluids/Electrolytes/Nutrition: Monitor I/O. Check lytes in am.  8. HTN: Monitor BP bid--continue metoprolol bid. Vitals:   05/20/18 0527 05/20/18 0730  BP: 116/81   Pulse: 88 76  Resp: 18   Temp: 98.1 F (36.7 C)   SpO2: 99%   Controlled 1/10 9. T2DM: Hgb A1c-7.0. Continue metformin daily. Monitor BS ac/hs.  CBG (last 3)  Recent Labs    05/19/18 1656 05/19/18 2117 05/20/18 0651  GLUCAP 117* 136* 109*  controlled 1/10 10.  Sleep disturbance: Continue melatonin at nights. Check sleep wake chart 11. Delirium: Continue Seroquel bid for now.   12.  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

## 2018-05-20 NOTE — IPOC Note (Signed)
Overall Plan of Care [facility]) Patient Details Name: Rachel Perkins MRN: [id number] DOB: 1950-12-08  Admitting Diagnosis: <principal problem not specified>  Hospital Problems: Active Problems:   Acute ischemic right MCA stroke ([place])   Chronic pain syndrome   Diabetes mellitus type 2 in nonobese [facility])   Sleep disturbance   Delirium, drug-induced ([place])     Functional Problem List: Nursing Endurance, Medication Management, Nutrition, Pain, Safety  PT Balance, Safety, Endurance, Motor, Perception  OT Balance, Cognition, Motor, Perception, Safety, Skin Integrity, Sensory  SLP Cognition  TR         Basic ADL's: OT Bathing, Grooming, Dressing, Toileting     Advanced  ADL's: OT Simple Meal Preparation, Light Housekeeping     Transfers: PT Bed Mobility, Bed to Chair, Car, [employer], Futures trader, Metallurgist: PT Ambulation, Stairs, Emergency planning/management officer     Additional Impairments: OT Fuctional Use of Upper Extremity  SLP Social Cognition   Awareness, Attention, Memory, Problem Solving  TR      Anticipated Outcomes Item Anticipated Outcome  Self Feeding Mod I  Swallowing      Basic self-care  Mod I  Toileting  Mod I   Bathroom Transfers Mod I - Supervision shower  Bowel/Bladder  Patient will remain continent of bowel and bladder with min assist  Transfers  Mod(I), LRAD   Locomotion  Mod(I), LRAD   Communication     Cognition  Supervision   Pain  pain less than or equal to 4/10 with min assist  Safety/Judgment  patient will be free from falls/injury and displaying appropriate safety decisions with min assist   Therapy Plan: PT Intensity: Minimum of 1-2 x/day ,45 to 90 minutes PT Frequency: 5 out of 7 days PT Duration Estimated Length of Stay: 5-7 days  OT Intensity: Minimum of 1-2 x/day, 45 to 90 minutes OT Frequency: 5 out of 7 days OT Duration/Estimated Length of Stay: 7-9 days SLP Intensity: Minumum of 1-2 x/day, 30 to 90 minutes SLP  Frequency: 3 to 5 out of 7 days SLP Duration/Estimated Length of Stay: 5-7 days     Team Interventions: Nursing Interventions Patient/Family Education, Disease Management/Prevention, Discharge Planning, Pain Management, Psychosocial Support  PT interventions Ambulation/gait training, Community reintegration, DME/adaptive equipment instruction, Neuromuscular re-education, Psychosocial support, Stair training, UE/LE Strength taining/ROM, Wheelchair propulsion/positioning, Training and development officer, Discharge planning, Functional electrical stimulation, Pain management, Skin care/wound management, Therapeutic Activities, UE/LE Coordination activities, Cognitive remediation/compensation, Disease management/prevention, Functional mobility training, Patient/family education, Splinting/orthotics, Therapeutic Exercise, Visual/perceptual remediation/compensation  OT Interventions Training and development officer, Cognitive remediation/compensation, Community reintegration, Discharge planning, DME/adaptive equipment instruction, Functional mobility training, Neuromuscular re-education, Patient/family education, Psychosocial support, Self Care/advanced ADL retraining, Skin care/wound managment, Therapeutic Activities, Therapeutic Exercise, UE/LE Strength taining/ROM, UE/LE Coordination activities  SLP Interventions Cognitive remediation/compensation, Internal/external aids, Environmental controls, Cueing hierarchy, Functional tasks, Patient/family education, Therapeutic Activities  TR Interventions    SW/CM Interventions Discharge Planning, Psychosocial Support, Patient/Family Education   Barriers to Discharge MD  Medical stability  Nursing      PT      OT      SLP      SW       Team Discharge Planning: Destination: PT-Home ,OT- Home , SLP-Home Projected Follow-up: PT-Outpatient PT, OT-  Outpatient OT, SLP-24 hour supervision/assistance, Outpatient SLP, Home Health SLP Projected Equipment Needs: PT-3 in  1 bedside comode, Rolling walker with 5" wheels, Tub/shower bench, OT- Tub/shower seat, Tub/shower bench, To be determined, SLP-None recommended by SLP Equipment Details:  PT- , OT-  Patient/family involved in discharge planning: PT- Patient,  OT-Patient, Family member/caregiver, SLP-Patient  MD ELOS: 7-10d Medical Rehab Prognosis:  Good Assessment:  68 year old female with history of CAD s/p stent, T2DM, tobacco abuse, chronic pain;  who was admitted to  [facility] 05/11/2018 via [facility] with LUE weakness and numbness due to right MCA distribution infarct and right MCA M2 branch occlusion.  History taken from chart review and patient.  She was not felt to be a candidate for thrombectomy due to low NIHSS.  MRI brain done revealing large acute infarct right MCA distribution and small acute infarcts in left frontal and right parietal lobes with remote infarct left posterior parietal lobe and moderate chronic white matter disease.  Neurology consult for input and felt patient with cardioembolic stroke and recommended 30-day heart monitor at discharge to rule out A. Fib. 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 Blood cultures negative and she was started IV heparin and  CT head repeated on 1/5 for monitoring and was negative for bleed.  She was transitioned to lovenox/coumadin bridge yesterday. Dr. Adonis Housekeeper with CT surgery consulted for input and recommends resection of mass 2 weeks past discharge from rehab.  EEG done on 1/2 due to myoclonus type jerking of bilateral upper extremity.  LTM EEG done showing continuous right hemisphere slowing and myoclonic jerks without electrographic correlation.  She was treated with Keppra briefly and oxycodone/gabapentin discontinued with resolution of symptoms.  Tylenol added for pain management she has had issues with agitation due to delirium as well as  sundowning with sleep deprivation.  She was started on Seroquel as well as melatonin to help with sleep-wake disruption. Blood sugars were poorly controlled requiring Glucomander and now transitioned to metformin. Patient with resultant  left sensory deficits with decreased coordination LUE and mild weakness LLE.    Now requiring 24/7 Rehab RN,MD, as well as CIR level PT, OT and SLP.  Treatment team will focus on ADLs and mobility with goals set at Mod I See Team Conference Notes for weekly updates to the plan of care

## 2018-05-20 NOTE — Progress Notes (Signed)
Physical Therapy Session Note  Patient Details  Name: Rachel Perkins MRN: [id number] Date of Birth: 30-Sep-1950  Today's Date: 05/20/2018 PT Individual Time: 0845-0955 PT Individual Time Calculation (min): 70 min   Short Term Goals: Week 1:  PT Short Term Goal 1 (Week 1): STG = LTG due to ELOS   Skilled Therapeutic Interventions/Progress Updates:   Pt in w/c and agreeable to therapy, denies pain. Ambulated around unit to/from therapy gym w/ CGA to close supervision w/ RW, 100-150' at a time. Worked on standing balance tasks and dynamic gait this session. Balance tasks emphasized visual scanning and attending to L side of body and L environment. Stood to [employer] cards on firm surface and stood to put vertical puzzle together and then peg board on foam surface. CGA for all balance tasks for safety and verbal cues to utilize LUE more often. Min-mod verbal and visual cues throughout to scan to L side of table, for sustained attention, and for problem solving. Intermittent seated rest breaks throughout session, however standing 10+ min at a time w/ only mild increase in fatigue. Returned to room and ended session in w/c, all needs in reach.   Therapy Documentation Precautions:  Precautions Precautions: Fall Precaution Comments: mild L inattention, mild apraxia, impulsivity  Restrictions Weight Bearing Restrictions: No Vital Signs: Therapy Vitals Pulse Rate: 76 Pain: Pain Assessment Pain Scale: 0-10 Pain Score: 0-No pain  Therapy/Group: Individual Therapy  Jaimee Corum K Jalyiah Shelley 05/20/2018, 9:59 AM

## 2018-05-20 NOTE — Progress Notes (Signed)
Speech Language Pathology Daily Session Note  Patient Details  Name: Rachel Perkins MRN: [id number] Date of Birth: 1950-07-16  Today's Date: 05/20/2018 SLP Individual Time: 0730-0830 SLP Individual Time Calculation (min): 60 min  Short Term Goals: Week 1: SLP Short Term Goal 1 (Week 1): STGs=LTGs due to short length of stay   Skilled Therapeutic Interventions: Skilled treatment session focused on cognitive goals. SLP facilitated session by providing Min A verbal cues for attention to LUE and left field of environment during tray set-up and self-feeding. SLP also facilitated session by providing Min A verbal cues and extra time for problem solving and organization during a basic money management task in a minimally distracting environment. Patient requested to donn shoes and transfer to wheelchair at end of session with Mod A verbal cues needed for safety. Patient left upright in wheelchair with alarm on and all needs within reach. Continue with current plan of care.      Pain No/Denies Pain   Therapy/Group: Individual Therapy  Julius Matus 05/20/2018, 12:32 PM

## 2018-05-20 NOTE — Progress Notes (Signed)
ANTICOAGULATION CONSULT NOTE - Initial Consult  Pharmacy Consult for Lovenox + Warfarin Indication: stroke & concern for R-mobile thrombus  Allergies  Allergen Reactions  . Codeine Itching  . Penicillins Rash    DID THE REACTION INVOLVE: Swelling of the face/tongue/throat, SOB, or low BP? Unknown Sudden or severe rash/hives, skin peeling, or the inside of the mouth or nose? Unknown Did it require medical treatment? Unknown When did it last happen?unk If all above answers are "NO", may proceed with cephalosporin use.   . Sulfa Antibiotics Rash  . Lisinopril Cough  . Other Other (See Comments)    NUTS ?LOBSTER unknown    Patient Measurements: Height: 5\' 3"  (160 cm) Weight: 135 lb 12.9 oz (61.6 kg) IBW/kg (Calculated) : 52.4 Ht: 5'4" Wt: 64.8 kg  Vital Signs: Temp: 98.1 F (36.7 C) (01/10 0527) Temp Source: Oral (01/10 0527) BP: 116/81 (01/10 0527) Pulse Rate: 76 (01/10 0730)  Labs: Recent Labs    05/19/18 0601 05/20/18 0721  HGB 14.8  --   HCT 43.9  --   PLT 210  --   LABPROT 19.2* 23.6*  INR 1.64 2.14  CREATININE 0.90  --     Estimated Creatinine Clearance: 48.8 mL/min (by C-G formula based on SCr of 0.9 mg/dL).   Medical History: Past Medical History:  Diagnosis Date  . Anginal pain ([place])   . Arthritis   . CAD (coronary artery disease)    a. 09/25/13 s/p DES to [place] and DES to pRCA.  Marland Kitchen Chronic left-sided back pain    has had ESI/Dr. Francesco Runner  . DM (diabetes mellitus) ([place])    a. diet controlled.   Marland Kitchen HTN (hypertension)   . Myocardial infarction [facility]) 09/2013   nstemi  . Tobacco abuse     Assessment: 30 YOF transferred to CIR from [place] on 1/8 for rehab s/p recent CVA on 1/1. The patient was also found to have a possible R-mobile thrombus and had been receiving Lovenox bridge with therapeutic INR at [place] consulted to resume dosing this admission.  INR up to 2.14. CBC stable.  Goal of Therapy:  INR 2-3 Anti-Xa level 0.6-1  units/ml 4hrs after LMWH dose given Monitor platelets by anticoagulation protocol: Yes   Plan:  Continue enoxaparin 60mg  [place]el Q12h Give Coumadin 2.5mg  PO x 1 Monitor daily INR, CBC, s/s of bleed  Will stop enoxaparin after tomorrow if INR remains therapeutic  Elenor Quinones, PharmD, BCPS, [facility] Clinical Pharmacist Phone number [phone] 05/20/2018 8:19 AM

## 2018-05-20 NOTE — Plan of Care (Signed)
  Problem: RH BOWEL ELIMINATION Goal: RH STG MANAGE BOWEL WITH ASSISTANCE Description STG Manage Bowel with min Assistance.  Outcome: Progressing Goal: RH STG MANAGE BOWEL W/MEDICATION W/ASSISTANCE Description STG Manage Bowel with Medication with min Assistance.  Outcome: Progressing   Problem: RH SKIN INTEGRITY Goal: RH STG SKIN FREE OF INFECTION/BREAKDOWN Description Will have no acquired skin break down in 14 days  Outcome: Progressing Goal: RH STG MAINTAIN SKIN INTEGRITY WITH ASSISTANCE Description STG Maintain Skin Integrity With Assistance. Outcome: Progressing   Problem: RH SAFETY Goal: RH STG ADHERE TO SAFETY PRECAUTIONS W/ASSISTANCE/DEVICE Description STG Adhere to Safety Precautions With min  Assistance/Device.  Outcome: Progressing Goal: RH STG DECREASED RISK OF FALL WITH ASSISTANCE Description STG Decreased Risk of Fall With min Assistance.  Outcome: Progressing   Problem: RH COGNITION-NURSING Goal: RH STG USES MEMORY AIDS/STRATEGIES W/ASSIST TO PROBLEM SOLVE Description STG Uses Memory Aids/Strategies With Assistance to Problem Solve. Outcome: Progressing Goal: RH STG ANTICIPATES NEEDS/CALLS FOR ASSIST W/ASSIST/CUES Description STG Anticipates Needs/Calls for Assist With Assistance/Cues. Outcome: Progressing   Problem: RH PAIN MANAGEMENT Goal: RH STG PAIN MANAGED AT OR BELOW PT'S PAIN GOAL Description Pain scale <4/10  Outcome: Progressing   Problem: RH KNOWLEDGE DEFICIT Goal: RH STG INCREASE KNOWLEDGE OF DIABETES Outcome: Progressing Goal: RH STG INCREASE KNOWLEDGE OF HYPERTENSION Outcome: Progressing Goal: RH STG INCREASE KNOWLEGDE OF HYPERLIPIDEMIA Outcome: Progressing Goal: RH STG INCREASE KNOWLEDGE OF STROKE PROPHYLAXIS Outcome: Progressing   Problem: Consults Goal: Skin Care Protocol Initiated - if Braden Score 18 or less Description If consults are not indicated, leave blank or document N/A Outcome: Progressing Goal: Nutrition  Consult-if indicated Outcome: Progressing Goal: Diabetes Guidelines if Diabetic/Glucose > 140 Description If diabetic or lab glucose is > 140 mg/dl - Initiate Diabetes/Hyperglycemia Guidelines & Document Interventions  Outcome: Progressing

## 2018-05-20 NOTE — Progress Notes (Signed)
Occupational Therapy Session Note  Patient Details  Name: Rachel Perkins MRN: [id number] Date of Birth: [birth year]  Today's Date: 05/20/2018 OT Individual Time: 1300-1400 OT Individual Time Calculation (min): 60 min    Short Term Goals: Week 1:  OT Short Term Goal 1 (Week 1): STG = LTGs due to ELOS  Skilled Therapeutic Interventions/Progress Updates:    Pt sitting on toilet upon arrival with NT present.  Pt finished toileting tasks and transferred to w/c.  After washing hands pt transitioned to gym.  OT intervention with focus on sit<>stand, standing balance, attention to L, LUE functional use, safety awareness, and activity tolerance to increase independence with BADLs.  Pt completed block test: RUE-34 blocks, LUE-23 and 9 hole peg test: R-30.26 secs, L-62.14 secs.  Pt engaged in folding wash cloths and towels.  Pt able to fold wash cloths without difficulty but experienced increased difficulty with towels and required 4 attempts to correctly complete.  PT requires mod verbal cues to attend to her LUE during functional tasks. Pt amb with RW to place towels in dirty linen bag.  Pt returned to room and remained in w/c with all needs within reach and belt alarm activated.   Therapy Documentation Precautions:  Precautions Precautions: Fall Precaution Comments: mild L inattention, mild apraxia, impulsivity  Restrictions Weight Bearing Restrictions: No   Pain:  Pt denies pain   Therapy/Group: Individual Therapy  Leroy Libman 05/20/2018, 2:37 PM

## 2018-05-21 ENCOUNTER — Inpatient Hospital Stay (HOSPITAL_COMMUNITY): Payer: Medicare Other

## 2018-05-21 ENCOUNTER — Inpatient Hospital Stay (HOSPITAL_COMMUNITY): Payer: Medicare Other | Admitting: Occupational Therapy

## 2018-05-21 ENCOUNTER — Inpatient Hospital Stay (HOSPITAL_COMMUNITY): Payer: Medicare Other | Admitting: Physical Therapy

## 2018-05-21 DIAGNOSIS — E162 Hypoglycemia, unspecified: Secondary | ICD-10-CM

## 2018-05-21 LAB — GLUCOSE, CAPILLARY
Glucose-Capillary: 112 mg/dL — ABNORMAL HIGH (ref 70–99)
Glucose-Capillary: 122 mg/dL — ABNORMAL HIGH (ref 70–99)
Glucose-Capillary: 127 mg/dL — ABNORMAL HIGH (ref 70–99)
Glucose-Capillary: 200 mg/dL — ABNORMAL HIGH (ref 70–99)

## 2018-05-21 LAB — CBC
HCT: 41.5 % (ref 36.0–46.0)
Hemoglobin: 14.2 g/dL (ref 12.0–15.0)
MCH: 32.3 pg (ref 26.0–34.0)
MCHC: 34.2 g/dL (ref 30.0–36.0)
MCV: 94.3 fL (ref 80.0–100.0)
Platelets: 222 10*3/uL (ref 150–400)
RBC: 4.4 MIL/uL (ref 3.87–5.11)
RDW: 13.5 % (ref 11.5–15.5)
WBC: 7.3 10*3/uL (ref 4.0–10.5)
nRBC: 0 % (ref 0.0–0.2)

## 2018-05-21 LAB — PROTIME-INR
INR: 2.22
Prothrombin Time: 24.3 seconds — ABNORMAL HIGH (ref 11.4–15.2)

## 2018-05-21 MED ORDER — WARFARIN SODIUM 2.5 MG PO TABS
2.5000 mg | ORAL_TABLET | Freq: Once | ORAL | Status: AC
  Administered 2018-05-21: 2.5 mg via ORAL
  Filled 2018-05-21: qty 1

## 2018-05-21 NOTE — Plan of Care (Addendum)
LTGs downgraded to supervision overall 2/2 cognitive deficits. Anticipate pt will require 24/7 supervision at d/c 2/2 L inattention, apraxia, and impaired problem solving affecting safe functional mobility.   Namiko Pritts K. Clemetine Marker, PT, DPT

## 2018-05-21 NOTE — Progress Notes (Signed)
ANTICOAGULATION CONSULT NOTE - Initial Consult  Pharmacy Consult for Lovenox + Warfarin Indication: stroke & concern for R-mobile thrombus  Allergies  Allergen Reactions  . Codeine Itching  . Penicillins Rash    DID THE REACTION INVOLVE: Swelling of the face/tongue/throat, SOB, or low BP? Unknown Sudden or severe rash/hives, skin peeling, or the inside of the mouth or nose? Unknown Did it require medical treatment? Unknown When did it last happen?unk If all above answers are "NO", may proceed with cephalosporin use.   . Sulfa Antibiotics Rash  . Lisinopril Cough  . Other Other (See Comments)    NUTS ?LOBSTER unknown    Patient Measurements: Height: 5\' 3"  (160 cm) Weight: 135 lb 12.9 oz (61.6 kg) IBW/kg (Calculated) : 52.4 Ht: 5'4" Wt: 64.8 kg  Vital Signs: Temp: 99.7 F (37.6 C) (01/11 0505) Temp Source: Oral (01/11 0505) BP: 124/75 (01/11 0505) Pulse Rate: 81 (01/11 0505)  Labs: Recent Labs    05/19/18 0601 05/20/18 0721 05/21/18 0530  HGB 14.8  --  14.2  HCT 43.9  --  41.5  PLT 210  --  222  LABPROT 19.2* 23.6* 24.3*  INR 1.64 2.14 2.22  CREATININE 0.90  --   --     Estimated Creatinine Clearance: 48.8 mL/min (by C-G formula based on SCr of 0.9 mg/dL).   Medical History: Past Medical History:  Diagnosis Date  . Anginal pain ([place])   . Arthritis   . CAD (coronary artery disease)    a. 09/25/13 s/p DES to [facility] and DES to pRCA.  Marland Kitchen Chronic left-sided back pain    has had ESI/Dr. Francesco Runner  . DM (diabetes mellitus) ([place])    a. diet controlled.   Marland Kitchen HTN (hypertension)   . Myocardial infarction [facility]) 09/2013   nstemi  . Tobacco abuse     Assessment: 58 YOF transferred to CIR from [place] on 1/8 for rehab s/p recent CVA on 1/1. The patient was also found to have a possible R-mobile thrombus and had been receiving Lovenox bridge with therapeutic INR at [place] consulted to resume dosing this admission.  INR remains therapeutic at 2.22, CBC  wnl and stable.  Goal of Therapy:  INR 2-3 Anti-Xa level 0.6-1 units/ml 4hrs after LMWH dose given Monitor platelets by anticoagulation protocol: Yes   Plan:  D/c lovenox Give warfarin 2.5mg  PO x 1 tonight Daily INR, s/s bleeding  Bertis Ruddy, PharmD Clinical Pharmacist Please check AMION for all [place] numbers 05/21/2018 8:12 AM

## 2018-05-21 NOTE — Plan of Care (Signed)
  Problem: RH BOWEL ELIMINATION Goal: RH STG MANAGE BOWEL WITH ASSISTANCE Description STG Manage Bowel with min Assistance.  Outcome: Progressing Goal: RH STG MANAGE BOWEL W/MEDICATION W/ASSISTANCE Description STG Manage Bowel with Medication with min Assistance.  Outcome: Progressing   Problem: RH SKIN INTEGRITY Goal: RH STG SKIN FREE OF INFECTION/BREAKDOWN Description Will have no acquired skin break down in 14 days  Outcome: Progressing Goal: RH STG MAINTAIN SKIN INTEGRITY WITH ASSISTANCE Description STG Maintain Skin Integrity With min Assistance.  Outcome: Progressing   Problem: RH SAFETY Goal: RH STG ADHERE TO SAFETY PRECAUTIONS W/ASSISTANCE/DEVICE Description STG Adhere to Safety Precautions With min  Assistance/Device.  Outcome: Progressing Goal: RH STG DECREASED RISK OF FALL WITH ASSISTANCE Description STG Decreased Risk of Fall With min Assistance.  Outcome: Progressing   Problem: RH COGNITION-NURSING Goal: RH STG USES MEMORY AIDS/STRATEGIES W/ASSIST TO PROBLEM SOLVE Description STG Uses Memory Aids/Strategies With mod I Assistance to Problem Solve.  Outcome: Progressing Goal: RH STG ANTICIPATES NEEDS/CALLS FOR ASSIST W/ASSIST/CUES Description STG Anticipates Needs/Calls for Assist With mod I Assistance/Cues.  Outcome: Progressing   Problem: RH PAIN MANAGEMENT Goal: RH STG PAIN MANAGED AT OR BELOW PT'S PAIN GOAL Description Pain scale <4/10  Outcome: Progressing   Problem: RH KNOWLEDGE DEFICIT Goal: RH STG INCREASE KNOWLEDGE OF DIABETES Outcome: Progressing Goal: RH STG INCREASE KNOWLEDGE OF HYPERTENSION Outcome: Progressing Goal: RH STG INCREASE KNOWLEGDE OF HYPERLIPIDEMIA Outcome: Progressing Goal: RH STG INCREASE KNOWLEDGE OF STROKE PROPHYLAXIS Outcome: Progressing   Problem: Consults Goal: Skin Care Protocol Initiated - if Braden Score 18 or less Description If consults are not indicated, leave blank or document N/A Outcome: Progressing Goal:  Nutrition Consult-if indicated Outcome: Progressing Goal: Diabetes Guidelines if Diabetic/Glucose > 140 Description If diabetic or lab glucose is > 140 mg/dl - Initiate Diabetes/Hyperglycemia Guidelines & Document Interventions  Outcome: Progressing

## 2018-05-21 NOTE — Progress Notes (Signed)
[place] PHYSICAL MEDICINE & REHABILITATION PROGRESS NOTE   Subjective/Complaints: Patient seen laying in bed this morning.  She states she slept well overnight per patient, she slept fairly overnight per sleep chart.  She notes she is doing well with therapies.  ROS-denies CP, SOB, N/V/D  Objective:   No results found. Recent Labs    05/19/18 0601 05/21/18 0530  WBC 8.6 7.3  HGB 14.8 14.2  HCT 43.9 41.5  PLT 210 222   Recent Labs    05/19/18 0601  NA 137  K 4.2  CL 105  CO2 23  GLUCOSE 111*  BUN 15  CREATININE 0.90  CALCIUM 9.4    Intake/Output Summary (Last 24 hours) at 05/21/2018 1140 Last data filed at 05/21/2018 1033 Gross per 24 hour  Intake 920 ml  Output -  Net 920 ml     Physical Exam: Vital Signs Blood pressure 124/75, pulse 81, temperature 99.7 F (37.6 C), temperature source Oral, resp. rate 14, height 5\' 3"  (1.6 m), weight 61.6 kg, SpO2 98 %.   Constitutional: No distress . Vital signs reviewed. HENT: Normocephalic.  Atraumatic. Eyes: EOMI. No discharge. Cardiovascular: RRR. No JVD. Respiratory: CTA Bilaterally. Normal effort. GI: BS +. Non-distended. Musc: No edema or tenderness in extremities. Neurologic: Alert Motor: Grossly 5/5 throughout with LUE ataxia Psych: Normal mood.  Normal affect.  Assessment/Plan: 1. Functional deficits secondary to RIght MCA infarct which require 3+ hours per day of interdisciplinary therapy in a comprehensive inpatient rehab setting.  Physiatrist is providing close team supervision and 24 hour management of active medical problems listed below.  Physiatrist and rehab team continue to assess barriers to discharge/monitor patient progress toward functional and medical goals  Care Tool:  Bathing    Body parts bathed by patient: Right arm, Left arm, Chest, Abdomen, Front perineal area, Buttocks, Right upper leg, Left upper leg, Right lower leg, Left lower leg, Face         Bathing assist Assist  Level: Minimal Assistance - Patient > 75%     Upper Body Dressing/Undressing Upper body dressing   What is the patient wearing?: Pull over shirt    Upper body assist Assist Level: Minimal Assistance - Patient > 75%    Lower Body Dressing/Undressing Lower body dressing      What is the patient wearing?: Underwear/pull up, Pants     Lower body assist Assist for lower body dressing: Minimal Assistance - Patient > 75%     Toileting Toileting    Toileting assist Assist for toileting: Minimal Assistance - Patient > 75%     Transfers Chair/bed transfer  Transfers assist     Chair/bed transfer assist level: Contact Guard/Touching assist     Locomotion Ambulation   Ambulation assist      Assist level: Contact Guard/Touching assist Assistive device: Walker-rolling Max distance: 192ft   Walk 10 feet activity   Assist     Assist level: Contact Guard/Touching assist Assistive device: Walker-rolling   Walk 50 feet activity   Assist    Assist level: Contact Guard/Touching assist Assistive device: Walker-rolling    Walk 150 feet activity   Assist    Assist level: Contact Guard/Touching assist Assistive device: Walker-rolling    Walk 10 feet on uneven surface  activity   Assist     Assist level: Contact Guard/Touching assist Assistive device: Other (comment)(none )   Wheelchair     Assist Will patient use wheelchair at discharge?: No   Wheelchair activity did not occur:  N/A         Wheelchair 50 feet with 2 turns activity    Assist    Wheelchair 50 feet with 2 turns activity did not occur: N/A       Wheelchair 150 feet activity     Assist Wheelchair 150 feet activity did not occur: N/A        Medical Problem List and Plan: 1.  Limitations with mobility, safety, self-care, cognitive deficits secondary to right MCA infarct.  Continue CIR 2.  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 Chronic back pain/Pain Management: Off gabapentin and oxycodone due to myoclonus.  4. Mood: LCSW to follow for evaluation and support.  5. Neuropsych: This patient is capable of making decisions on her own behalf. 6. Skin/Wound Care: Routine pressure relief measures.  7. Fluids/Electrolytes/Nutrition: Monitor I/O.   8. HTN: Monitor BP bid--continue metoprolol bid. Vitals:   05/20/18 1927 05/21/18 0505  BP: 129/84 124/75  Pulse: 91 81  Resp: 16 14  Temp: 97.8 F (36.6 C) 99.7 F (37.6 C)  SpO2: 98% 98%   Controlled 1/11 9. T2DM: Hgb A1c-7.0. Continue metformin daily. Monitor BS ac/hs.  CBG (last 3)  Recent Labs    05/20/18 1729 05/20/18 2209 05/21/18 0643  GLUCAP 109* 100* 112*   Labile with hypoglycemia on 1/11, monitor for trend 10.  Sleep disturbance: Continue melatonin at nights. sleep wake chart 11. Delirium: Continue Seroquel bid for now.   12.  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

## 2018-05-21 NOTE — Progress Notes (Signed)
Occupational Therapy Session Note  Patient Details  Name: Rachel Perkins MRN: [id number] Date of Birth: [birth year]  Today's Date: 05/21/2018 OT Individual Time: 1415-1535 OT Individual Time Calculation (min): 80 min    Short Term Goals: Week 1:  OT Short Term Goal 1 (Week 1): STG = LTGs due to ELOS  Skilled Therapeutic Interventions/Progress Updates:    Patient in bed upon arrival with husband and sister present.  She denies pain and states that she is ready for therapy.   Toileting:  CS,    Donning footwear with CG/CS, min A to tie shoes Functional transfers:  CS/CG to/from bed, toilet, arm chair, w/c, mat table, bed mobility with CS Ambulation from room to therapy gyms and back with CG/CS no AD, no LOB, occ cues for direction.  Completed mobility task in gym setting with multiple turns requiring increased time to maintain balance.  Left UE dexterity activities with min difficulty when attention was focused.  Carrying objects with Left hand with CS, bean bag toss with good accuracy using left UE, able to retrieve bean bags from the floor with CG. Completed visual scanning task - increased time, cues for thoroughness (90% accuracy after scanning task twice) - attention to task was fair as she was easily distracted, she skipped lines and repeated other lines - reviewed strategies with patient and family  Provided exercises to complete when in room for left hand function with good understanding demonstrated.  Patient returned to room, seated in w/c with seat sensor alarm set.  Family present and aware of patient safety needs.  Small skin tear noted left UE at close of session - nursing alerted and will attend to.  Therapy Documentation Precautions:  Precautions Precautions: Fall Precaution Comments: mild L inattention, mild apraxia, impulsivity  Restrictions Weight Bearing Restrictions: No General:   Vital Signs:   Pain: Pain Assessment Pain Scale: 0-10 Pain Score: 0-No  pain   Therapy/Group: Individual Therapy  Carlos Levering 05/21/2018, 3:41 PM

## 2018-05-21 NOTE — Progress Notes (Signed)
Speech Language Pathology Daily Session Note  Patient Details  Name: Rachel Perkins MRN: [id number] Date of Birth: Oct 01, 1950  Today's Date: 05/21/2018 SLP Individual Time: 9381-8299 SLP Individual Time Calculation (min): 30 min  Short Term Goals: Week 1: SLP Short Term Goal 1 (Week 1): STGs=LTGs due to short length of stay   Skilled Therapeutic Interventions:Skilled ST services focused on cognitive skills. SLP facilitated semi-complex problem solving and left scanning skills utilizing semi-complex calendar task, pt required Max A fading to mod A verbal cues for problem solving and min A verbal cues to scan left of midline, likely due to impairment in organization and working memory. Pt was left in room with call bell within reach and bed alarm set. SLP reccomends to continue skilled services.      Pain Pain Assessment Pain Score: 0-No pain  Therapy/Group: Individual Therapy  Malka Bocek  [facility] 05/21/2018, 12:28 PM

## 2018-05-21 NOTE — Progress Notes (Signed)
Physical Therapy Session Note  Patient Details  Name: Rachel Perkins MRN: [id number] Date of Birth: March 11, 1951  Today's Date: 05/21/2018 PT Individual Time: 0800-0915 PT Individual Time Calculation (min): 75 min   Short Term Goals: Week 1:  PT Short Term Goal 1 (Week 1): STG = LTG due to ELOS   Skilled Therapeutic Interventions/Progress Updates:   Pt sitting EOB eating breakfast, agreeable to therapy and no c/o pain. Donned shoes w/ set-up assist. Ambulated around unit w/o AD, CGA overall, >150' at a time. Occasional verbal cues for L obstacle avoidance, however improved visual scanning during functional mobility this session. Worked on single leg stance and balance strategies while in gym. Performed anterior and lateral stepping to visual targets, alternating LEs. Min assist for balance and tactile cues for weight shifting strategies. Ambulated over ladder on ground to facilitate increased step length and foot clearance bilaterally as pt tends to shuffle during gait. Biodex limits of stability training performed on beginner level. Pt able to perform only w/ max verbal, visual, and tactile cues 2/2 apraxia. However, did perform weight shifting in all directions. Dynavision w/ alternating use of UEs to work on L body awareness and L attention. Stood 10+ minutes before needing seated rest break. NuStep 5 min @ level 4 x2 for endurance training and global strengthening. Returned to room and ended session in w/c, all needs in reach.   Therapy Documentation Precautions:  Precautions Precautions: Fall Precaution Comments: mild L inattention, mild apraxia, impulsivity  Restrictions Weight Bearing Restrictions: No  Therapy/Group: Individual Therapy  Quintel Mccalla K Janey Petron 05/21/2018, 12:00 PM

## 2018-05-22 ENCOUNTER — Inpatient Hospital Stay (HOSPITAL_COMMUNITY): Payer: Medicare Other

## 2018-05-22 DIAGNOSIS — R791 Abnormal coagulation profile: Secondary | ICD-10-CM

## 2018-05-22 DIAGNOSIS — R7309 Other abnormal glucose: Secondary | ICD-10-CM

## 2018-05-22 LAB — PROTIME-INR
INR: 1.76
Prothrombin Time: 20.3 seconds — ABNORMAL HIGH (ref 11.4–15.2)

## 2018-05-22 LAB — GLUCOSE, CAPILLARY
Glucose-Capillary: 121 mg/dL — ABNORMAL HIGH (ref 70–99)
Glucose-Capillary: 160 mg/dL — ABNORMAL HIGH (ref 70–99)
Glucose-Capillary: 115 mg/dL — ABNORMAL HIGH (ref 70–99)
Glucose-Capillary: 131 mg/dL — ABNORMAL HIGH (ref 70–99)

## 2018-05-22 MED ORDER — WARFARIN SODIUM 5 MG PO TABS
5.0000 mg | ORAL_TABLET | Freq: Once | ORAL | Status: AC
  Administered 2018-05-22: 5 mg via ORAL
  Filled 2018-05-22: qty 1

## 2018-05-22 NOTE — Progress Notes (Signed)
ANTICOAGULATION CONSULT NOTE - Initial Consult  Pharmacy Consult for Warfarin Indication: stroke & concern for R-mobile thrombus  Allergies  Allergen Reactions  . Codeine Itching  . Penicillins Rash    DID THE REACTION INVOLVE: Swelling of the face/tongue/throat, SOB, or low BP? Unknown Sudden or severe rash/hives, skin peeling, or the inside of the mouth or nose? Unknown Did it require medical treatment? Unknown When did it last happen?unk If all above answers are "NO", may proceed with cephalosporin use.   . Sulfa Antibiotics Rash  . Lisinopril Cough  . Other Other (See Comments)    NUTS ?LOBSTER unknown    Patient Measurements: Height: 5\' 3"  (160 cm) Weight: 135 lb 12.9 oz (61.6 kg) IBW/kg (Calculated) : 52.4 Ht: 5'4" Wt: 64.8 kg  Vital Signs: Temp: 97.8 F (36.6 C) (01/12 0358) Temp Source: Oral (01/12 0358) BP: 135/70 (01/12 0358) Pulse Rate: 79 (01/12 0358)  Labs: Recent Labs    05/20/18 0721 05/21/18 0530 05/22/18 0637  HGB  --  14.2  --   HCT  --  41.5  --   PLT  --  222  --   LABPROT 23.6* 24.3* 20.3*  INR 2.14 2.22 1.76    Estimated Creatinine Clearance: 48.8 mL/min (by C-G formula based on SCr of 0.9 mg/dL).   Medical History: Past Medical History:  Diagnosis Date  . Anginal pain ([place])   . Arthritis   . CAD (coronary artery disease)    a. 09/25/13 s/p DES to [facility] and DES to pRCA.  Marland Kitchen Chronic left-sided back pain    has had ESI/Dr. Francesco Runner  . DM (diabetes mellitus) ([place])    a. diet controlled.   Marland Kitchen HTN (hypertension)   . Myocardial infarction [facility]) 09/2013   nstemi  . Tobacco abuse     Assessment: 69 YOF transferred to CIR from [place] on 1/8 for rehab s/p recent CVA on 1/1. The patient was also found to have a possible R-mobile thrombus and had been receiving Lovenox bridge with therapeutic INR at [facility] consulted to resume dosing this admission.  INR subtherapeutic at 1.76 after two consecutive therapeutic levels, PO  intake variable.  Goal of Therapy:  INR 2-3 Monitor platelets by anticoagulation protocol: Yes   Plan:  Warfarin 5mg  PO x 1 tonight Daily INR, s/s bleeding  Bertis Ruddy, PharmD Clinical Pharmacist Please check AMION for all [place] numbers 05/22/2018 8:05 AM

## 2018-05-22 NOTE — Progress Notes (Signed)
[place] PHYSICAL MEDICINE & REHABILITATION PROGRESS NOTE   Subjective/Complaints: Patient seen lying in bed this morning.  She states she slept well overnight.  She is looking forward to her day of relative rest today.  ROS: Denies CP, SOB, N/V/D  Objective:   No results found. Recent Labs    05/21/18 0530  WBC 7.3  HGB 14.2  HCT 41.5  PLT 222   No results for input(s): NA, K, CL, CO2, GLUCOSE, BUN, CREATININE, CALCIUM in the last 72 hours.  Intake/Output Summary (Last 24 hours) at 05/22/2018 8546 Last data filed at 05/21/2018 2300 Gross per 24 hour  Intake 960 ml  Output -  Net 960 ml     Physical Exam: Vital Signs Blood pressure 135/70, pulse 79, temperature 97.8 F (36.6 C), temperature source Oral, resp. rate 13, height 5\' 3"  (1.6 m), weight 61.6 kg, SpO2 99 %.   Constitutional: No distress . Vital signs reviewed. HENT: Normocephalic.  Atraumatic. Eyes: EOMI. No discharge. Cardiovascular: RRR.  No JVD. Respiratory: CTA bilaterally.  Normal effort. GI: BS +. Non-distended. Musc: No edema or tenderness in extremities. Neurologic: Alert Motor: Grossly 5/5 throughout with LUE ataxia, stable Psych: Normal mood.  Normal affect.  Assessment/Plan: 1. Functional deficits secondary to RIght MCA infarct which require 3+ hours per day of interdisciplinary therapy in a comprehensive inpatient rehab setting.  Physiatrist is providing close team supervision and 24 hour management of active medical problems listed below.  Physiatrist and rehab team continue to assess barriers to discharge/monitor patient progress toward functional and medical goals  Care Tool:  Bathing    Body parts bathed by patient: Right arm, Left arm, Chest, Abdomen, Front perineal area, Buttocks, Right upper leg, Left upper leg, Right lower leg, Left lower leg, Face         Bathing assist Assist Level: Minimal Assistance - Patient > 75%     Upper Body Dressing/Undressing Upper body  dressing   What is the patient wearing?: Pull over shirt    Upper body assist Assist Level: Minimal Assistance - Patient > 75%    Lower Body Dressing/Undressing Lower body dressing      What is the patient wearing?: Underwear/pull up, Pants     Lower body assist Assist for lower body dressing: Minimal Assistance - Patient > 75%     Toileting Toileting    Toileting assist Assist for toileting: Supervision/Verbal cueing     Transfers Chair/bed transfer  Transfers assist     Chair/bed transfer assist level: Contact Guard/Touching assist     Locomotion Ambulation   Ambulation assist      Assist level: Contact Guard/Touching assist Assistive device: Other (comment)(none) Max distance: >150'   Walk 10 feet activity   Assist     Assist level: Contact Guard/Touching assist Assistive device: Other (comment)(none)   Walk 50 feet activity   Assist    Assist level: Contact Guard/Touching assist Assistive device: Other (comment)(none)    Walk 150 feet activity   Assist    Assist level: Contact Guard/Touching assist Assistive device: Other (comment)(none)    Walk 10 feet on uneven surface  activity   Assist     Assist level: Contact Guard/Touching assist Assistive device: Other (comment)(none )   Wheelchair     Assist Will patient use wheelchair at discharge?: No   Wheelchair activity did not occur: N/A         Wheelchair 50 feet with 2 turns activity    Assist    Wheelchair  50 feet with 2 turns activity did not occur: N/A       Wheelchair 150 feet activity     Assist Wheelchair 150 feet activity did not occur: N/A        Medical Problem List and Plan: 1.  Limitations with mobility, safety, self-care, cognitive deficits secondary to right MCA infarct.  Continue CIR 2.  Fibroelastoma aortic valve/DVT Prophylaxis/Anticoagulation: Pharmaceutical: Coumadin  INR subtherapeutic on 1/12 after being therapeutic.   Appreciate pharmacy Recs 3. Chronic back pain/Pain Management: Off gabapentin and oxycodone due to myoclonus.  4. Mood: LCSW to follow for evaluation and support.  5. Neuropsych: This patient is capable of making decisions on her own behalf. 6. Skin/Wound Care: Routine pressure relief measures.  7. Fluids/Electrolytes/Nutrition: Monitor I/O.   8. HTN: Monitor BP bid--continue metoprolol bid. Vitals:   05/21/18 1941 05/22/18 0358  BP: 119/77 135/70  Pulse: 95 79  Resp: 16 13  Temp: 98.1 F (36.7 C) 97.8 F (36.6 C)  SpO2: 98% 99%   Controlled 1/12 9. T2DM: Hgb A1c-7.0. Continue metformin daily. Monitor BS ac/hs.  CBG (last 3)  Recent Labs    05/21/18 1648 05/21/18 2114 05/22/18 0641  GLUCAP 127* 200* 121*   Labile on 1/12, monitor for trend 10.  Sleep disturbance: Continue melatonin at nights. sleep wake chart 11. Delirium: Continue Seroquel bid for now.   12.  Fibroelastoma aortic valve- outpt CVTS f/u for removal  LOS: 4 days A FACE TO FACE EVALUATION WAS PERFORMED  Rachel Perkins Rachel Perkins 05/22/2018, 8:22 AM

## 2018-05-22 NOTE — Plan of Care (Signed)
  Problem: RH BOWEL ELIMINATION Goal: RH STG MANAGE BOWEL WITH ASSISTANCE Description STG Manage Bowel with min Assistance.  Outcome: Progressing Goal: RH STG MANAGE BOWEL W/MEDICATION W/ASSISTANCE Description STG Manage Bowel with Medication with min Assistance.  Outcome: Progressing   Problem: RH SKIN INTEGRITY Goal: RH STG SKIN FREE OF INFECTION/BREAKDOWN Description Will have no acquired skin break down in 14 days  Outcome: Progressing Goal: RH STG MAINTAIN SKIN INTEGRITY WITH ASSISTANCE Description STG Maintain Skin Integrity With min Assistance.  Outcome: Progressing   Problem: RH SAFETY Goal: RH STG ADHERE TO SAFETY PRECAUTIONS W/ASSISTANCE/DEVICE Description STG Adhere to Safety Precautions With min  Assistance/Device.  Outcome: Progressing Goal: RH STG DECREASED RISK OF FALL WITH ASSISTANCE Description STG Decreased Risk of Fall With min Assistance.  Outcome: Progressing   Problem: RH COGNITION-NURSING Goal: RH STG USES MEMORY AIDS/STRATEGIES W/ASSIST TO PROBLEM SOLVE Description STG Uses Memory Aids/Strategies With mod I Assistance to Problem Solve.  Outcome: Progressing Goal: RH STG ANTICIPATES NEEDS/CALLS FOR ASSIST W/ASSIST/CUES Description STG Anticipates Needs/Calls for Assist With mod I Assistance/Cues.  Outcome: Progressing   Problem: RH PAIN MANAGEMENT Goal: RH STG PAIN MANAGED AT OR BELOW PT'S PAIN GOAL Description Pain scale <4/10  Outcome: Progressing   Problem: RH KNOWLEDGE DEFICIT Goal: RH STG INCREASE KNOWLEDGE OF DIABETES Outcome: Progressing Goal: RH STG INCREASE KNOWLEDGE OF HYPERTENSION Outcome: Progressing Goal: RH STG INCREASE KNOWLEGDE OF HYPERLIPIDEMIA Outcome: Progressing Goal: RH STG INCREASE KNOWLEDGE OF STROKE PROPHYLAXIS Outcome: Progressing   Problem: Consults Goal: Skin Care Protocol Initiated - if Braden Score 18 or less Description If consults are not indicated, leave blank or document N/A Outcome: Progressing Goal:  Nutrition Consult-if indicated Outcome: Progressing Goal: Diabetes Guidelines if Diabetic/Glucose > 140 Description If diabetic or lab glucose is > 140 mg/dl - Initiate Diabetes/Hyperglycemia Guidelines & Document Interventions  Outcome: Progressing

## 2018-05-22 NOTE — Progress Notes (Signed)
Restless, scheduled seroquel and melatonin given. At [place], PRN trazodone 25mg 's given and effective. Rachel Perkins A

## 2018-05-22 NOTE — Progress Notes (Signed)
Physical Therapy Session Note  Patient Details  Name: Rachel Perkins MRN: [id number] Date of Birth: 1951-01-05  Today's Date: 05/22/2018 PT Individual Time: 1415-1515 PT Individual Time Calculation (min): 60 min   Short Term Goals: Week 1:  PT Short Term Goal 1 (Week 1): STG = LTG due to ELOS   Skilled Therapeutic Interventions/Progress Updates:    Pt supine in bed upon PT arrival, agreeable to therapy tx and denies pain. Pt transferred to sitting with supervision and donned shoes. Pt ambulated to sink with CGA and washed hands, washed face, rinsed mouth, all with supervision. Pt ambulated to the dayroom with CGA x 150 ft and transferred onto nustep, used nustep x 5 minutes on workload 5 for global strengthening and endurance. Pt ambulated to the gym with CGA x 150 ft. Pt worked on L UE coordination this session to complete bead threading task and then clothespin task. Pt ambulated from gym>atrium x 400 ft with CGA, and then ambulated outside on unlevel surfaces 2 x 100 ft, pt ambulated across grass with CGA and ascended/descended curbs with min assist. Pt ambulated back to unit and back to her room 500+ ft with CGA and left in w/c with needs in reach, chair alarm set.   Therapy Documentation Precautions:  Precautions Precautions: Fall Precaution Comments: mild L inattention, mild apraxia, impulsivity  Restrictions Weight Bearing Restrictions: No   Therapy/Group: Individual Therapy  Netta Corrigan, PT, DPT 05/22/2018, 12:15 PM

## 2018-05-23 ENCOUNTER — Inpatient Hospital Stay (HOSPITAL_COMMUNITY): Payer: Medicare Other | Admitting: Speech Pathology

## 2018-05-23 ENCOUNTER — Inpatient Hospital Stay (HOSPITAL_COMMUNITY): Payer: Medicare Other

## 2018-05-23 LAB — BASIC METABOLIC PANEL
Anion gap: 8 (ref 5–15)
BUN: 24 mg/dL — ABNORMAL HIGH (ref 8–23)
CO2: 24 mmol/L (ref 22–32)
Calcium: 8.9 mg/dL (ref 8.9–10.3)
Chloride: 105 mmol/L (ref 98–111)
Creatinine, Ser: 0.93 mg/dL (ref 0.44–1.00)
GFR calc Af Amer: >60 mL/min (ref >60)
GFR calc non Af Amer: >60 mL/min (ref >60)
Glucose, Bld: 118 mg/dL — ABNORMAL HIGH (ref 70–99)
Potassium: 3.6 mmol/L (ref 3.5–5.1)
Sodium: 137 mmol/L (ref 135–145)

## 2018-05-23 LAB — GLUCOSE, CAPILLARY
Glucose-Capillary: 115 mg/dL — ABNORMAL HIGH (ref 70–99)
Glucose-Capillary: 105 mg/dL — ABNORMAL HIGH (ref 70–99)
Glucose-Capillary: 118 mg/dL — ABNORMAL HIGH (ref 70–99)
Glucose-Capillary: 118 mg/dL — ABNORMAL HIGH (ref 70–99)

## 2018-05-23 LAB — PROTIME-INR
INR: 1.5
Prothrombin Time: 18 seconds — ABNORMAL HIGH (ref 11.4–15.2)

## 2018-05-23 MED ORDER — WARFARIN SODIUM 5 MG PO TABS
5.0000 mg | ORAL_TABLET | Freq: Once | ORAL | Status: AC
  Administered 2018-05-23: 5 mg via ORAL
  Filled 2018-05-23: qty 1

## 2018-05-23 MED ORDER — ENOXAPARIN SODIUM 60 MG/0.6ML ~~LOC~~ SOLN
1.0000 mg/kg | Freq: Two times a day (BID) | SUBCUTANEOUS | Status: DC
  Administered 2018-05-23 – 2018-05-27 (×9): 60 mg via SUBCUTANEOUS
  Filled 2018-05-23 (×9): qty 0.6

## 2018-05-23 NOTE — Progress Notes (Signed)
Speech Language Pathology Daily Session Note  Patient Details  Name: Rachel Perkins MRN: [id number] Date of Birth: Dec 07, 1950  Today's Date: 05/23/2018 SLP Individual Time: 5248-1859 SLP Individual Time Calculation (min): 45 min  Short Term Goals: Week 1: SLP Short Term Goal 1 (Week 1): STGs=LTGs due to short length of stay   Skilled Therapeutic Interventions: Skilled treatment session focused on cognitive goals. SLP facilitated session by providing overall Mod A verbal cues for problem solving and organization during a calendar making task. Patient demonstrated emergent awareness in regards to difficulty of task and reported she felt difficulty was due to decreased sustained attention to task. However, patient able to redirect herself with extra time throughout session. SLP also noted increased coordination of her RUE during functional tasks. Patient left upright in wheelchair with alarm on and all needs within reach. Continue with current plan of care.      Pain Pain Assessment Pain Scale: 0-10 Pain Score: 0-No pain  Therapy/Group: Individual Therapy  Astou Lada 05/23/2018, 12:27 PM

## 2018-05-23 NOTE — Progress Notes (Signed)
Slept good without needing PRN trazodone. Continent of bowel & bladder. LBM 01/10. Rachel Perkins

## 2018-05-23 NOTE — Progress Notes (Signed)
Occupational Therapy Session Note  Patient Details  Name: Rachel Perkins MRN: [id number] Date of Birth: Nov 18, 1950  Today's Date: 05/23/2018 OT Individual Time: 0930-1100 OT Individual Time Calculation (min): 90 min    Short Term Goals: Week 1:  OT Short Term Goal 1 (Week 1): STG = LTGs due to ELOS  Skilled Therapeutic Interventions/Progress Updates:    OT intervention with focus on BADL retraining including bathing at shower level and dressing with sit<>stand, attention to L, forced use of LUE, practicing tub bench transfers, safety awareness, and activity tolerance to increase independence with BADLs. See below for assist levels.  Pt required assistance fastening bra.  Recommended sports bra. Pt with mild L inattention and absent LUE sensation and proprioception.  Pt with skin tear on L hand when entering bathroom.  Pt bumped into door jam.  RN notified and attended to pt. Pt amb without AD throughout session.  Pt amb to ADL apartment and practiced stepping over into tub with use of grab bars-supervision level.  Pt also engaged in activity with focus on attending to L; locating and retrieving numbered discs placed in hallway.  Pt required mod verbal cues to locate discs.  Pt returned to room and remained in w/c with belt alarm activated and all needs within reach.   Therapy Documentation Precautions:  Precautions Precautions: Fall Precaution Comments: mild L inattention, mild apraxia, impulsivity  Restrictions Weight Bearing Restrictions: No  Pain: Pt denies pain ADL: ADL Grooming: Minimal assistance Where Assessed-Grooming: Standing at sink Upper Body Bathing: supervision Where Assessed-Upper Body Bathing: Shower Lower Body Bathing:supervision Where Assessed-Lower Body Bathing: Shower Upper Body Dressing: Minimal assistance, Minimal cueing Where Assessed-Upper Body Dressing:Standing Lower Body Dressing: Minimal assistance, Minimal cueing Gaffer Transfer: CGA Press photographer Method: Heritage manager: Grab bars, Shower seat with back   Therapy/Group: Individual Therapy  Leroy Libman 05/23/2018, 12:32 PM

## 2018-05-23 NOTE — Progress Notes (Signed)
ANTICOAGULATION CONSULT NOTE - Initial Consult  Pharmacy Consult for Warfarin Indication: stroke & concern for R-mobile thrombus  Allergies  Allergen Reactions  . Codeine Itching  . Penicillins Rash    DID THE REACTION INVOLVE: Swelling of the face/tongue/throat, SOB, or low BP? Unknown Sudden or severe rash/hives, skin peeling, or the inside of the mouth or nose? Unknown Did it require medical treatment? Unknown When did it last happen?unk If all above answers are "NO", may proceed with cephalosporin use.   . Sulfa Antibiotics Rash  . Lisinopril Cough  . Other Other (See Comments)    NUTS ?LOBSTER unknown    Patient Measurements: Height: 5\' 3"  (160 cm) Weight: 135 lb 12.9 oz (61.6 kg) IBW/kg (Calculated) : 52.4 Ht: 5'4" Wt: 64.8 kg  Vital Signs: Temp: 97.6 F (36.4 C) (01/13 0421) Temp Source: Oral (01/13 0421) BP: 152/87 (01/13 0421) Pulse Rate: 66 (01/13 0421)  Labs: Recent Labs    05/21/18 0530 05/22/18 0637 05/23/18 0659  HGB 14.2  --   --   HCT 41.5  --   --   PLT 222  --   --   LABPROT 24.3* 20.3* 18.0*  INR 2.22 1.76 1.50  CREATININE  --   --  0.93    Estimated Creatinine Clearance: 47.2 mL/min (by C-G formula based on SCr of 0.93 mg/dL).   Medical History: Past Medical History:  Diagnosis Date  . Anginal pain ([place])   . Arthritis   . CAD (coronary artery disease)    a. 09/25/13 s/p DES to [facility] and DES to pRCA.  Marland Kitchen Chronic left-sided back pain    has had ESI/Dr. Francesco Runner  . DM (diabetes mellitus) ([place])    a. diet controlled.   Marland Kitchen HTN (hypertension)   . Myocardial infarction [facility]) 09/2013   nstemi  . Tobacco abuse     Assessment: 44 YOF transferred to CIR from [place] on 1/8 for rehab s/p recent CVA on 1/1. Warf for new CVA, R-heart mobile lesion/thrombus. Was started at [facility] before transferring to Rehab. INR remains subtherapeutic and down to 1.5 today. CBC stable. Plan to cover low INR with Lovenox until therapeutic  Goal of Therapy:   INR 2-3 Monitor platelets by anticoagulation protocol: Yes   Plan:  Give Coumadin 5mg  PO x 1 Restart enoxaparin 60mg  [place] Q12h until INR therapeutic Monitor daily INR, CBC, s/s of bleed  Elenor Quinones, PharmD, BCPS, [facility] Clinical Pharmacist Phone number [phone] 05/23/2018 8:45 AM

## 2018-05-23 NOTE — Progress Notes (Signed)
Physical Therapy Session Note  Patient Details  Name: Rachel Perkins MRN: [id number] Date of Birth: [birth year]  Today's Date: 05/23/2018 PT Individual Time: 1600-1657 PT Individual Time Calculation (min): 57 min   Short Term Goals: Week 1:  PT Short Term Goal 1 (Week 1): STG = LTG due to ELOS   Skilled Therapeutic Interventions/Progress Updates:    Pt supine in bed upon PT arrival, agreeable to therapy tx and denies pain. Pt transferred to sitting with supervision and donned shoes, increased time to tie shoes secondary to coordination/fine motor deficits. Pt ambulated to the gym with supervision, verbal cues for attention to left side for obstacle avoidance. Pt used nustep x 8 minutes this session on workload 5 for global strengthening and endurance. Pt ambulated to the gym with supervision x 150 ft. Pt worked on dynamic balance this session without UE support requiring CGA-min assist for balance including: standing on airex tossing ball against rebounder, forwards/backwards ambulation, side stepping and gait with eyes closed. Pt worked on standing balance and L UE neuro re-ed to perform peg board puzzle using L hand only to reach for pieces, standing on red wedge without UE support with supervision/CGA. Pt transferred to quadruped for neuro re-ed and performed reaching task using L hand to left side working on L attention. Pt transferred to long sitting for hamstring stretch 2 x 30 sec. Pt ambulated back to room with supervision and left seated in recliner with needs in reach and chair alarm set.    Therapy Documentation Precautions:  Precautions Precautions: Fall Precaution Comments: mild L inattention, mild apraxia, impulsivity  Restrictions Weight Bearing Restrictions: No    Therapy/Group: Individual Therapy  Netta Corrigan, PT, DPT 05/23/2018, 7:54 AM

## 2018-05-23 NOTE — Progress Notes (Signed)
  Nutrition Education Note  RD consulted for nutrition education.   Pt states she ate a large spinach salad the other night and had no Vitamin K the days prior. She believes this is what caused her INR to be abnormal.   Discussed the role of Vitamin K in blood clotting process. Encouraged pt to keep intake of vitamin K foods consistent (around 1/2 cup a day). Discussed making wise choices with supplementation.  Pt states does very well with counting her carbohydrates and takes her metformin appropriately. Her last A1C was 7.5 at her PCP visit. Pt is aware she needs to decrease her regular soda intake.   Briefly dicussed the importance of controlled and consistent carbohydrate intake throughout the day. Provided examples of ways to balance meals/snacks and encouraged intake of high-fiber, whole grain complex carbohydrates. RD provided "Carbohydrate Counting for People with Diabetes" handout from the Academy of Nutrition and Dietetics.   Expect great compliance.  Body mass index is 24.06 kg/m. Pt meets criteria for normal based on current BMI.  Current diet order is heart healthy/carb modified. Labs and medications reviewed. No further nutrition interventions warranted at this time. RD contact information provided. If additional nutrition issues arise, please re-consult RD.  Rachel Perkins RD, LDN Clinical Nutrition Pager # [phone]

## 2018-05-23 NOTE — Progress Notes (Signed)
[place] PHYSICAL MEDICINE & REHABILITATION PROGRESS NOTE   Subjective/Complaints:  No issues ovnernite    discussed the effects of food on INR,  warfarin with lovenox bridge  ROS: Denies CP, SOB, N/V/D  Objective:   No results found. Recent Labs    05/21/18 0530  WBC 7.3  HGB 14.2  HCT 41.5  PLT 222   Recent Labs    05/23/18 0659  NA 137  K 3.6  CL 105  CO2 24  GLUCOSE 118*  BUN 24*  CREATININE 0.93  CALCIUM 8.9    Intake/Output Summary (Last 24 hours) at 05/23/2018 0844 Last data filed at 05/23/2018 0700 Gross per 24 hour  Intake 1080 ml  Output -  Net 1080 ml     Physical Exam: Vital Signs Blood pressure (!) 152/87, pulse 66, temperature 97.6 F (36.4 C), temperature source Oral, resp. rate 19, height 5\' 3"  (1.6 m), weight 61.6 kg, SpO2 92 %.   Constitutional: No distress . Vital signs reviewed. HENT: Normocephalic.  Atraumatic. Eyes: EOMI. No discharge. Cardiovascular: RRR.  No JVD. Respiratory: CTA bilaterally.  Normal effort. GI: BS +. Non-distended. Musc: No edema or tenderness in extremities. Neurologic: Alert, mild dysarthria, + left neglect Motor: Grossly 5/5 throughout with LUE ataxia, stable Psych: Normal mood.  Normal affect.  Assessment/Plan: 1. Functional deficits secondary to RIght MCA infarct which require 3+ hours per day of interdisciplinary therapy in a comprehensive inpatient rehab setting.  Physiatrist is providing close team supervision and 24 hour management of active medical problems listed below.  Physiatrist and rehab team continue to assess barriers to discharge/monitor patient progress toward functional and medical goals  Care Tool:  Bathing    Body parts bathed by patient: Right arm, Left arm, Chest, Abdomen, Front perineal area, Buttocks, Right upper leg, Left upper leg, Right lower leg, Left lower leg, Face         Bathing assist Assist Level: Minimal Assistance - Patient > 75%     Upper Body  Dressing/Undressing Upper body dressing   What is the patient wearing?: Pull over shirt    Upper body assist Assist Level: Minimal Assistance - Patient > 75%    Lower Body Dressing/Undressing Lower body dressing      What is the patient wearing?: Underwear/pull up, Pants     Lower body assist Assist for lower body dressing: Minimal Assistance - Patient > 75%     Toileting Toileting    Toileting assist Assist for toileting: Supervision/Verbal cueing     Transfers Chair/bed transfer  Transfers assist     Chair/bed transfer assist level: Contact Guard/Touching assist     Locomotion Ambulation   Ambulation assist      Assist level: Contact Guard/Touching assist Assistive device: Other (comment)(none) Max distance: 500 ft   Walk 10 feet activity   Assist     Assist level: Contact Guard/Touching assist Assistive device: Other (comment)(none)   Walk 50 feet activity   Assist    Assist level: Contact Guard/Touching assist Assistive device: Other (comment)(none)    Walk 150 feet activity   Assist    Assist level: Contact Guard/Touching assist Assistive device: Other (comment)(none)    Walk 10 feet on uneven surface  activity   Assist     Assist level: Contact Guard/Touching assist Assistive device: Other (comment)(none )   Wheelchair     Assist Will patient use wheelchair at discharge?: No   Wheelchair activity did not occur: N/A  Wheelchair 50 feet with 2 turns activity    Assist    Wheelchair 50 feet with 2 turns activity did not occur: N/A       Wheelchair 150 feet activity     Assist Wheelchair 150 feet activity did not occur: N/A        Medical Problem List and Plan: 1.  Limitations with mobility, safety, self-care, cognitive deficits secondary to right MCA infarct.  Continue CIR 2.  Fibroelastoma aortic valve/DVT Prophylaxis/Anticoagulation: Pharmaceutical: Coumadin  INR subtherapeutic on 1/12  and 1/13 after being therapeutic. On Lovenox   Appreciate pharmacy Recs 3. Chronic back pain/Pain Management: Off gabapentin and oxycodone due to myoclonus.  4. Mood: LCSW to follow for evaluation and support.  5. Neuropsych: This patient is capable of making decisions on her own behalf. 6. Skin/Wound Care: Routine pressure relief measures.  7. Fluids/Electrolytes/Nutrition: Monitor I/O.   8. HTN: Monitor BP bid--continue metoprolol bid. Vitals:   05/22/18 1951 05/23/18 0421  BP: 108/69 (!) 152/87  Pulse: 87 66  Resp: 20 19  Temp: 97.6 F (36.4 C) 97.6 F (36.4 C)  SpO2: 100% 92%   Labile 1/13 9. T2DM: Hgb A1c-7.0. Continue metformin daily. Monitor BS ac/hs.  CBG (last 3)  Recent Labs    05/22/18 1628 05/22/18 2139 05/23/18 0642  GLUCAP 115* 131* 115*   Controlled 1/13 10.  Sleep disturbance: Continue melatonin at nights. sleep wake chart 11. Delirium: Continue Seroquel bid for now.   12.  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

## 2018-05-24 ENCOUNTER — Inpatient Hospital Stay (HOSPITAL_COMMUNITY): Payer: Medicare Other

## 2018-05-24 ENCOUNTER — Inpatient Hospital Stay (HOSPITAL_COMMUNITY): Payer: Medicare Other | Admitting: Speech Pathology

## 2018-05-24 ENCOUNTER — Inpatient Hospital Stay (HOSPITAL_COMMUNITY): Payer: Medicare Other | Admitting: Physical Therapy

## 2018-05-24 LAB — CBC
HCT: 39.3 % (ref 36.0–46.0)
Hemoglobin: 12.9 g/dL (ref 12.0–15.0)
MCH: 31.4 pg (ref 26.0–34.0)
MCHC: 32.8 g/dL (ref 30.0–36.0)
MCV: 95.6 fL (ref 80.0–100.0)
Platelets: 214 10*3/uL (ref 150–400)
RBC: 4.11 MIL/uL (ref 3.87–5.11)
RDW: 13.7 % (ref 11.5–15.5)
WBC: 6.8 10*3/uL (ref 4.0–10.5)
nRBC: 0 % (ref 0.0–0.2)

## 2018-05-24 LAB — GLUCOSE, CAPILLARY
Glucose-Capillary: 106 mg/dL — ABNORMAL HIGH (ref 70–99)
Glucose-Capillary: 127 mg/dL — ABNORMAL HIGH (ref 70–99)
Glucose-Capillary: 200 mg/dL — ABNORMAL HIGH (ref 70–99)
Glucose-Capillary: 119 mg/dL — ABNORMAL HIGH (ref 70–99)

## 2018-05-24 LAB — PROTIME-INR
INR: 1.7
Prothrombin Time: 19.7 seconds — ABNORMAL HIGH (ref 11.4–15.2)

## 2018-05-24 MED ORDER — WARFARIN SODIUM 5 MG PO TABS
5.0000 mg | ORAL_TABLET | Freq: Once | ORAL | Status: AC
  Administered 2018-05-24: 5 mg via ORAL
  Filled 2018-05-24: qty 1

## 2018-05-24 NOTE — Progress Notes (Signed)
ANTICOAGULATION CONSULT NOTE - Initial Consult  Pharmacy Consult for Warfarin and Enoxaparin Indication: stroke & concern for R-mobile thrombus  Allergies  Allergen Reactions  . Codeine Itching  . Penicillins Rash    DID THE REACTION INVOLVE: Swelling of the face/tongue/throat, SOB, or low BP? Unknown Sudden or severe rash/hives, skin peeling, or the inside of the mouth or nose? Unknown Did it require medical treatment? Unknown When did it last happen?unk If all above answers are "NO", may proceed with cephalosporin use.   . Sulfa Antibiotics Rash  . Lisinopril Cough  . Other Other (See Comments)    NUTS ?LOBSTER unknown   Patient Measurements: Height: 5\' 3"  (160 cm) Weight: 135 lb 12.9 oz (61.6 kg) IBW/kg (Calculated) : 52.4 Ht: 5'4" Wt: 64.8 kg  Vital Signs: Temp: 97.7 F (36.5 C) (01/14 0400) Temp Source: Oral (01/14 0400) BP: 124/62 (01/14 0400) Pulse Rate: 71 (01/14 0400)  Labs: Recent Labs    05/22/18 0637 05/23/18 0659 05/24/18 0539  HGB  --   --  12.9  HCT  --   --  39.3  PLT  --   --  214  LABPROT 20.3* 18.0* 19.7*  INR 1.76 1.50 1.70  CREATININE  --  0.93  --     Estimated Creatinine Clearance: 47.2 mL/min (by C-G formula based on SCr of 0.93 mg/dL).   Medical History: Past Medical History:  Diagnosis Date  . Anginal pain ([place])   . Arthritis   . CAD (coronary artery disease)    a. 09/25/13 s/p DES to [facility] and DES to pRCA.  Marland Kitchen Chronic left-sided back pain    has had ESI/Dr. Francesco Runner  . DM (diabetes mellitus) ([place])    a. diet controlled.   Marland Kitchen HTN (hypertension)   . Myocardial infarction [facility]) 09/2013   nstemi  . Tobacco abuse    Assessment: 1 YOF transferred to CIR from [place] on 1/8 for rehab s/p recent CVA on 1/1. Warf for new CVA, R-heart mobile lesion/thrombus. Was started at [facility] before transferring to Rehab. INR remains subtherapeutic but back up to 1.7. CBC stable. Plan to cover low INR with Lovenox until therapeutic  Goal of  Therapy:  INR 2-3 Monitor platelets by anticoagulation protocol: Yes   Plan:  Give Coumadin 5mg  PO x 1 Continue enoxaparin 60mg  [place] Q12h until INR therapeutic Monitor daily INR, CBC, s/s of bleed  Elenor Quinones, PharmD, BCPS, [place] Clinical Pharmacist Phone number [phone] 05/24/2018 7:31 AM

## 2018-05-24 NOTE — Progress Notes (Signed)
[place] PHYSICAL MEDICINE & REHABILITATION PROGRESS NOTE   Subjective/Complaints:  Appreciate dietary consult, pt states husband works part time but has been to visit, expects neighbors will "check on her" when he is at work  ROS: Denies CP, SOB, N/V/D  Objective:   No results found. Recent Labs    05/24/18 0539  WBC 6.8  HGB 12.9  HCT 39.3  PLT 214   Recent Labs    05/23/18 0659  NA 137  K 3.6  CL 105  CO2 24  GLUCOSE 118*  BUN 24*  CREATININE 0.93  CALCIUM 8.9    Intake/Output Summary (Last 24 hours) at 05/24/2018 0850 Last data filed at 05/24/2018 0700 Gross per 24 hour  Intake 360 ml  Output -  Net 360 ml     Physical Exam: Vital Signs Blood pressure 124/62, pulse 71, temperature 97.7 F (36.5 C), temperature source Oral, resp. rate 18, height 5\' 3"  (1.6 m), weight 61.6 kg, SpO2 99 %.   Constitutional: No distress . Vital signs reviewed. HENT: Normocephalic.  Atraumatic. Eyes: EOMI. No discharge. Cardiovascular: RRR.  No JVD. Respiratory: CTA bilaterally.  Normal effort. GI: BS +. Non-distended. Musc: No edema or tenderness in extremities. Neurologic: Alert, mild dysarthria, + left neglect Motor: Grossly 5/5 throughout with LUE ataxia, stable Psych: Normal mood.  Normal affect.  Assessment/Plan: 1. Functional deficits secondary to RIght MCA infarct which require 3+ hours per day of interdisciplinary therapy in a comprehensive inpatient rehab setting.  Physiatrist is providing close team supervision and 24 hour management of active medical problems listed below.  Physiatrist and rehab team continue to assess barriers to discharge/monitor patient progress toward functional and medical goals  Care Tool:  Bathing    Body parts bathed by patient: Right arm, Left arm, Chest, Abdomen, Front perineal area, Buttocks, Right upper leg, Left upper leg, Right lower leg, Left lower leg, Face         Bathing assist Assist Level: Supervision/Verbal  cueing     Upper Body Dressing/Undressing Upper body dressing   What is the patient wearing?: Pull over shirt, Bra    Upper body assist Assist Level: Minimal Assistance - Patient > 75%    Lower Body Dressing/Undressing Lower body dressing      What is the patient wearing?: Underwear/pull up, Pants     Lower body assist Assist for lower body dressing: Supervision/Verbal cueing     Toileting Toileting    Toileting assist Assist for toileting: Supervision/Verbal cueing     Transfers Chair/bed transfer  Transfers assist     Chair/bed transfer assist level: Contact Guard/Touching assist     Locomotion Ambulation   Ambulation assist      Assist level: Supervision/Verbal cueing Assistive device: Other (comment)(none) Max distance: 150 ft   Walk 10 feet activity   Assist     Assist level: Supervision/Verbal cueing Assistive device: Other (comment)(none)   Walk 50 feet activity   Assist    Assist level: Supervision/Verbal cueing Assistive device: Other (comment)(none)    Walk 150 feet activity   Assist    Assist level: Supervision/Verbal cueing Assistive device: Other (comment)(none)    Walk 10 feet on uneven surface  activity   Assist     Assist level: Contact Guard/Touching assist Assistive device: Other (comment)(none )   Wheelchair     Assist Will patient use wheelchair at discharge?: No   Wheelchair activity did not occur: N/A         Wheelchair 50 feet with  2 turns activity    Assist    Wheelchair 50 feet with 2 turns activity did not occur: N/A       Wheelchair 150 feet activity     Assist Wheelchair 150 feet activity did not occur: N/A        Medical Problem List and Plan: 1.  Limitations with mobility, safety, self-care, cognitive deficits secondary to right MCA infarct.  Continue CIR 2.  Fibroelastoma aortic valve/DVT Prophylaxis/Anticoagulation: Pharmaceutical: Coumadin  INR subtherapeutic on  1/12, 1/13 after being therapeutic. On Lovenox 60mg  sq BID INR climbing slightly to 1.7   [place] D/C 1/16, hopefully off lovenox by that time Dietary regulation will be important to maintain therapeutic INR 3. Chronic back pain/Pain Management: Off gabapentin and oxycodone due to myoclonus.  4. Mood: LCSW to follow for evaluation and support.  5. Neuropsych: This patient is capable of making decisions on her own behalf. 6. Skin/Wound Care: Routine pressure relief measures.  7. Fluids/Electrolytes/Nutrition: Monitor I/O.   8. HTN: Monitor BP bid--continue metoprolol bid. Vitals:   05/23/18 1931 05/24/18 0400  BP: 123/80 124/62  Pulse: 87 71  Resp: 15 18  Temp: (!) 97.4 F (36.3 C) 97.7 F (36.5 C)  SpO2: 100% 99%   Labile 1/14 9. T2DM: Hgb A1c-7.0. Continue metformin daily. Monitor BS ac/hs.  CBG (last 3)  Recent Labs    05/23/18 1702 05/23/18 2216 05/24/18 0631  GLUCAP 118* 118* 106*   Controlled 1/14 10.  Sleep disturbance: Continue melatonin at nights. sleep wake chart 11. Delirium: Continue Seroquel bid for now.   12.  Fibroelastoma aortic valve- outpt CVTS f/u for removal  LOS: 6 days A FACE TO FACE EVALUATION WAS PERFORMED  Charlett Blake 05/24/2018, 8:50 AM

## 2018-05-24 NOTE — Progress Notes (Signed)
Occupational Therapy Session Note  Patient Details  Name: Rachel Perkins MRN: [id number] Date of Birth: 1951-04-09  Today's Date: 05/24/2018 OT Individual Time: 1300-1425 OT Individual Time Calculation (min): 85 min    Short Term Goals: Week 1:  OT Short Term Goal 1 (Week 1): STG = LTGs due to ELOS  Skilled Therapeutic Interventions/Progress Updates:    Pt resting in w/c upon arrival.  OT intervention with focus on functional amb without AD, problem solving simple tasks/home mgmt tasks, safety awareness, practicing tub transfers, path finding, attention to L, and activity tolerance to increase independence with BADLs.  Pt initially engaged in replicating patterns with colored peg board-one simple and one moderately complex.  Pt able to complete simple patter with more than a reasonable amount of time.  Pt required min verbal cues to complete moderately complex pattern. Pt acknowledged that she was having difficulty following pattern and problem solving placement of pets.  Pt practiced tub bench transfers at supervision level, stepping over into tub.  Pt engaged in [place] activities with mixed results in two trials:1)LUQ-2.24, LLQ-3.11, RUQ-1.68, RLQ-2.25 2)LUQ-1.55, LLQ-1.48, RUQ-2.93, RLQ-2.39. Pt amb without AD throughout unit and onto central and challenged to find room.  Pt completed challenge successfully X 2. Recommended that pt have 24 hour supervision secondary to L inattention and visual deficits.  Pt and husband verbalized understanding.  Pt remained seated in w/c with all needs within reach, chair alarm activated, and husbnad present.  Therapy Documentation Precautions:  Precautions Precautions: Fall Precaution Comments: mild L inattention, mild apraxia, impulsivity  Restrictions Weight Bearing Restrictions: No Pain:  Pt denies pain   Therapy/Group: Individual Therapy  Leroy Libman 05/24/2018, 2:37 PM

## 2018-05-24 NOTE — Progress Notes (Signed)
Social Work Patient ID: Rachel Perkins, female   DOB: Jan 04, 1951, 68 y.o.   MRN: [id number] Spoke with husband to inform team feels she will be ready for discharge Thursday and to schedule family education for tomorrow at 2:00 pm. He can not get here sooner than this, will let team know. He would like the MD to call him for his questions.

## 2018-05-24 NOTE — Progress Notes (Signed)
Physical Therapy Session Note  Patient Details  Name: Rachel Perkins MRN: [id number] Date of Birth: 1951-02-20  Today's Date: 05/24/2018 PT Individual Time: 0910-1000 PT Individual Time Calculation (min): 50 min   Short Term Goals: Week 1:  PT Short Term Goal 1 (Week 1): STG = LTG due to ELOS   Skilled Therapeutic Interventions/Progress Updates:   Pt in w/c and agreeable to therapy, denies pain. Session focused on functional ambulation and endurance w/ community mobility. Ambulated around hospital for 30+ minutes w/ supervision in controlled environment and close supervision in community environment for safety. No overt LOB. Community environment involved negotiating obstacles, uneven surfaces, stairs, and negotiating busy environments. Intermittently added in dual task w/ naming activity. Decreased speed w/ cognitive dual task, however no observable effects on balance. 1 seated rest break during community gait, ambulated >1000' at a time. Performed Berg Balance Scale as detailed below, pt remains at risk for falls. Returned to room and ended session in w/c, all needs in reach.  Therapy Documentation Precautions:  Precautions Precautions: Fall Precaution Comments: mild L inattention, mild apraxia, impulsivity  Restrictions Weight Bearing Restrictions: No Pain: Pain Assessment Pain Scale: 0-10 Pain Score: 0-No pain Balance: Standardized Balance Assessment Standardized Balance Assessment: Berg Balance Test Berg Balance Test Sit to Stand: Able to stand without using hands and stabilize independently Standing Unsupported: Able to stand safely 2 minutes Sitting with Back Unsupported but Feet Supported on Floor or Stool: Able to sit safely and securely 2 minutes Stand to Sit: Sits safely with minimal use of hands Transfers: Able to transfer safely, minor use of hands Standing Unsupported with Eyes Closed: Able to stand 10 seconds with supervision Standing Ubsupported with Feet Together:  Able to place feet together independently and stand for 1 minute with supervision From Standing, Reach Forward with Outstretched Arm: Can reach forward >12 cm safely (5") From Standing Position, Pick up Object from Floor: Able to pick up shoe, needs supervision From Standing Position, Turn to Look Behind Over each Shoulder: Turn sideways only but maintains balance Turn 360 Degrees: Able to turn 360 degrees safely one side only in 4 seconds or less Standing Unsupported, Alternately Place Feet on Step/Stool: Able to complete 4 steps without aid or supervision Standing Unsupported, One Foot in Front: Able to take small step independently and hold 30 seconds Standing on One Leg: Tries to lift leg/unable to hold 3 seconds but remains standing independently Total Score: 42  Therapy/Group: Individual Therapy  Keishia Ground Clent Demark 05/24/2018, 9:59 AM

## 2018-05-24 NOTE — Plan of Care (Signed)
  Problem: RH BOWEL ELIMINATION Goal: RH STG MANAGE BOWEL WITH ASSISTANCE Description STG Manage Bowel with min Assistance.  Outcome: Progressing Goal: RH STG MANAGE BOWEL W/MEDICATION W/ASSISTANCE Description STG Manage Bowel with Medication with min Assistance.  Outcome: Progressing   Problem: RH SKIN INTEGRITY Goal: RH STG SKIN FREE OF INFECTION/BREAKDOWN Description Will have no acquired skin break down in 14 days  Outcome: Progressing Goal: RH STG MAINTAIN SKIN INTEGRITY WITH ASSISTANCE Description STG Maintain Skin Integrity With min Assistance.  Outcome: Progressing   Problem: RH SAFETY Goal: RH STG ADHERE TO SAFETY PRECAUTIONS W/ASSISTANCE/DEVICE Description STG Adhere to Safety Precautions With min  Assistance/Device.  Outcome: Progressing Goal: RH STG DECREASED RISK OF FALL WITH ASSISTANCE Description STG Decreased Risk of Fall With min Assistance.  Outcome: Progressing   Problem: RH COGNITION-NURSING Goal: RH STG USES MEMORY AIDS/STRATEGIES W/ASSIST TO PROBLEM SOLVE Description STG Uses Memory Aids/Strategies With mod I Assistance to Problem Solve.  Outcome: Progressing Goal: RH STG ANTICIPATES NEEDS/CALLS FOR ASSIST W/ASSIST/CUES Description STG Anticipates Needs/Calls for Assist With mod I Assistance/Cues.  Outcome: Progressing   Problem: RH PAIN MANAGEMENT Goal: RH STG PAIN MANAGED AT OR BELOW PT'S PAIN GOAL Description Pain scale <4/10  Outcome: Progressing   Problem: RH KNOWLEDGE DEFICIT Goal: RH STG INCREASE KNOWLEDGE OF DIABETES Outcome: Progressing Goal: RH STG INCREASE KNOWLEDGE OF HYPERTENSION Outcome: Progressing Goal: RH STG INCREASE KNOWLEGDE OF HYPERLIPIDEMIA Outcome: Progressing Goal: RH STG INCREASE KNOWLEDGE OF STROKE PROPHYLAXIS Outcome: Progressing   Problem: Consults Goal: Skin Care Protocol Initiated - if Braden Score 18 or less Description If consults are not indicated, leave blank or document N/A Outcome: Progressing Goal:  Nutrition Consult-if indicated Outcome: Progressing Goal: Diabetes Guidelines if Diabetic/Glucose > 140 Description If diabetic or lab glucose is > 140 mg/dl - Initiate Diabetes/Hyperglycemia Guidelines & Document Interventions  Outcome: Progressing

## 2018-05-24 NOTE — Progress Notes (Signed)
Speech Language Pathology Daily Session Note  Patient Details  Name: THALIA TURKINGTON MRN: [id number] Date of Birth: 06/16/1950  Today's Date: 05/24/2018 SLP Individual Time: 0730-0830 SLP Individual Time Calculation (min): 60 min  Short Term Goals: Week 1: SLP Short Term Goal 1 (Week 1): STGs=LTGs due to short length of stay   Skilled Therapeutic Interventions: Skilled treatment session focused on cognitive goals. SLP facilitated session by providing extra time and overall Min A verbal cues for problem solving in regards to safety and organization during dressing and grooming tasks. SLP also facilitated session by providing supervision level verbal cues for recall of her current medications and their functions but required additional cues for mental flexibility in regards to frequency of medications. Patient left upright in wheelchair with alarm on and all needs within reach. Continue with current plan of care.      Pain Pain Assessment Pain Scale: 0-10 Pain Score: 0-No pain  Therapy/Group: Individual Therapy  Halie Gass 05/24/2018, 9:02 AM

## 2018-05-25 ENCOUNTER — Inpatient Hospital Stay (HOSPITAL_COMMUNITY): Payer: Medicare Other | Admitting: Speech Pathology

## 2018-05-25 ENCOUNTER — Inpatient Hospital Stay (HOSPITAL_COMMUNITY): Payer: Medicare Other | Admitting: Physical Therapy

## 2018-05-25 ENCOUNTER — Inpatient Hospital Stay (HOSPITAL_COMMUNITY): Payer: Self-pay

## 2018-05-25 ENCOUNTER — Inpatient Hospital Stay (HOSPITAL_COMMUNITY): Payer: Medicare Other

## 2018-05-25 LAB — GLUCOSE, CAPILLARY
Glucose-Capillary: 101 mg/dL — ABNORMAL HIGH (ref 70–99)
Glucose-Capillary: 114 mg/dL — ABNORMAL HIGH (ref 70–99)
Glucose-Capillary: 109 mg/dL — ABNORMAL HIGH (ref 70–99)
Glucose-Capillary: 88 mg/dL (ref 70–99)

## 2018-05-25 LAB — PROTIME-INR
INR: 1.89
Prothrombin Time: 21.4 seconds — ABNORMAL HIGH (ref 11.4–15.2)

## 2018-05-25 MED ORDER — WARFARIN SODIUM 5 MG PO TABS
5.0000 mg | ORAL_TABLET | Freq: Once | ORAL | Status: AC
  Administered 2018-05-25: 5 mg via ORAL
  Filled 2018-05-25: qty 1

## 2018-05-25 NOTE — Progress Notes (Addendum)
Physical Therapy Discharge Summary  Patient Details  Name: Rachel Perkins MRN: [id number] Date of Birth: 07/19/1950  Today's Date: 05/26/2018 PT Individual Time: 0800-0855 AND 1600-1657 PT Individual Time Calculation (min): 55 min AND AND 57 min  Session 1: Pt in w/c and agreeable to therapy, no c/o pain. Requesting to shower this morning. Pt collected clothing from drawers, toileted, and performed all bathing/dressing tasks w/ supervision. Bathing and dressing performed at seated level. Pt initially attempting to don pants while standing, needed verbal cues for safety and to sit while donning pants. Also needed assistance w/ fastening bra, which she plans to have her husband help her with at home. Sit<>stands while bathing and dressing and dynamic standing balance w/ supervision and use of grab bars for support. Pt utilizing BUEs equally during all functional tasks. Static and dynamic standing balance at sink during grooming tasks w/ supervision as well. Ambulated to/from therapy gym w/ supervision w/o AD. NuStep 8 min @ level 5 for global strengthening and endurance training. Returned to room and ended session in w/c, all needs in reach.   Session 2:  Pt in recliner and agreeable to therapy, no c/o pain. Ambulated around unit w/ supervision w/o AD. Performed car transfer, stair negotiation x12 steps w/ bilateral handrails, and floor transfer w/ supervision. Needed min verbal cueing for floor transfer technique. Worked on dynamic standing balance while performing Wii bowling w/ verbal cues for technique 2/2 mild apraxia that remains. Worked on L attention and L body awareness while performing puzzle and peg board task. Pt w/ improved performance since performing tasks with this therapist last week. Required only occasional verbal/visual cues throughout. Returned to room and provided supervision while pt packed up her belongings. Ended session in recliner, all needs in reach.   Patient has met 12 of 12  long term goals due to improved activity tolerance, improved balance, improved postural control, increased strength, ability to compensate for deficits and improved awareness.  Patient to discharge at an ambulatory level Supervision.   Patient's care partner is independent to provide the necessary cognitive assistance at discharge. Husband will provide cueing as needed to address pt's L inattention.   All goals met.   Recommendation:  Patient will benefit from ongoing skilled PT services in home health setting to continue to advance safe functional mobility, address ongoing impairments in balance, strength, attention, and minimize fall risk.  Equipment: No equipment provided  Reasons for discharge: treatment goals met  Patient/family agrees with progress made and goals achieved: Yes  PT Discharge Precautions/Restrictions Precautions Precautions: Fall Restrictions Weight Bearing Restrictions: No Vital Signs Therapy Vitals Temp: 97.8 F (36.6 C) Temp Source: Oral Pulse Rate: 73 Resp: 16 BP: 122/82 Patient Position (if appropriate): Sitting Oxygen Therapy SpO2: 99 % O2 Device: Room Air Pain Pain Assessment Pain Scale: 0-10 Pain Score: 0-No pain Vision/Perception  Perception Perception: Within Functional Limits Praxis Praxis: Impaired Praxis Impairment Details: Motor planning Praxis-Other Comments: mild apraxia remains, improved since eval  Cognition Overall Cognitive Status: Impaired/Different from baseline Arousal/Alertness: Awake/alert Orientation Level: Oriented X4 Selective Attention: Appears intact Memory: Appears intact Awareness: Appears intact Problem Solving: Impaired Problem Solving Impairment: Functional complex Safety/Judgment: Appears intact Comments: decreased safety awareness, improved since eval Sensation Sensation Light Touch: Appears Intact Coordination Gross Motor Movements are Fluid and Coordinated: No Fine Motor Movements are Fluid and  Coordinated: No Heel Shin Test: Impaired LLE Motor  Motor Motor: Hemiplegia Motor - Discharge Observations: Mild L hemi, UE>LE, generalized weakness  Mobility Bed Mobility Bed  Mobility: Rolling Right;Rolling Left;Supine to Sit;Sit to Supine Rolling Right: Independent Rolling Left: Independent Supine to Sit: Independent Sit to Supine: Independent Transfers Transfers: Sit to Stand;Stand to Sit;Stand Pivot Transfers Sit to Stand: Independent Stand to Sit: Independent Stand Pivot Transfers: Independent Transfer (Assistive device): None Locomotion  Gait Ambulation: Yes Gait Assistance: Supervision/Verbal cueing Gait Distance (Feet): 150 Feet Assistive device: None Gait Gait: Yes Gait Pattern: Impaired Gait Pattern: Decreased stance time - left;Decreased trunk rotation;Poor foot clearance - left Stairs / Additional Locomotion Stairs: Yes Stairs Assistance: Supervision/Verbal cueing Stair Management Technique: Two rails Number of Stairs: 12 Height of Stairs: 6 Ramp: Supervision/Verbal cueing Curb: Supervision/Verbal cueing Wheelchair Mobility Wheelchair Mobility: No  Trunk/Postural Assessment  Cervical Assessment Cervical Assessment: Within Functional Limits Thoracic Assessment Thoracic Assessment: Within Functional Limits Lumbar Assessment Lumbar Assessment: Within Functional Limits Postural Control Postural Control: Within Functional Limits  Balance Balance Balance Assessed: Yes Static Sitting Balance Static Sitting - Balance Support: No upper extremity supported;Feet supported Static Sitting - Level of Assistance: 6: Modified independent (Device/Increase time) Dynamic Sitting Balance Dynamic Sitting - Balance Support: During functional activity;Feet supported Dynamic Sitting - Level of Assistance: 6: Modified independent (Device/Increase time) Static Standing Balance Static Standing - Balance Support: No upper extremity supported;During functional  activity Static Standing - Level of Assistance: 5: Stand by assistance Dynamic Standing Balance Dynamic Standing - Balance Support: No upper extremity supported;During functional activity Dynamic Standing - Level of Assistance: 5: Stand by assistance  Berg Balance Scale: 42/56 on 05/25/2018 Extremity Assessment  RLE Assessment RLE Assessment: Within Functional Limits LLE Assessment LLE Assessment: Exceptions to [facility] General Strength Comments: 5/5 throughout, 4/5 hip flexion    Bobbie Stack, PT, DPT 05/26/2018, 9:05 AM

## 2018-05-25 NOTE — Progress Notes (Signed)
[facility] for Warfarin and Enoxaparin Indication: stroke & concern for R-mobile thrombus  Allergies  Allergen Reactions  . Codeine Itching  . Penicillins Rash    DID THE REACTION INVOLVE: Swelling of the face/tongue/throat, SOB, or low BP? Unknown Sudden or severe rash/hives, skin peeling, or the inside of the mouth or nose? Unknown Did it require medical treatment? Unknown When did it last happen?unk If all above answers are "NO", may proceed with cephalosporin use.   . Sulfa Antibiotics Rash  . Lisinopril Cough  . Other Other (See Comments)    NUTS ?LOBSTER unknown   Patient Measurements: Height: 5\' 3"  (160 cm) Weight: 135 lb 11.2 oz (61.6 kg) IBW/kg (Calculated) : 52.4 Ht: 5'4" Wt: 64.8 kg  Vital Signs: Temp: 98.2 F (36.8 C) (01/15 0530) Temp Source: Oral (01/15 0530) BP: 131/68 (01/15 0530) Pulse Rate: 72 (01/15 0530)  Labs: Recent Labs    05/23/18 0659 05/24/18 0539 05/25/18 0516  HGB  --  12.9  --   HCT  --  39.3  --   PLT  --  214  --   LABPROT 18.0* 19.7* 21.4*  INR 1.50 1.70 1.89  CREATININE 0.93  --   --     Estimated Creatinine Clearance: 47.2 mL/min (by C-G formula based on SCr of 0.93 mg/dL).   Assessment: 50 YOF transferred to CIR from [place] on 1/8 for rehab s/p recent CVA on 1/1. Warf for new CVA, R-heart mobile lesion/thrombus. Was started at [facility] before transferring to Rehab. INR remains subtherapeutic but back up to 1.89. CBC stable. Plan to cover low INR with Lovenox until therapeutic.  Goal of Therapy:  INR 2-3 Monitor platelets by anticoagulation protocol: Yes   Plan:  Give Coumadin 5mg  PO x 1 Continue enoxaparin 60mg   Q12h until INR therapeutic Monitor daily INR, CBC, s/s of bleed Planning to discharge 1/16   Renold Genta, PharmD, BCPS Clinical Pharmacist Clinical phone for 05/25/2018 until 3p is [phone] 05/25/2018 7:58 AM  **Pharmacist phone directory can now be found on  [email or web address] listed under [place]**

## 2018-05-25 NOTE — Progress Notes (Signed)
Social Work Patient ID: Rachel Perkins, female   DOB: Nov 04, 1950, 68 y.o.   MRN: [id number] Met with pt to inform team conference goals supervision level and discharge date 1/17, instead of Thursday due to her INR. She is hoping this will go down and be at the level MD wants it to be on Friday. Her husband is coming in today for education and will see him when here. Work toward discharge Friday.

## 2018-05-25 NOTE — Progress Notes (Addendum)
[place] PHYSICAL MEDICINE & REHABILITATION PROGRESS NOTE   Subjective/Complaints:  Discussed labwork,still sub therapeutic , but improving No hx of kidney disease , took on Calcium tab at home as a supplement, not on phoslo at home  ROS: Denies CP, SOB, N/V/D  Objective:   No results found. Recent Labs    05/24/18 0539  WBC 6.8  HGB 12.9  HCT 39.3  PLT 214   Recent Labs    05/23/18 0659  NA 137  K 3.6  CL 105  CO2 24  GLUCOSE 118*  BUN 24*  CREATININE 0.93  CALCIUM 8.9    Intake/Output Summary (Last 24 hours) at 05/25/2018 0811 Last data filed at 05/24/2018 1745 Gross per 24 hour  Intake 480 ml  Output -  Net 480 ml     Physical Exam: Vital Signs Blood pressure 131/68, pulse 72, temperature 98.2 F (36.8 C), temperature source Oral, resp. rate 17, height _0  (1.6 m), weight 61.6 kg, SpO2 99 %.   Constitutional: No distress . Vital signs reviewed. HENT: Normocephalic.  Atraumatic. Eyes: EOMI. No discharge. Cardiovascular: RRR.  No JVD. Respiratory: CTA bilaterally.  Normal effort. GI: BS +. Non-distended. Musc: No edema or tenderness in extremities. Neurologic: Alert, mild dysarthria, + left neglect Motor: Grossly 5/5 throughout with LUE ataxia, stable Psych: Normal mood.  Normal affect.  Assessment/Plan: 1. Functional deficits secondary to RIght MCA infarct which require 3+ hours per day of interdisciplinary therapy in a comprehensive inpatient rehab setting.  Physiatrist is providing close team supervision and 24 hour management of active medical problems listed below.  Physiatrist and rehab team continue to assess barriers to discharge/monitor patient progress toward functional and medical goals  Care Tool:  Bathing    Body parts bathed by patient: Right arm, Left arm, Chest, Abdomen, Front perineal area, Buttocks, Right upper leg, Left upper leg, Right lower leg, Left lower leg, Face         Bathing assist Assist Level:  Supervision/Verbal cueing     Upper Body Dressing/Undressing Upper body dressing   What is the patient wearing?: Pull over shirt, Bra    Upper body assist Assist Level: Minimal Assistance - Patient > 75%    Lower Body Dressing/Undressing Lower body dressing      What is the patient wearing?: Underwear/pull up, Pants     Lower body assist Assist for lower body dressing: Supervision/Verbal cueing     Toileting Toileting    Toileting assist Assist for toileting: Supervision/Verbal cueing     Transfers Chair/bed transfer  Transfers assist     Chair/bed transfer assist level: Supervision/Verbal cueing     Locomotion Ambulation   Ambulation assist      Assist level: Supervision/Verbal cueing Assistive device: Other (comment)(none) Max distance: >1000'   Walk 10 feet activity   Assist     Assist level: Supervision/Verbal cueing Assistive device: Other (comment)(none)   Walk 50 feet activity   Assist    Assist level: Supervision/Verbal cueing Assistive device: Other (comment)(none)    Walk 150 feet activity   Assist    Assist level: Supervision/Verbal cueing Assistive device: Other (comment)(none)    Walk 10 feet on uneven surface  activity   Assist     Assist level: Supervision/Verbal cueing Assistive device: Other (comment)(none )   Wheelchair     Assist Will patient use wheelchair at discharge?: No   Wheelchair activity did not occur: N/A         Wheelchair 50 feet with 2  turns activity    Assist    Wheelchair 50 feet with 2 turns activity did not occur: N/A       Wheelchair 150 feet activity     Assist Wheelchair 150 feet activity did not occur: N/A        Medical Problem List and Plan: 1.  Limitations with mobility, safety, self-care, cognitive deficits secondary to right MCA infarct.  Continue CIR, Team conference today please see physician documentation under team conference tab, met with team  face-to-face to discuss problems,progress, and goals. Formulized individual treatment plan based on medical history, underlying problem and comorbidities. 2.  Fibroelastoma aortic valve/DVT Prophylaxis/Anticoagulation: Pharmaceutical: Coumadin  INR subtherapeutic on 1/12, 1/13 after being therapeutic. On Lovenox 32m sq BID INR climbing slightly to 1.7   Appreciate pharmacy Recs- will need to push back D/C date to 1/17 3. Chronic back pain/Pain Management: Off gabapentin and oxycodone due to myoclonus.  4. Mood: LCSW to follow for evaluation and support.  5. Neuropsych: This patient is capable of making decisions on her own behalf. 6. Skin/Wound Care: Routine pressure relief measures.  7. Fluids/Electrolytes/Nutrition: Monitor I/O.   8. HTN: Monitor BP bid--continue metoprolol bid. Vitals:   05/24/18 1951 05/25/18 0530  BP: 126/75 131/68  Pulse: 79 72  Resp: 18 17  Temp: 98.6 F (37 C) 98.2 F (36.8 C)  SpO2: 97% 99%   Controlled 1/15 9. T2DM: Hgb A1c-7.0. Continue metformin daily. Monitor BS ac/hs.  CBG (last 3)  Recent Labs    05/24/18 1700 05/24/18 2116 05/25/18 0623  GLUCAP 200* 119* 101*   Controlled 1/15 10.  Sleep disturbance: Continue melatonin at nights. sleep wake chart 11. Delirium: Continue Seroquel bid for now.   12.  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

## 2018-05-25 NOTE — Progress Notes (Signed)
Occupational Therapy Session Note  Patient Details  Name: Rachel Perkins MRN: [id number] Date of Birth: [birth year]  Today's Date: 05/25/2018 OT Individual Time: 1300-1430 OT Individual Time Calculation (min): 90 min    Short Term Goals: Week 1:  OT Short Term Goal 1 (Week 1): STG = LTGs due to ELOS  Skilled Therapeutic Interventions/Progress Updates:    OT intervention with focus on functional amb without AD, LUE functional use, attention to L, standing balance, tub transfers, family education, safety awareness, and activity tolerance to increase independence with BADLs.  Pt amb throughout unit and hospital to main entrance to purchase cup of coffee.  Pt amb outside on uneven surfaces and returned to room.  Pt required min verbal cues to look to her L when approaching intersections.  Pt's husband joined session and pt practiced stepping over into tub using wall for support.  Recommended that husband walk on pt's L when up and about.  Pt verbalized understanding.  Husband verbalizes understanding of recommendation for 24 hour supervision. Husband verbalizes understanding recommendation that he or other caregivers walk next to her when she is up and about.  Pt remained in w/c with belt alarm activated and all needs within reach. Husband present.  Therapy Documentation Precautions:  Precautions Precautions: Fall Precaution Comments: mild L inattention, mild apraxia, impulsivity  Restrictions Weight Bearing Restrictions: No Pain:  Pt denies pain   Therapy/Group: Individual Therapy  Leroy Libman 05/25/2018, 2:44 PM

## 2018-05-25 NOTE — Progress Notes (Signed)
Speech Language Pathology Daily Session Note  Patient Details  Name: Rachel Perkins MRN: [id number] Date of Birth: October 29, 1950  Today's Date: 05/25/2018 SLP Individual Time: 1430-1515 SLP Individual Time Calculation (min): 45 min  Short Term Goals: Week 1: SLP Short Term Goal 1 (Week 1): STGs=LTGs due to short length of stay   Skilled Therapeutic Interventions: Skilled treatment session focused on completion of patient and family education with the patient's husband. Both educated on patient's current cognitive impairments and strategies to utilize at home to maximize problem solving, sustained attention, scanning, recall and overall safety, especially in regards to 24 hour supervision. Patient and her husband verbalized understanding and agreement. Patient left upright in wheelchair with husband present. Continue with current plan of care.      Pain No/Denies Pain   Therapy/Group: Individual Therapy  Azzie Thiem 05/25/2018, 3:15 PM

## 2018-05-25 NOTE — Patient Care Conference (Signed)
Inpatient RehabilitationTeam Conference and Plan of Care Update Date: 05/25/2018   Time: 11:30 AM    Patient Name: Rachel Perkins [facility]      Medical Record Number: [id number]  Date of Birth: 15-Mar-1951 Sex: Female         Room/Bed: 4W26C/4W26C-01 Payor Info: Payor: MEDICARE / Plan: MEDICARE PART A AND B / Product Type: *No Product type* /    Admitting Diagnosis: cva  Admit Date/Time:  05/18/2018  1:35 PM Admission Comments: No comment available   Primary Diagnosis:  <principal problem not specified> Principal Problem: <principal problem not specified>  Patient Active Problem List   Diagnosis Date Noted  . Labile blood glucose   . Subtherapeutic international normalized ratio (INR)   . Hypoglycemia   . Acute ischemic right MCA stroke ([place]) 05/18/2018  . Chronic pain syndrome   . Diabetes mellitus type 2 in nonobese ([name])   . Sleep disturbance   . Delirium, drug-induced ([place])   . NSTEMI (non-ST elevated myocardial infarction) ([place]) 09/25/2013  . Coronary atherosclerosis of native coronary artery 09/25/2013  . HTN (hypertension) 09/25/2013  . Diabetes mellitus ([place]) 09/25/2013  . Tobacco abuse 09/25/2013    Expected Discharge Date: Expected Discharge Date: 05/27/18  Team Members Present: Physician leading conference: Dr. Alysia Penna Social Worker Present: Ovidio Kin, LCSW Nurse Present: Leonette Nutting, RN PT Present: Michaelene Song, PT OT Present: Roanna Epley, [place], OT SLP Present: Weston Anna, SLP PPS Coordinator present : Gunnar Fusi     Current Status/Progress Goal Weekly Team Focus  Medical   INR still subtherapeutic.  On subcu Lovenox.  Therapeutic INR, educate patient about appropriate diet  Medication management, dietary education   Bowel/Bladder   continent of bowel and bladder   manage bowel and bladder with supervision   assist with toileting PRN    Swallow/Nutrition/ Hydration             ADL's   supervision overall with min verbal  cues for attention to LUE when engaged in functional tasks; min verbal cues for safety awareness; min verbla cues for problem solving  supervision overall  safety awareness, family education, cognitive remediation, discharge planning   Mobility   supervision overall w/o AD, occasional verbal cues for safety/cognition  supervision  functional balance, cognitive remediation, endurance, discharge planning and family edu   Communication             Safety/Cognition/ Behavioral Observations  Supervision-Min A   Supervision-Min A       Pain   no pain   assess q shift   assess pain and manage PRN    Skin   Skin tear left arm tegaderm and coban for reinforcement; drsg change per order   no other skin issues; continue to monitor q shift for any new skin infection's or breakdowns  assess q shift       *See Care Plan and progress notes for long and short-term goals.     Barriers to Discharge  Current Status/Progress Possible Resolutions Date Resolved   Physician    Medical stability     Progressing towards goals making good progress with work mobility however still has left neglect  Pharmacy consult for anticoagulation.  Continue daily PT/INR      Nursing                  PT                    OT  SLP                SW                Discharge Planning/Teaching Needs:  Home with husband who does work 2-3 hours per day has someone to stay there with her during this time. Husband to be here today to go through family education.       Team Discussion:  Goals supervision for cues tends to not attend to her left side. Needs cues for this and to scan-running into things on her left. INR low but trending up MD will need an extra day for this. Dietary consult done yesterday for diet education. Will need 24 hr supervision when first goes home-husband aware of this. Family training today  Revisions to Treatment Plan:  DC 1/17    Continued Need for Acute Rehabilitation Level of  Care: The patient requires daily medical management by a physician with specialized training in physical medicine and rehabilitation for the following conditions: Daily direction of a multidisciplinary physical rehabilitation program to ensure safe treatment while eliciting the highest outcome that is of practical value to the patient.: Yes Daily medical management of patient stability for increased activity during participation in an intensive rehabilitation regime.: Yes Daily analysis of laboratory values and/or radiology reports with any subsequent need for medication adjustment of medical intervention for : Neurological problems;Other;Cardiac problems   I attest that I was present, lead the team conference, and concur with the assessment and plan of the team.   Elease Hashimoto 05/25/2018, 12:53 PM

## 2018-05-25 NOTE — Progress Notes (Signed)
Physical Therapy Session Note  Patient Details  Name: Rachel Perkins MRN: [id number] Date of Birth: [birth year]  Today's Date: 05/25/2018 PT Individual Time: 6681-5947 PT Individual Time Calculation (min): 30 min   Short Term Goals: Week 1:  PT Short Term Goal 1 (Week 1): STG = LTG due to ELOS   Skilled Therapeutic Interventions/Progress Updates: : Pt presented in w/c agreeable to therapy. Pt denies pain. Pt performed transfers throughout session supervision level. Pt ambulated to rehab gym with RW supervision and participated in foced use of LUE sorting, flipping, and placing bugs in jar. Pt able to maintain good standing balance on [employer] throughout activity and without use of RUE for support (approximately 12 min). Pt ambulated back to room in same manner as prior and performed reaching in drawers with RW while obtaining clothes for next session. Pt returned to w/c and left with belt alarm on, call bell within reach and needs met.      Therapy Documentation Precautions:  Precautions Precautions: Fall Precaution Comments: mild L inattention, mild apraxia, impulsivity  Restrictions Weight Bearing Restrictions: No    Therapy/Group: Individual Therapy  Mahesh Sizemore  Halena Mohar, PTA  05/25/2018, 2:20 PM

## 2018-05-25 NOTE — Progress Notes (Signed)
Physical Therapy Session Note  Patient Details  Name: Rachel Perkins MRN: [id number] Date of Birth: 11/19/1950  Today's Date: 05/25/2018 PT Individual Time: 7340-3709 PT Individual Time Calculation (min): 45 min   Short Term Goals: Week 1:  PT Short Term Goal 1 (Week 1): STG = LTG due to ELOS   Skilled Therapeutic Interventions/Progress Updates:    Pt seated in w/c upon PT arrival, agreeable to therapy tx and denies pain. Pt's husband present for education. Pt ambulated from room>gym with supervision/cueing from husband, therapist providing education on appropriate cueing techniques to provide as well as staying on L side. Pt ascended/descended steps this session with rail and supervision. Pt ambulated to ortho gym and performed car transfer with supervision. Pt ambulated from unit>outside x 200 ft with supervision. Pt worked on ambulation on [employer] and curb navigation with supervision and verbal cues for attention to left. Pt ambulated back to unit and used nustep x 8 minutes on workload 5 for global strengthening and endurance. Pt ambulated back to room with appropriate supervision/cues from husband. Pt left in w/c with needs in reach and chair alarm set.  Therapy Documentation Precautions:  Precautions Precautions: Fall Precaution Comments: mild L inattention, mild apraxia, impulsivity  Restrictions Weight Bearing Restrictions: No   Therapy/Group: Individual Therapy  Netta Corrigan, PT, DPT 05/25/2018, 11:24 AM

## 2018-05-26 ENCOUNTER — Inpatient Hospital Stay (HOSPITAL_COMMUNITY): Payer: Medicare Other | Admitting: Speech Pathology

## 2018-05-26 ENCOUNTER — Inpatient Hospital Stay (HOSPITAL_COMMUNITY): Payer: Self-pay | Admitting: Physical Therapy

## 2018-05-26 ENCOUNTER — Inpatient Hospital Stay (HOSPITAL_COMMUNITY): Payer: Medicare Other

## 2018-05-26 LAB — PROTIME-INR
INR: 1.86
Prothrombin Time: 21.2 seconds — ABNORMAL HIGH (ref 11.4–15.2)

## 2018-05-26 LAB — GLUCOSE, CAPILLARY
Glucose-Capillary: 107 mg/dL — ABNORMAL HIGH (ref 70–99)
Glucose-Capillary: 101 mg/dL — ABNORMAL HIGH (ref 70–99)
Glucose-Capillary: 132 mg/dL — ABNORMAL HIGH (ref 70–99)
Glucose-Capillary: 102 mg/dL — ABNORMAL HIGH (ref 70–99)

## 2018-05-26 MED ORDER — ENOXAPARIN (LOVENOX) PATIENT EDUCATION KIT
PACK | Freq: Once | Status: AC
  Administered 2018-05-26: 16:00:00
  Filled 2018-05-26: qty 1

## 2018-05-26 MED ORDER — WARFARIN SODIUM 5 MG PO TABS
5.0000 mg | ORAL_TABLET | Freq: Once | ORAL | Status: AC
  Administered 2018-05-26: 5 mg via ORAL
  Filled 2018-05-26: qty 1

## 2018-05-26 NOTE — Progress Notes (Signed)
Speech Language Pathology Discharge Summary  Patient Details  Name: Rachel Perkins MRN: [id number] Date of Birth: 02/21/1951  Today's Date: 05/26/2018 SLP Individual Time: 1345-1425 SLP Individual Time Calculation (min): 40 min   Skilled Therapeutic Interventions:  Skilled treatment session focused on cognitive goals. SLP facilitated session by re-administering the MoCA. Patient scored 24/30 points with a score of 26 or above considered normal which is 6 points higher since initial evaluation on 05/19/2018. Patient continues to demonstrate impairments in executive functioning and short-term recall. Patient verbalized understanding of results and re-educated on strategies to utilize at home to maximize cognitive functioning. She verbalized understanding. Patient left upright in recliner with alarm on. Continue with current plan of care.   Patient has met 4 of 4 long term goals.  Patient to discharge at overall Supervision;Min level.   Reasons goals not met: N/A   Clinical Impression/Discharge Summary: Patient has made excellent gains and has met 4 of 4 LTGs this admission. Currently, patient requires overall supervision level verbal cues for emergent awareness, recall and attention but requires overall Min A verbal cues for complex problem solving, especially in regards to attending to left field of environment during functional tasks. Patient and family education is complete and patient will discharge home with 24 hour supervision from family. Patient would benefit from f/u SLP services to maximize her cognitive functioning and overall functional independence in order to reduce caregiver burden.   Care Partner:  Caregiver Able to Provide Assistance: Yes  Type of Caregiver Assistance: Cognitive;Physical  Recommendation:  Home Health SLP;24 hour supervision/assistance  Rationale for SLP Follow Up: Maximize cognitive function and independence;Reduce caregiver burden   Equipment: N/A   Reasons  for discharge: Treatment goals met;Discharged from hospital   Patient/Family Agrees with Progress Made and Goals Achieved: Yes    [place], [place] 05/26/2018, 6:48 AM

## 2018-05-26 NOTE — Discharge Summary (Addendum)
Physician Discharge Summary  Patient ID: Rachel Perkins MRN: [id number] DOB/AGE: Mar 15, 1951 68 y.o.  Admit date: 05/18/2018 Discharge date: 05/27/2018  Discharge Diagnoses:  Principal Problem:   Acute ischemic right MCA stroke [facility]) Active Problems:   Diabetes mellitus ([place])   Chronic pain syndrome   Diabetes mellitus type 2 in nonobese [facility])   Sleep disturbance   Subtherapeutic international normalized ratio (INR)   Discharged Condition: stable   Significant Diagnostic Studies: No results found.  Labs:  Basic Metabolic Panel: BMP Latest Ref Rng & Units 05/23/2018 05/19/2018 09/26/2013  Glucose 70 - 99 mg/dL 118(H) 111(H) 210(H)  BUN 8 - 23 mg/dL 24(H) 15 15  Creatinine 0.44 - 1.00 mg/dL 0.93 0.90 0.83  Sodium 135 - 145 mmol/L 137 137 142  Potassium 3.5 - 5.1 mmol/L 3.6 4.2 3.7  Chloride 98 - 111 mmol/L 105 105 106  CO2 22 - 32 mmol/L 24 23 23   Calcium 8.9 - 10.3 mg/dL 8.9 9.4 8.8    CBC: Recent Labs  Lab 05/21/18 0530 05/24/18 0539 05/27/18 0540  WBC 7.3 6.8 6.8  HGB 14.2 12.9 13.2  HCT 41.5 39.3 40.6  MCV 94.3 95.6 95.5  PLT 222 214 253    CBG: Recent Labs  Lab 05/26/18 0710 05/26/18 1132 05/26/18 1648 05/26/18 2141 05/27/18 0617  GLUCAP 107* 101* 132* 102* 89    Lab Results  Component Value Date   INR 1.88 05/27/2018   INR 1.86 05/26/2018   INR 1.89 05/25/2018    Brief HPI:   Rachel Perkins is a 68 year old RH female with history of CAD, T2DM, chronic pain; who was admitted to [facility] health 05/11/2018 via [facility] with left upper extremity weakness and numbness due to right MCA distribution infarct and right MCA M2 branch occlusion.  Patient not felt to be a candidate for thrombectomy due to low NIHSS. MRI brain done revealing a large acute infarct in right MCA distribution and small acute infarcts in left frontal and right parietal lobes.    Neurology felt the stroke was cardioembolic and recommended 54-YTK heart monitor to rule out A. Fib. TEE  done revealing EF of 65 to 70% and small mobile mass on aortic valve concerning for fibroelastioma v/s atheroma v/s thrombus or endocarditis.  Blood cultures drawn and were negative. She was started on IV heparin for treatment.    Repeat CT head was negative for bleed and she was transitioned to Lovenox/Coumadin bridge.  Dr. Adonis Housekeeper CT surgery recommended resection of mass post discharge from rehab.  She did have mild clonus type activity --EEG negative as well as issues with delirium.  Oxycodone and gabapentin were discontinued and she was started on low-dose Seroquel with improvement in sleep-wake disruption.  Patient continued to be limited by left upper extremity ataxia, left sensory deficits and left inattention.  Therapy ongoing and recommended CIR level therapy.  Chart reviewed by rehab MD and case manager and patient felt to be a good CIR candidate.    Hospital Course: Rachel Perkins was admitted to rehab 05/18/2018 for inpatient therapies to consist of PT, ST and OT at least three hours five days a week. Past admission physiatrist, therapy team and rehab RN have worked together to provide customized collaborative inpatient rehab.  She was maintained on Coumadin with Lovenox bridge and pharmacy has assisted in management and dosing of Coumadin.  INR is slowly trending up on 5 mg dialy and she continues on Lovenox twice daily at discharge.  Next  pro time to be rechecked on 1/20 at Dr. Trena Platt office.  Follow-up CBC shows H&H is stable.  Check of lytes shows prerenal azotemia and patient was encouraged to increase fluid intake.   Chronic anterior shoulder pain has been treated with use of Voltaren gel qid times daily as well as local measures.  Pain has been controlled with as needed use Tylenol.  Blood pressures have been monitored on a twice daily basis and has been controlled on low-dose metoprolol twice daily.  Diabetes has been monitored with AC at bedtime CBG checks and blood sugars have been  reasonably controlled on metformin daily.  Melatonin was used to help manage insomnia.  Mood has been stable on low-dose Seroquel twice daily.  She has made steady progress during her rehab stay but continues to require mod to max verbal cues to attend to left field and to left upper extremity due to decreased sensation.  She will continue to receive further follow-up home health PT, OT, ST and RN by [facility] after discharge.    Rehab course: During patient's stay in rehab team conference was held to monitor patient's progress, set goals and discuss barriers to discharge. At admission, patient required min assist with mobility and basic self care tasks.  She exhibited mild to moderate cognitive deficits with MoCA score 18/30. She has had improvement in activity tolerance, balance, postural control as well as ability to compensate for deficits. He/She has had improvement in functional use LUE  and LLE as well as improvement in awareness. She is able to take complete ADL tasks with supervision.  She is modified independent for transfers and is ambulating 150 feet with verbal cues for safety.  She requires supervision for emergent awareness and recall and min verbal cues for complex problem-solving in regards to attending to left field during functional tasks. MoCA score has improved to 24/30.  Family education was completed with husband regarding all aspects of care.     Disposition: Home  Diet: Heart Healthy.   Special Instructions: 1. Needs protime rechecked 05/31/18. INR goal 2-3 range. Discontinue Lovenox once INR > 2.0 X 2 days.  2. Repeat CMET for follow up on LFTs and renal status.  3. Encourage fluid intake.  4. NO driving till cleared by Neurology or psychiatrist.    Discharge Instructions    Ambulatory referral to Physical Medicine Rehab   Complete by:  As directed    1-2 weeks transitional care appt    Allergies as of 05/27/2018      Reactions   Codeine Itching   Penicillins  Rash   DID THE REACTION INVOLVE: Swelling of the face/tongue/throat, SOB, or low BP? Unknown Sudden or severe rash/hives, skin peeling, or the inside of the mouth or nose? Unknown Did it require medical treatment? Unknown When did it last happen?unk If all above answers are "NO", may proceed with cephalosporin use.   Sulfa Antibiotics Rash   Lisinopril Cough   Other Other (See Comments)   NUTS ?LOBSTER unknown      Medication List    STOP taking these medications   atorvastatin 80 MG tablet Commonly known as:  LIPITOR   calcium acetate 667 MG capsule Commonly known as:  PHOSLO   docusate sodium 100 MG capsule Commonly known as:  COLACE   ziprasidone injection Commonly known as:  GEODON     TAKE these medications   acetaminophen 325 MG tablet Commonly known as:  TYLENOL Take 1-2 tablets (325-650 mg total) by  mouth every 4 (four) hours as needed for mild pain.   aspirin EC 81 MG tablet Take 81 mg by mouth daily.   augmented betamethasone dipropionate 0.05 % cream Commonly known as:  DIPROLENE-AF Apply 1 application topically 2 (two) times daily as needed (skin irritation).   calcium-vitamin D 500-200 MG-UNIT tablet Commonly known as:  OSCAL WITH D Take 1 tablet by mouth daily with breakfast.   diclofenac sodium 1 % Gel Commonly known as:  VOLTAREN Apply 2 g topically 3 (three) times daily with meals.   enoxaparin 60 MG/0.6ML injection Commonly known as:  LOVENOX Inject 0.6 mLs (60 mg total) into the skin every 12 (twelve) hours. What changed:  additional instructions   Melatonin 1 MG Tabs Take 2 mg by mouth at bedtime. Notes to patient:  Over the counter   metFORMIN 500 MG 24 hr tablet Commonly known as:  GLUCOPHAGE-XR Take 1 tablet (500 mg total) by mouth daily.   metoprolol tartrate 25 MG tablet Commonly known as:  LOPRESSOR Take 1 tablet (25 mg total) by mouth 2 (two) times daily.   MUSCLE RUB 10-15 % Crea Apply 1 application topically 3  (three) times daily as needed for muscle pain.   nicotine 21 mg/24hr patch Commonly known as:  NICODERM CQ - dosed in mg/24 hours Place 1 patch (21 mg total) onto the skin daily. Notes to patient:  Over the counter   nitroGLYCERIN 0.4 MG SL tablet Commonly known as:  NITROSTAT PLACE 1 TABLET UNDER THE TONGUE EVERY 5 MINUTES FOR 3 DOSES AS NEEDED CHEST PAIN   QUEtiapine 25 MG tablet Commonly known as:  SEROQUEL Take 1 tablet (25 mg total) by mouth 2 (two) times daily. Notes to patient:  For anxiety/mood   warfarin 5 MG tablet Commonly known as:  COUMADIN Take 1 tablet (5 mg total) by mouth daily at 6 PM.      Follow-up Information    Kirsteins, Luanna Salk, MD Follow up.   Specialty:  Physical Medicine and Rehabilitation Why:  Office will call you with follow up appointment Contact information: [address] [place] [address] [phone]        Grace Isaac, MD. Call in 1 day(s).   Specialty:  Cardiothoracic Surgery Why:  for follow up appointment Contact information: [place] [address] [phone]        Monico Blitz, MD Follow up on 05/30/2018.   Specialty:  Internal Medicine Why:  call for time Contact information: [address]  [place] [address] [phone]           Signed: Bary Leriche 05/27/2018, 5:43 PM

## 2018-05-26 NOTE — Plan of Care (Signed)
  Problem: RH BOWEL ELIMINATION Goal: RH STG MANAGE BOWEL WITH ASSISTANCE Description STG Manage Bowel with min Assistance.  Outcome: Progressing Flowsheets (Taken 05/26/2018 1430) STG: Pt will manage bowels with assistance: 5-Supervision/curing Goal: RH STG MANAGE BOWEL W/MEDICATION W/ASSISTANCE Description STG Manage Bowel with Medication with min Assistance.  Outcome: Progressing Flowsheets (Taken 05/26/2018 1430) STG: Pt will manage bowels with medication with assistance: 5-Supervision/set up   Problem: RH SKIN INTEGRITY Goal: RH STG SKIN FREE OF INFECTION/BREAKDOWN Description Will have no acquired skin break down in 14 days  Outcome: Progressing   Problem: RH SAFETY Goal: RH STG DECREASED RISK OF FALL WITH ASSISTANCE Description STG Decreased Risk of Fall With min Assistance.  Outcome: Progressing   Problem: RH COGNITION-NURSING Goal: RH STG USES MEMORY AIDS/STRATEGIES W/ASSIST TO PROBLEM SOLVE Description STG Uses Memory Aids/Strategies With mod I Assistance to Problem Solve.  Outcome: Progressing Goal: RH STG ANTICIPATES NEEDS/CALLS FOR ASSIST W/ASSIST/CUES Description STG Anticipates Needs/Calls for Assist With mod I Assistance/Cues.  Outcome: Progressing   Problem: RH PAIN MANAGEMENT Goal: RH STG PAIN MANAGED AT OR BELOW PT'S PAIN GOAL Description Pain scale <4/10  Outcome: Progressing   Problem: RH KNOWLEDGE DEFICIT Goal: RH STG INCREASE KNOWLEDGE OF DIABETES Outcome: Progressing Goal: RH STG INCREASE KNOWLEDGE OF HYPERTENSION Outcome: Progressing Goal: RH STG INCREASE KNOWLEGDE OF HYPERLIPIDEMIA Outcome: Progressing Goal: RH STG INCREASE KNOWLEDGE OF STROKE PROPHYLAXIS Outcome: Progressing

## 2018-05-26 NOTE — Progress Notes (Signed)
Occupational Therapy Session Note  Patient Details  Name: Rachel Perkins MRN: [id number] Date of Birth: August 06, 1950  Today's Date: 05/26/2018 OT Individual Time: 1430-1500 OT Individual Time Calculation (min): 30 min    Short Term Goals: Week 1:  OT Short Term Goal 1 (Week 1): STG = LTGs due to ELOS  Skilled Therapeutic Interventions/Progress Updates:    1:1. Pt received in recliner with all BADL needs addressed. Pt ambulates with no AD to/from all tx spaces with MOD I. Pt uses long handled equipement with education on energy conservation to sweep small room with min VC for attending to all dirt on floor. Pt completes 5 min NUstep on level 5 with rpm greater than 75. Exited session with pt seated in recliner with exit alamr on  Therapy Documentation Precautions:  Precautions Precautions: Fall Precaution Comments: mild L inattention, mild apraxia, impulsivity  Restrictions Weight Bearing Restrictions: No General:   Vital Signs: Therapy Vitals Temp: 97.7 F (36.5 C) Temp Source: Oral Pulse Rate: 84 Resp: 16 BP: (!) 135/98 Patient Position (if appropriate): Sitting Oxygen Therapy SpO2: 99 % O2 Device: Room Air Pain:   ADL: ADL Grooming: Minimal assistance Where Assessed-Grooming: Standing at sink Upper Body Bathing: Minimal cueing, Minimal assistance Where Assessed-Upper Body Bathing: Shower Lower Body Bathing: Minimal assistance, Minimal cueing Where Assessed-Lower Body Bathing: Shower Upper Body Dressing: Minimal assistance, Minimal cueing Where Assessed-Upper Body Dressing: Edge of bed Lower Body Dressing: Minimal assistance, Minimal cueing Where Assessed-Lower Body Dressing: Edge of bed Toileting: Minimal assistance Where Assessed-Toileting: Glass blower/designer: Psychiatric nurse Method: Counselling psychologist: Energy manager: Minimal cueing, Minimal assistance Social research officer, government Method:  Heritage manager: [employer], Civil engineer, contracting with back Glass blower/designer Praxis: Impaired Praxis Impairment Details: Surveyor, minerals Comments: mild apraxia remains, improved since eval Exercises:   Other Treatments:     Therapy/Group: Individual Therapy  Tonny Branch 05/26/2018, 3:01 PM

## 2018-05-26 NOTE — Progress Notes (Signed)
[place] for Warfarin and Enoxaparin Indication: stroke & concern for R-mobile thrombus  Allergies  Allergen Reactions  . Codeine Itching  . Penicillins Rash    DID THE REACTION INVOLVE: Swelling of the face/tongue/throat, SOB, or low BP? Unknown Sudden or severe rash/hives, skin peeling, or the inside of the mouth or nose? Unknown Did it require medical treatment? Unknown When did it last happen?unk If all above answers are "NO", may proceed with cephalosporin use.   . Sulfa Antibiotics Rash  . Lisinopril Cough  . Other Other (See Comments)    NUTS ?LOBSTER unknown   Patient Measurements: Height: 5\' 3"  (160 cm) Weight: 135 lb 11.2 oz (61.6 kg) IBW/kg (Calculated) : 52.4 Ht: 5'4" Wt: 64.8 kg  Vital Signs: Temp: 97.8 F (36.6 C) (01/16 0708) Temp Source: Oral (01/16 0708) BP: 122/82 (01/16 0708) Pulse Rate: 73 (01/16 0708)  Labs: Recent Labs    05/24/18 0539 05/25/18 0516 05/26/18 0724  HGB 12.9  --   --   HCT 39.3  --   --   PLT 214  --   --   LABPROT 19.7* 21.4* 21.2*  INR 1.70 1.89 1.86   Estimated Creatinine Clearance: 47.2 mL/min (by C-G formula based on SCr of 0.93 mg/dL).  Assessment: 65 YOF transferred to CIR from [place] on 1/8 for rehab s/p recent CVA on 1/1. Warf for new CVA, R-heart mobile lesion/thrombus. Was started at [facility] before transferring to Rehab. INR remains subtherapeutic but close at 1.86. CBC stable. Plan to cover low INR with Lovenox until therapeutic.  Goal of Therapy:  INR 2-3 Monitor platelets by anticoagulation protocol: Yes   Plan:  Give Coumadin 5mg  PO x 1 Continue enoxaparin 60mg  [place] Q12h until INR therapeutic Monitor daily INR, CBC, s/s of bleed  May discharge today Would recommend discharging on Coumadin 5mg  PO daily  Elenor Quinones, PharmD, [employer], [facility] Clinical Pharmacist Phone number [phone] 05/26/2018 8:34 AM

## 2018-05-26 NOTE — Progress Notes (Signed)
[facility] PHYSICAL MEDICINE & REHABILITATION PROGRESS NOTE   Subjective/Complaints: Mobile R ventricular mass, discussed need for removal , cardiothoracic surgery follow up at [facility] slightlly sub therapeutic ,per Pharm rec d/c home on Warfarin 5mg   ROS: Denies CP, SOB, N/V/D  Objective:   No results found. Recent Labs    05/24/18 0539  WBC 6.8  HGB 12.9  HCT 39.3  PLT 214   No results for input(s): NA, K, CL, CO2, GLUCOSE, BUN, CREATININE, CALCIUM in the last 72 hours.  Intake/Output Summary (Last 24 hours) at 05/26/2018 0858 Last data filed at 05/25/2018 1307 Gross per 24 hour  Intake 480 ml  Output -  Net 480 ml     Physical Exam: Vital Signs Blood pressure 122/82, pulse 73, temperature 97.8 F (36.6 C), temperature source Oral, resp. rate 16, height 5\' 3"  (1.6 m), weight 61.6 kg, SpO2 99 %.   Constitutional: No distress . Vital signs reviewed. HENT: Normocephalic.  Atraumatic. Eyes: EOMI. No discharge. Cardiovascular: RRR.  No JVD. Respiratory: CTA bilaterally.  Normal effort. GI: BS +. Non-distended. Musc: No edema or tenderness in extremities. Neurologic: Alert, mild dysarthria, + left neglect Motor: Grossly 5/5 throughout with LUE ataxia, stable Psych: Normal mood.  Normal affect.  Assessment/Plan: 1. Functional deficits secondary to RIght MCA infarct which require 3+ hours per day of interdisciplinary therapy in a comprehensive inpatient rehab setting.  Physiatrist is providing close team supervision and 24 hour management of active medical problems listed below.  Physiatrist and rehab team continue to assess barriers to discharge/monitor patient progress toward functional and medical goals  Care Tool:  Bathing    Body parts bathed by patient: Right arm, Left arm, Chest, Abdomen, Front perineal area, Buttocks, Right upper leg, Left upper leg, Right lower leg, Left lower leg, Face         Bathing assist Assist Level:  Supervision/Verbal cueing     Upper Body Dressing/Undressing Upper body dressing   What is the patient wearing?: Pull over shirt, Bra    Upper body assist Assist Level: Minimal Assistance - Patient > 75%    Lower Body Dressing/Undressing Lower body dressing      What is the patient wearing?: Underwear/pull up, Pants     Lower body assist Assist for lower body dressing: Supervision/Verbal cueing     Toileting Toileting    Toileting assist Assist for toileting: Supervision/Verbal cueing     Transfers Chair/bed transfer  Transfers assist     Chair/bed transfer assist level: Independent     Locomotion Ambulation   Ambulation assist      Assist level: Supervision/Verbal cueing Assistive device: Other (comment)(none) Max distance: 150'   Walk 10 feet activity   Assist     Assist level: Supervision/Verbal cueing Assistive device: Other (comment)(none)   Walk 50 feet activity   Assist    Assist level: Supervision/Verbal cueing Assistive device: Other (comment)(none)    Walk 150 feet activity   Assist    Assist level: Supervision/Verbal cueing Assistive device: Other (comment)(none)    Walk 10 feet on uneven surface  activity   Assist     Assist level: Supervision/Verbal cueing Assistive device: Other (comment)(none)   Wheelchair     Assist Will patient use wheelchair at discharge?: No   Wheelchair activity did not occur: N/A         Wheelchair 50 feet with 2 turns activity    Assist    Wheelchair 50 feet with 2 turns activity did not  occur: N/A       Wheelchair 150 feet activity     Assist Wheelchair 150 feet activity did not occur: N/A        Medical Problem List and Plan: 1.  Limitations with mobility, safety, self-care, cognitive deficits secondary to right MCA infarct.  Continue CIR, 2.  Fibroelastoma aortic valve/DVT Prophylaxis/Anticoagulation: Pharmaceutical: Coumadin  INR subtherapeutic on 1/12,  1/13 after being therapeutic. On Lovenox 60mg  sq BID INR climbing slightly to 1.7   Appreciate pharmacy Recs- will need to push back D/C date to 1/17 3. Chronic back pain/Pain Management: Off gabapentin and oxycodone due to myoclonus.  4. Mood: LCSW to follow for evaluation and support.  5. Neuropsych: This patient is capable of making decisions on her own behalf. 6. Skin/Wound Care: Routine pressure relief measures.  7. Fluids/Electrolytes/Nutrition: Monitor I/O.   8. HTN: Monitor BP bid--continue metoprolol bid. Vitals:   05/25/18 2016 05/26/18 0708  BP: (!) 144/68 122/82  Pulse: 79 73  Resp:  16  Temp:  97.8 F (36.6 C)  SpO2:  99%   Controlled 1/16 9. T2DM: Hgb A1c-7.0. Continue metformin daily. Monitor BS ac/hs.  CBG (last 3)  Recent Labs    05/25/18 1651 05/25/18 2142 05/26/18 0710  GLUCAP 109* 88 107*   Controlled 1/16 10.  Sleep disturbance: Continue melatonin at nights. sleep wake chart 11. Delirium: Continue Seroquel bid for now.   12.  Fibroelastoma aortic valve- outpt CVTS f/u for removal  LOS: 8 days A FACE TO FACE EVALUATION WAS PERFORMED  Charlett Blake 05/26/2018, 8:58 AM

## 2018-05-26 NOTE — Progress Notes (Signed)
Social Work Patient ID: Rachel Perkins, female   DOB: 12/07/1950, 68 y.o.   MRN: [id number] Family education went well with husband yesterday and he had his questions answered. He is aware of her discharge tomorrow. Will work toward this.

## 2018-05-27 LAB — CBC
HCT: 40.6 % (ref 36.0–46.0)
Hemoglobin: 13.2 g/dL (ref 12.0–15.0)
MCH: 31.1 pg (ref 26.0–34.0)
MCHC: 32.5 g/dL (ref 30.0–36.0)
MCV: 95.5 fL (ref 80.0–100.0)
Platelets: 253 10*3/uL (ref 150–400)
RBC: 4.25 MIL/uL (ref 3.87–5.11)
RDW: 13.6 % (ref 11.5–15.5)
WBC: 6.8 10*3/uL (ref 4.0–10.5)
nRBC: 0 % (ref 0.0–0.2)

## 2018-05-27 LAB — GLUCOSE, CAPILLARY: Glucose-Capillary: 89 mg/dL (ref 70–99)

## 2018-05-27 LAB — PROTIME-INR
INR: 1.88
Prothrombin Time: 21.4 seconds — ABNORMAL HIGH (ref 11.4–15.2)

## 2018-05-27 MED ORDER — NICOTINE 21 MG/24HR TD PT24: 21.0000 mg | MEDICATED_PATCH | Freq: Every day | TRANSDERMAL | 0 refills | Status: DC

## 2018-05-27 MED ORDER — WARFARIN SODIUM 5 MG PO TABS: 5.0000 mg | ORAL_TABLET | Freq: Once | ORAL | Status: DC

## 2018-05-27 MED ORDER — METOPROLOL TARTRATE 25 MG PO TABS: 25.0000 mg | ORAL_TABLET | Freq: Two times a day (BID) | ORAL | 0 refills | Status: AC

## 2018-05-27 MED ORDER — ENOXAPARIN SODIUM 60 MG/0.6ML ~~LOC~~ SOLN: 1.0000 mg/kg | Freq: Two times a day (BID) | SUBCUTANEOUS | 1 refills | Status: DC

## 2018-05-27 MED ORDER — WARFARIN SODIUM 5 MG PO TABS: 5.0000 mg | ORAL_TABLET | Freq: Every day | ORAL | 0 refills | Status: AC

## 2018-05-27 MED ORDER — QUETIAPINE FUMARATE 25 MG PO TABS: 25.0000 mg | ORAL_TABLET | Freq: Two times a day (BID) | ORAL | 0 refills | Status: DC

## 2018-05-27 MED ORDER — DICLOFENAC SODIUM 1 % TD GEL: 2.0000 g | Freq: Three times a day (TID) | TRANSDERMAL | 0 refills | Status: DC

## 2018-05-27 MED ORDER — METFORMIN HCL ER 500 MG PO TB24: 500.0000 mg | ORAL_TABLET | Freq: Every day | ORAL | 0 refills | Status: DC

## 2018-05-27 MED ORDER — MUSCLE RUB 10-15 % EX CREA: 1.0000 "application " | TOPICAL_CREAM | Freq: Three times a day (TID) | CUTANEOUS | 0 refills | Status: DC | PRN

## 2018-05-27 NOTE — Discharge Instructions (Signed)
Inpatient Rehab Discharge Instructions  Rachel Perkins Discharge date and time:  05/27/18  Activities/Precautions/ Functional Status: Activity: no lifting, driving, or strenuous exercise till cleared by MD Diet: cardiac diet and diabetic diet Wound Care: none needed    Functional status:  ___ No restrictions     ___ Walk up steps independently _X__ 24/7 supervision/assistance   ___ Walk up steps with assistance ___ Intermittent supervision/assistance  ___ Bathe/dress independently ___ Walk with walker     ___ Bathe/dress with assistance ___ Walk Independently    ___ Shower independently ___ Walk with assistance    _X__ Shower with assistance _X__ No alcohol     ___ Return to work/school ________    Special Instructions: 1. Need to follow up with neurology and cardiology at [facility] after discharge.    COMMUNITY REFERRALS UPON DISCHARGE:    Home Health:   PT, OT, SP , RN  [facility] Phone:[phone]   Date of last service:05/26/2018  Medical Equipment/Items Ordered:GET ON OWN-PATIENT AND HUSBAND    STROKE/TIA DISCHARGE INSTRUCTIONS SMOKING Cigarette smoking nearly doubles your risk of having a stroke & is the single most alterable risk factor  If you smoke or have smoked in the last 12 months, you are advised to quit smoking for your health.  Most of the excess cardiovascular risk related to smoking disappears within a year of stopping.  Ask you doctor about anti-smoking medications  [place] Quit Line: 1-800-QUIT NOW  Free Smoking Cessation Classes [phone]  CHOLESTEROL Know your levels; limit fat & cholesterol in your diet  Lipid Panel  No results found for: CHOL, TRIG, HDL, CHOLHDL, VLDL, LDLCALC    Many patients benefit from treatment even if their cholesterol is at goal.  Goal: Total Cholesterol (CHOL) less than 160  Goal:  Triglycerides (TRIG) less than 150  Goal:  HDL greater than 40  Goal:  LDL (LDLCALC) less than 100   BLOOD PRESSURE  American Stroke Association blood pressure target is less that 120/80 mm/Hg  Your discharge blood pressure is:  BP: 134/76  Monitor your blood pressure  Limit your salt and alcohol intake  Many individuals will require more than one medication for high blood pressure  DIABETES (A1c is a blood sugar average for last 3 months) Goal HGBA1c is under 7% (HBGA1c is blood sugar average for last 3 months)  Diabetes:     HGBA1C- 7.0   Your HGBA1c can be lowered with medications, healthy diet, and exercise.  Check your blood sugar as directed by your physician  Call your physician if you experience unexplained or low blood sugars.  PHYSICAL ACTIVITY/REHABILITATION Goal is 30 minutes at least 4 days per week  Activity: No driving, Therapies: see above Return to work: to be decided on follow up.   Activity decreases your risk of heart attack and stroke and makes your heart stronger.  It helps control your weight and blood pressure; helps you relax and can improve your mood.  Participate in a regular exercise program.  Talk with your doctor about the best form of exercise for you (dancing, walking, swimming, cycling).  DIET/WEIGHT Goal is to maintain a healthy weight  Your discharge diet is:  Diet Order            Diet heart healthy/carb modified Room service appropriate? Yes; Fluid consistency: Thin  Diet effective now             liquids Your height is:  Height: 5\' 3"  (160 cm) Your  current weight is: Weight: 61.6 kg Your Body Mass Index (BMI) is:  BMI (Calculated): 24.06  Following the type of diet specifically designed for you will help prevent another stroke.  You are at goal weight.   Your goal Body Mass Index (BMI) is 19-24.  Healthy food habits can help reduce 3 risk factors for stroke:  High cholesterol, hypertension, and excess weight.  RESOURCES Stroke/Support Group:  Call [phone]   STROKE EDUCATION PROVIDED/REVIEWED AND GIVEN TO PATIENT Stroke warning signs and  symptoms How to activate emergency medical system (call 911). Medications prescribed at discharge. Need for follow-up after discharge. Personal risk factors for stroke. Pneumonia vaccine given:  Flu vaccine given:  My questions have been answered, the writing is legible, and I understand these instructions.  I will adhere to these goals & educational materials that have been provided to me after my discharge from the hospital.     My questions have been answered and I understand these instructions. I will adhere to these goals and the provided educational materials after my discharge from the hospital.  Patient/Caregiver Signature _______________________________ Date __________  Clinician Signature _______________________________________ Date __________  Please bring this form and your medication list with you to all your follow-up doctor's appointments. Information on my medicine - Coumadin   (Warfarin)  Why was Coumadin prescribed for you? Coumadin was prescribed for you because you have a blood clot or a medical condition that can cause an increased risk of forming blood clots. Blood clots can cause serious health problems by blocking the flow of blood to the heart, lung, or brain. Coumadin can prevent harmful blood clots from forming. As a reminder your indication for Coumadin is: Right heart mobile lesion/thrombus and recent stroke  What test will check on my response to Coumadin? While on Coumadin (warfarin) you will need to have an INR test regularly to ensure that your dose is keeping you in the desired range. The INR (international normalized ratio) number is calculated from the result of the laboratory test called prothrombin time (PT).  If an INR APPOINTMENT HAS NOT ALREADY BEEN MADE FOR YOU please schedule an appointment to have this lab work done by your health care provider within 7 days. Your INR goal is usually a number between:  2 to 3 or your provider may give you a more  narrow range like 2-2.5.  Ask your health care provider during an office visit what your goal INR is.  What  do you need to  know  About  COUMADIN? Take Coumadin (warfarin) exactly as prescribed by your healthcare provider about the same time each day.  DO NOT stop taking without talking to the doctor who prescribed the medication.  Stopping without other blood clot prevention medication to take the place of Coumadin may increase your risk of developing a new clot or stroke.  Get refills before you run out.  What do you do if you miss a dose? If you miss a dose, take it as soon as you remember on the same day then continue your regularly scheduled regimen the next day.  Do not take two doses of Coumadin at the same time.  Important Safety Information A possible side effect of Coumadin (Warfarin) is an increased risk of bleeding. You should call your healthcare provider right away if you experience any of the following: ? Bleeding from an injury or your nose that does not stop. ? Unusual colored urine (red or dark brown) or unusual colored stools (red  or black). ? Unusual bruising for unknown reasons. ? A serious fall or if you hit your head (even if there is no bleeding).  Some foods or medicines interact with Coumadin (warfarin) and might alter your response to warfarin. To help avoid this: ? Eat a balanced diet, maintaining a consistent amount of Vitamin K. ? Notify your provider about major diet changes you plan to make. ? Avoid alcohol or limit your intake to 1 drink for women and 2 drinks for men per day. (1 drink is 5 oz. wine, 12 oz. beer, or 1.5 oz. liquor.)  Make sure that ANY health care provider who prescribes medication for you knows that you are taking Coumadin (warfarin).  Also make sure the healthcare provider who is monitoring your Coumadin knows when you have started a new medication including herbals and non-prescription products.  Coumadin (Warfarin)  Major Drug  Interactions  Increased Warfarin Effect Decreased Warfarin Effect  Alcohol (large quantities) Antibiotics (esp. Septra/Bactrim, Flagyl, Cipro) Amiodarone (Cordarone) Aspirin (ASA) Cimetidine (Tagamet) Megestrol (Megace) NSAIDs (ibuprofen, naproxen, etc.) Piroxicam (Feldene) Propafenone (Rythmol SR) Propranolol (Inderal) Isoniazid (INH) Posaconazole (Noxafil) Barbiturates (Phenobarbital) Carbamazepine (Tegretol) Chlordiazepoxide (Librium) Cholestyramine (Questran) Griseofulvin Oral Contraceptives Rifampin Sucralfate (Carafate) Vitamin K   Coumadin (Warfarin) Major Herbal Interactions  Increased Warfarin Effect Decreased Warfarin Effect  Garlic Ginseng Ginkgo biloba Coenzyme Q10 Green tea St. Johns wort    Coumadin (Warfarin) FOOD Interactions  Eat a consistent number of servings per week of foods HIGH in Vitamin K (1 serving =  cup)  Collards (cooked, or boiled & drained) Kale (cooked, or boiled & drained) Mustard greens (cooked, or boiled & drained) Parsley *serving size only =  cup Spinach (cooked, or boiled & drained) Swiss chard (cooked, or boiled & drained) Turnip greens (cooked, or boiled & drained)  Eat a consistent number of servings per week of foods MEDIUM-HIGH in Vitamin K (1 serving = 1 cup)  Asparagus (cooked, or boiled & drained) Broccoli (cooked, boiled & drained, or raw & chopped) Brussel sprouts (cooked, or boiled & drained) *serving size only =  cup Lettuce, raw (green leaf, endive, romaine) Spinach, raw Turnip greens, raw & chopped   These websites have more information on Coumadin (warfarin):  FailFactory.se; VeganReport.com.au;

## 2018-05-27 NOTE — Progress Notes (Signed)
[facility] for Warfarin and Enoxaparin Indication: stroke & concern for R-mobile thrombus  Allergies  Allergen Reactions  . Codeine Itching  . Penicillins Rash    DID THE REACTION INVOLVE: Swelling of the face/tongue/throat, SOB, or low BP? Unknown Sudden or severe rash/hives, skin peeling, or the inside of the mouth or nose? Unknown Did it require medical treatment? Unknown When did it last happen?unk If all above answers are "NO", may proceed with cephalosporin use.   . Sulfa Antibiotics Rash  . Lisinopril Cough  . Other Other (See Comments)    NUTS ?LOBSTER unknown   Patient Measurements: Height: 5\' 3"  (160 cm) Weight: 135 lb 11.2 oz (61.6 kg) IBW/kg (Calculated) : 52.4 Ht: 5'4" Wt: 64.8 kg  Vital Signs: Temp: 97.7 F (36.5 C) (01/17 0535) Temp Source: Oral (01/17 0535) BP: 141/92 (01/17 0535) Pulse Rate: 72 (01/17 0535)  Labs: Recent Labs    05/25/18 0516 05/26/18 0724 05/27/18 0540  HGB  --   --  13.2  HCT  --   --  40.6  PLT  --   --  253  LABPROT 21.4* 21.2* 21.4*  INR 1.89 1.86 1.88   Estimated Creatinine Clearance: 47.2 mL/min (by C-G formula based on SCr of 0.93 mg/dL).  Assessment: 49 YOF transferred to CIR from [place] on 1/8 for rehab s/p recent CVA on 1/1. Warfarin for new CVA, R-heart mobile lesion/thrombus. Was started at [facility] before transferring to Rehab. INR remains subtherapeutic at 1.88. CBC stable. Plan to cover low INR with Lovenox until therapeutic.  Goal of Therapy:  INR 2-3 Monitor platelets by anticoagulation protocol: Yes   Plan:  Give Coumadin 5mg  PO x 1 Continue enoxaparin 60mg  Backus Q12h until INR therapeutic Monitor daily INR, CBC, s/s of bleed  May discharge today Would recommend discharging on Coumadin 5mg  PO daily  Elenor Quinones, PharmD, [place], [facility] Clinical Pharmacist Phone number [phone] 05/27/2018 7:45 AM

## 2018-05-27 NOTE — Progress Notes (Signed)
Social Work  Discharge Note  The overall goal for the admission was met for:   Discharge location: Yes-HOME WITH HUSBAND-HE HAS ARRANGED 24 HR SUPERVISION FOR PT WHEN HE IS WORKING  Length of Stay: Yes-9 DAYS  Discharge activity level: Yes-SUPERVISION LEVEL  Home/community participation: Yes  Services provided included: MD, RD, PT, OT, SLP, RN, CM, Pharmacy and SW  Financial Services: Medicare and Private Insurance: [place]  Follow-up services arranged: Home Health: [facility]-PT,OT,SP,RN and Patient/Family has no preference for HH/DME agencies  Comments (or additional information):HUSBAND WAS HERE FOR EDUCATOON AWARE PT WILL NEED 24 HR SUPERVISION AT DC. PLAN TO GET TUB SEAT ON THEIR OWN  Patient/Family verbalized understanding of follow-up arrangements: Yes  Individual responsible for coordination of the follow-up plan: ROBERT-HUSBAND AND PT  Confirmed correct DME delivered: Elease Hashimoto 05/27/2018    Elease Hashimoto

## 2018-05-27 NOTE — Progress Notes (Signed)
Occupational Therapy Discharge Summary  Patient Details  Name: Rachel Perkins MRN: [id number] Date of Birth: 10/21/1950  Patient has met 1 of 79 long term goals due to improved activity tolerance, improved balance, postural control, ability to compensate for deficits, functional use of  LEFT upper and LEFT lower extremity, improved attention, improved awareness and improved coordination.  Pt made steady progress with BADLs and IADLS during this admission.  Pt completes all tasks at supervision level.  Pt will require 24 hour supervision at discharge.  Pt continues to exhibit L inattention with LUE decreased sensation.  Pt requires mod/max verbal cues to attend to her L during functional tasks.  Pt's husband has been present and participated in therapy sessions and has demonstrated appropriate level of supervision/assistance. Patient to discharge at overall Supervision level.  Patient's care partner is independent to provide the necessary cognitive assistance at discharge.     Recommendation:  Patient will benefit from ongoing skilled OT services in home health setting to continue to advance functional skills in the area of BADL and iADL.  Equipment: No equipment provided Pt will purchase tub seat  Reasons for discharge: treatment goals met  Patient/family agrees with progress made and goals achieved: Yes  OT Discharge Vision Baseline Vision/History: No visual deficits Patient Visual Report: No change from baseline Vision Assessment?: No apparent visual deficits Perception  Perception: Within Functional Limits Praxis Praxis: Impaired Praxis Impairment Details: Motor planning Cognition Overall Cognitive Status: Impaired/Different from baseline Arousal/Alertness: Awake/alert Attention: Selective Selective Attention: Appears intact Memory: Appears intact Awareness Impairment: Emergent impairment Problem Solving: Impaired Safety/Judgment: Impaired Comments: decreased safety  awareness, improved since eval Sensation Sensation Light Touch: Impaired Detail Light Touch Impaired Details: Absent LUE Hot/Cold: Impaired by gross assessment Proprioception: Impaired by gross assessment Stereognosis: Impaired Detail Stereognosis Impaired Details: Absent LUE Coordination Gross Motor Movements are Fluid and Coordinated: No Fine Motor Movements are Fluid and Coordinated: No 9 Hole Peg Test: R: 30.28 sec, L: 1:02.14 secs Motor  Motor Motor: Hemiplegia Motor - Skilled Clinical Observations: increased weakness L UE>L LE  Motor - Discharge Observations: Mild L hemi, UE>LE, generalized weakness    Trunk/Postural Assessment  Cervical Assessment Cervical Assessment: Within Functional Limits Thoracic Assessment Thoracic Assessment: Within Functional Limits Lumbar Assessment Lumbar Assessment: Within Functional Limits Postural Control Postural Control: Within Functional Limits  Balance Dynamic Sitting Balance Dynamic Sitting - Balance Support: During functional activity;Feet supported Dynamic Sitting - Level of Assistance: 6: Modified independent (Device/Increase time) Extremity/Trunk Assessment RUE Assessment RUE Assessment: Within Functional Limits LUE Assessment LUE Assessment: Exceptions to [facility] Passive Range of Motion (PROM) Comments: WFL Active Range of Motion (AROM) Comments: WFL General Strength Comments: generalized weakness grossly 4/5 (pt does have history of polio as a child that affected Lt side).   LUE Body System: Neuro Brunstrum levels for arm and hand: Arm;Hand Brunstrum level for arm: Stage V Relative Independence from Synergy Brunstrum level for hand: Stage VI Isolated joint movements   Leroy Libman 05/27/2018, 6:58 AM

## 2018-05-27 NOTE — Progress Notes (Signed)
[place] PHYSICAL MEDICINE & REHABILITATION PROGRESS NOTE   Subjective/Complaints: Appreciate pharmacy note  ROS: Denies CP, SOB, N/V/D  Objective:   No results found. Recent Labs    05/27/18 0540  WBC 6.8  HGB 13.2  HCT 40.6  PLT 253   No results for input(s): NA, K, CL, CO2, GLUCOSE, BUN, CREATININE, CALCIUM in the last 72 hours.  Intake/Output Summary (Last 24 hours) at 05/27/2018 0840 Last data filed at 05/26/2018 1825 Gross per 24 hour  Intake 720 ml  Output -  Net 720 ml     Physical Exam: Vital Signs Blood pressure (!) 141/92, pulse 72, temperature 97.7 F (36.5 C), temperature source Oral, resp. rate 16, height 5\' 3"  (1.6 m), weight 61.6 kg, SpO2 98 %.   Constitutional: No distress . Vital signs reviewed. HENT: Normocephalic.  Atraumatic. Eyes: EOMI. No discharge. Cardiovascular: RRR.  No JVD. Respiratory: CTA bilaterally.  Normal effort. GI: BS +. Non-distended. Musc: No edema or tenderness in extremities. Neurologic: Alert, mild dysarthria, + left neglect Motor: Grossly 5/5 throughout with LUE ataxia, stable Psych: Normal mood.  Normal affect.  Assessment/Plan: 1. Functional deficits secondary to RIght MCA infarct Stable for D/C today F/u PCP in 3-4 weeks F/u PM&R 2 weeks See D/C summary See D/C instructions Care Tool:  Bathing    Body parts bathed by patient: Right arm, Left arm, Chest, Abdomen, Front perineal area, Buttocks, Right upper leg, Left upper leg, Right lower leg, Left lower leg, Face         Bathing assist Assist Level: Supervision/Verbal cueing     Upper Body Dressing/Undressing Upper body dressing   What is the patient wearing?: Pull over shirt, Bra    Upper body assist Assist Level: Minimal Assistance - Patient > 75%(primarily supervision, min assist only needed to fasten bra)    Lower Body Dressing/Undressing Lower body dressing      What is the patient wearing?: Underwear/pull up, Pants     Lower body assist  Assist for lower body dressing: Supervision/Verbal cueing     Toileting Toileting    Toileting assist Assist for toileting: Supervision/Verbal cueing     Transfers Chair/bed transfer  Transfers assist     Chair/bed transfer assist level: Independent     Locomotion Ambulation   Ambulation assist      Assist level: Supervision/Verbal cueing Assistive device: Other (comment)(none) Max distance: 150'   Walk 10 feet activity   Assist     Assist level: Supervision/Verbal cueing Assistive device: Other (comment)(none)   Walk 50 feet activity   Assist    Assist level: Supervision/Verbal cueing Assistive device: Other (comment)(none)    Walk 150 feet activity   Assist    Assist level: Supervision/Verbal cueing Assistive device: Other (comment)(none)    Walk 10 feet on uneven surface  activity   Assist     Assist level: Supervision/Verbal cueing Assistive device: Other (comment)(none)   Wheelchair     Assist Will patient use wheelchair at discharge?: No   Wheelchair activity did not occur: N/A         Wheelchair 50 feet with 2 turns activity    Assist    Wheelchair 50 feet with 2 turns activity did not occur: N/A       Wheelchair 150 feet activity     Assist Wheelchair 150 feet activity did not occur: N/A        Medical Problem List and Plan: 1.  Limitations with mobility, safety, self-care, cognitive deficits secondary to  right MCA infarct.  Continue CIR, 2.  Fibroelastoma aortic valve/DVT Prophylaxis/Anticoagulation: Pharmaceutical: Coumadin  INR subtherapeutic but only slightly so, may d/c home after lovenox dose today on Warfarin 5mg   3. Chronic back pain/Pain Management: Off gabapentin and oxycodone due to myoclonus.  4. Mood: LCSW to follow for evaluation and support.  5. Neuropsych: This patient is capable of making decisions on her own behalf. 6. Skin/Wound Care: Routine pressure relief measures.  7.  Fluids/Electrolytes/Nutrition: Monitor I/O.   8. HTN: Monitor BP bid--continue metoprolol bid. Vitals:   05/26/18 2013 05/27/18 0535  BP: 124/77 (!) 141/92  Pulse: 77 72  Resp: 16 16  Temp: 97.7 F (36.5 C) 97.7 F (36.5 C)  SpO2: 96% 98%   Controlled 1/17 9. T2DM: Hgb A1c-7.0. Continue metformin daily. Monitor BS ac/hs.  CBG (last 3)  Recent Labs    05/26/18 1648 05/26/18 2141 05/27/18 0617  GLUCAP 132* 102* 89   Controlled 1/17 10.  Sleep disturbance: Continue melatonin at nights. sleep wake chart 11. Delirium: Continue Seroquel bid for now.   12.  Fibroelastoma aortic valve- outpt CVTS f/u for removal  LOS: 9 days A FACE TO FACE EVALUATION WAS PERFORMED  Charlett Blake 05/27/2018, 8:40 AM

## 2018-05-27 NOTE — Plan of Care (Signed)
Pt to d/c home with husband.

## 2018-05-27 NOTE — Progress Notes (Signed)
Pt administered lovenox to self while husband watched. Pt was able to verbalize proper technique. Pt does need continued coaching with injections at this time.

## 2018-05-28 DIAGNOSIS — G8929 Other chronic pain: Secondary | ICD-10-CM | POA: Diagnosis not present

## 2018-05-28 DIAGNOSIS — I252 Old myocardial infarction: Secondary | ICD-10-CM | POA: Diagnosis not present

## 2018-05-28 DIAGNOSIS — M1990 Unspecified osteoarthritis, unspecified site: Secondary | ICD-10-CM | POA: Diagnosis not present

## 2018-05-28 DIAGNOSIS — F17210 Nicotine dependence, cigarettes, uncomplicated: Secondary | ICD-10-CM | POA: Diagnosis not present

## 2018-05-28 DIAGNOSIS — M545 Low back pain: Secondary | ICD-10-CM | POA: Diagnosis not present

## 2018-05-28 DIAGNOSIS — Z7984 Long term (current) use of oral hypoglycemic drugs: Secondary | ICD-10-CM | POA: Diagnosis not present

## 2018-05-28 DIAGNOSIS — Z720 Tobacco use: Secondary | ICD-10-CM | POA: Diagnosis not present

## 2018-05-28 DIAGNOSIS — Z7982 Long term (current) use of aspirin: Secondary | ICD-10-CM | POA: Diagnosis not present

## 2018-05-28 DIAGNOSIS — Z7901 Long term (current) use of anticoagulants: Secondary | ICD-10-CM | POA: Diagnosis not present

## 2018-05-28 DIAGNOSIS — I10 Essential (primary) hypertension: Secondary | ICD-10-CM | POA: Diagnosis not present

## 2018-05-28 DIAGNOSIS — E119 Type 2 diabetes mellitus without complications: Secondary | ICD-10-CM | POA: Diagnosis not present

## 2018-05-28 DIAGNOSIS — I2510 Atherosclerotic heart disease of native coronary artery without angina pectoris: Secondary | ICD-10-CM | POA: Diagnosis not present

## 2018-05-28 DIAGNOSIS — I69334 Monoplegia of upper limb following cerebral infarction affecting left non-dominant side: Secondary | ICD-10-CM | POA: Diagnosis not present

## 2018-05-28 DIAGNOSIS — Z9181 History of falling: Secondary | ICD-10-CM | POA: Diagnosis not present

## 2018-05-30 DIAGNOSIS — Z6824 Body mass index (BMI) 24.0-24.9, adult: Secondary | ICD-10-CM | POA: Diagnosis not present

## 2018-05-30 DIAGNOSIS — I10 Essential (primary) hypertension: Secondary | ICD-10-CM | POA: Diagnosis not present

## 2018-05-30 DIAGNOSIS — I639 Cerebral infarction, unspecified: Secondary | ICD-10-CM | POA: Diagnosis not present

## 2018-05-30 DIAGNOSIS — Z299 Encounter for prophylactic measures, unspecified: Secondary | ICD-10-CM | POA: Diagnosis not present

## 2018-05-30 DIAGNOSIS — F419 Anxiety disorder, unspecified: Secondary | ICD-10-CM | POA: Diagnosis not present

## 2018-05-30 DIAGNOSIS — Q239 Congenital malformation of aortic and mitral valves, unspecified: Secondary | ICD-10-CM | POA: Diagnosis not present

## 2018-05-30 DIAGNOSIS — Z87891 Personal history of nicotine dependence: Secondary | ICD-10-CM | POA: Diagnosis not present

## 2018-05-31 ENCOUNTER — Telehealth: Payer: Self-pay | Admitting: Registered Nurse

## 2018-05-31 DIAGNOSIS — M545 Low back pain: Secondary | ICD-10-CM | POA: Diagnosis not present

## 2018-05-31 DIAGNOSIS — M1990 Unspecified osteoarthritis, unspecified site: Secondary | ICD-10-CM | POA: Diagnosis not present

## 2018-05-31 DIAGNOSIS — I10 Essential (primary) hypertension: Secondary | ICD-10-CM | POA: Diagnosis not present

## 2018-05-31 DIAGNOSIS — I69334 Monoplegia of upper limb following cerebral infarction affecting left non-dominant side: Secondary | ICD-10-CM | POA: Diagnosis not present

## 2018-05-31 DIAGNOSIS — I2510 Atherosclerotic heart disease of native coronary artery without angina pectoris: Secondary | ICD-10-CM | POA: Diagnosis not present

## 2018-05-31 DIAGNOSIS — E119 Type 2 diabetes mellitus without complications: Secondary | ICD-10-CM | POA: Diagnosis not present

## 2018-05-31 NOTE — Telephone Encounter (Signed)
Transitional Care call Transitional Care Call Completed  Transitional Care Call Questions answered by Mr. [facility]   Patient name: Rachel Perkins  DOB: 1950/12/22 1. Are you/is patient experiencing any problems since coming home? No a. Are there any questions regarding any aspect of care? No 2. Are there any questions regarding medications administration/dosing? No a. Are meds being taken as prescribed? Yes b. "Patient should review meds with caller to confirm" Medication List Reviewed 3. Have there been any falls? No 4. Has Home Health been to the house and/or have they contacted you? Yes, [place] a. If not, have you tried to contact them? NA b. Can we help you contact them? NA 5. Are bowels and bladder emptying properly? Yes a. Are there any unexpected incontinence issues? NA b. If applicable, is patient following bowel/bladder programs? NA 6. Any fevers, problems with breathing, unexpected pain? NO 7. Are there any skin problems or new areas of breakdown? No 8. Has the patient/family member arranged specialty MD follow up (ie cardiology/neurology/renal/surgical/etc.)?  Yes a. Can we help arrange? NA 9. Does the patient need any other services or support that we can help arrange? No 10. Are caregivers following through as expected in assisting the patient? Yes 11. Has the patient quit smoking, drinking alcohol, or using drugs as recommended? (                        )  Appointment date/time 06/06/2018  arrival time 2:30 for 3:00 appointment with Dr. Letta Pate. At [facility]

## 2018-06-01 DIAGNOSIS — I69334 Monoplegia of upper limb following cerebral infarction affecting left non-dominant side: Secondary | ICD-10-CM | POA: Diagnosis not present

## 2018-06-01 DIAGNOSIS — E119 Type 2 diabetes mellitus without complications: Secondary | ICD-10-CM | POA: Diagnosis not present

## 2018-06-01 DIAGNOSIS — I2510 Atherosclerotic heart disease of native coronary artery without angina pectoris: Secondary | ICD-10-CM | POA: Diagnosis not present

## 2018-06-01 DIAGNOSIS — I10 Essential (primary) hypertension: Secondary | ICD-10-CM | POA: Diagnosis not present

## 2018-06-01 DIAGNOSIS — Z6824 Body mass index (BMI) 24.0-24.9, adult: Secondary | ICD-10-CM | POA: Diagnosis not present

## 2018-06-01 DIAGNOSIS — Z299 Encounter for prophylactic measures, unspecified: Secondary | ICD-10-CM | POA: Diagnosis not present

## 2018-06-01 DIAGNOSIS — M545 Low back pain: Secondary | ICD-10-CM | POA: Diagnosis not present

## 2018-06-01 DIAGNOSIS — E1165 Type 2 diabetes mellitus with hyperglycemia: Secondary | ICD-10-CM | POA: Diagnosis not present

## 2018-06-01 DIAGNOSIS — I639 Cerebral infarction, unspecified: Secondary | ICD-10-CM | POA: Diagnosis not present

## 2018-06-01 DIAGNOSIS — M1990 Unspecified osteoarthritis, unspecified site: Secondary | ICD-10-CM | POA: Diagnosis not present

## 2018-06-02 DIAGNOSIS — E119 Type 2 diabetes mellitus without complications: Secondary | ICD-10-CM | POA: Diagnosis not present

## 2018-06-02 DIAGNOSIS — I69334 Monoplegia of upper limb following cerebral infarction affecting left non-dominant side: Secondary | ICD-10-CM | POA: Diagnosis not present

## 2018-06-02 DIAGNOSIS — I2510 Atherosclerotic heart disease of native coronary artery without angina pectoris: Secondary | ICD-10-CM | POA: Diagnosis not present

## 2018-06-02 DIAGNOSIS — M1990 Unspecified osteoarthritis, unspecified site: Secondary | ICD-10-CM | POA: Diagnosis not present

## 2018-06-02 DIAGNOSIS — M545 Low back pain: Secondary | ICD-10-CM | POA: Diagnosis not present

## 2018-06-02 DIAGNOSIS — I10 Essential (primary) hypertension: Secondary | ICD-10-CM | POA: Diagnosis not present

## 2018-06-06 ENCOUNTER — Encounter: Payer: Self-pay | Admitting: Physical Medicine & Rehabilitation

## 2018-06-06 ENCOUNTER — Ambulatory Visit (HOSPITAL_BASED_OUTPATIENT_CLINIC_OR_DEPARTMENT_OTHER): Payer: Medicare Other | Admitting: Physical Medicine & Rehabilitation

## 2018-06-06 ENCOUNTER — Encounter: Payer: Medicare Other | Attending: Physical Medicine & Rehabilitation

## 2018-06-06 ENCOUNTER — Other Ambulatory Visit: Payer: Self-pay

## 2018-06-06 VITALS — BP 129/82 | HR 75 | Ht 63.0 in | Wt 136.0 lb

## 2018-06-06 DIAGNOSIS — Z7901 Long term (current) use of anticoagulants: Secondary | ICD-10-CM | POA: Diagnosis not present

## 2018-06-06 DIAGNOSIS — I10 Essential (primary) hypertension: Secondary | ICD-10-CM | POA: Insufficient documentation

## 2018-06-06 DIAGNOSIS — E119 Type 2 diabetes mellitus without complications: Secondary | ICD-10-CM | POA: Insufficient documentation

## 2018-06-06 DIAGNOSIS — Z789 Other specified health status: Secondary | ICD-10-CM

## 2018-06-06 DIAGNOSIS — Z7984 Long term (current) use of oral hypoglycemic drugs: Secondary | ICD-10-CM | POA: Insufficient documentation

## 2018-06-06 DIAGNOSIS — I2510 Atherosclerotic heart disease of native coronary artery without angina pectoris: Secondary | ICD-10-CM | POA: Insufficient documentation

## 2018-06-06 DIAGNOSIS — I63411 Cerebral infarction due to embolism of right middle cerebral artery: Secondary | ICD-10-CM | POA: Diagnosis not present

## 2018-06-06 DIAGNOSIS — G894 Chronic pain syndrome: Secondary | ICD-10-CM | POA: Insufficient documentation

## 2018-06-06 DIAGNOSIS — Z7982 Long term (current) use of aspirin: Secondary | ICD-10-CM | POA: Diagnosis not present

## 2018-06-06 NOTE — Patient Instructions (Signed)
Make appointment with Neurologist after you see heart Dr  Neurologist will decide when you are ready to drive  Please call Dr Manuella Ghazi for outpatient speech therapy orders once home health speech is done.

## 2018-06-06 NOTE — Progress Notes (Signed)
Subjective:  Discharge from inpatient rehabilitation service 10 days ago, transitional care phone call made 24 hours post discharge  Patient ID: Rachel Perkins, female    DOB: 1950/10/27, 68 y.o.   MRN: [id number] 68 year old RH female with history of CAD, T2DM, chronic pain; who was admitted to [facility] health 05/11/2018 via [facility] with left upper extremity weakness and numbness due to right MCA distribution infarct and right MCA M2 branch occlusion.  Patient not felt to be a candidate for thrombectomy due to low NIHSS. MRI brain done revealing a large acute infarct in right MCA distribution and small acute infarcts in left frontal and right parietal lobes.    Neurology felt the stroke was cardioembolic and recommended 81-WEX heart monitor to rule out A. Fib. TEE done revealing EF of 65 to 70% and small mobile mass on aortic valve concerning for fibroelastioma v/s atheroma v/s thrombus or endocarditis.  Blood cultures drawn and were negative. She was started on IV heparin for treatment.    Repeat CT head was negative for bleed and she was transitioned to Lovenox/Coumadin bridge.  Dr. Adonis Housekeeper CT surgery recommended resection of mass post discharge from rehab.  She did have mild clonus type activity --EEG negative as well as issues with delirium.  Oxycodone and gabapentin were discontinued and she was started on low-dose Seroquel with improvement in sleep-wake disruption.  Patient continued to be limited by left upper extremity ataxia, left sensory deficits and left inattention.  Therapy ongoing and recommended CIR level therapy.  Chart reviewed by rehab MD and case manager and patient felt to be a good CIR candidate.  HPI  D/C to home from CIR  Admit date: 05/18/2018 Discharge date: 05/27/2018  Left hand still tingly/numb  Left leg tingly and numb as well Left side of face less sensitive to touch than RIght   Able to use left hand but has problems with control patient will drop objects  according to her husband  Walking independantly, no falls  Seen by PCP twice, INR has been regulated  Has appointment with cardiology next week.  Does not have an appointment with neurology   Pain Inventory Average Pain 5 Pain Right Now 5 My pain is burning  In the last 24 hours, has pain interfered with the following? General activity 0 Relation with others 3 Enjoyment of life 7 What TIME of day is your pain at its worst? evening Sleep (in general) Good  Pain is worse with: sitting Pain improves with: other Relief from Meds: 4  Mobility how many minutes can you walk? 10 ability to climb steps?  no do you drive?  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  no  Physicians involved in your care Any changes since last visit?  no Primary care Dr. Manuella Ghazi   Family History  Problem Relation Age of Onset  . Heart failure Father 32  . Cirrhosis Mother   . Alcohol abuse Mother   . Diabetes Sister   . Breast cancer Sister        twin sister  . Uterine cancer Sister    Social History   Socioeconomic History  . Marital status: Married    Spouse name: Not on file  . Number of children:  2  . Years of education: Not on file  . Highest education level: Not on file  Occupational History  . Occupation: Biochemist, clinical  Social Needs  . Financial resource strain: Not on file  . Food insecurity:    Worry: Never true    Inability: Never true  . Transportation needs:    Medical: Not on file    Non-medical: Not on file  Tobacco Use  . Smoking status: Current Every Day Smoker    Packs/day: 0.50    Years: 50.00    Pack years: 25.00    Types: Cigarettes    Start date: 06/04/1964    Last attempt to quit: 11/16/2015    Years since  quitting: 2.5  . Smokeless tobacco: Never Used  Substance and Sexual Activity  . Alcohol use: No    Alcohol/week: 0.0 standard drinks  . Drug use: No  . Sexual activity: Not on file  Lifestyle  . Physical activity:    Days per week: Not on file    Minutes per session: Not on file  . Stress: Not on file  Relationships  . Social connections:    Talks on phone: Not on file    Gets together: Not on file    Attends religious service: Not on file    Active member of club or organization: Not on file    Attends meetings of clubs or organizations: Not on file    Relationship status: Not on file  Other Topics Concern  . Not on file  Social History Narrative  . Not on file   Past Surgical History:  Procedure Laterality Date  . ABDOMINAL HYSTERECTOMY    . ANKLE SURGERY    . APPENDECTOMY    . CORONARY ANGIOPLASTY  09/25/2013   des mid circumflex  des rca  . LEFT HEART CATHETERIZATION WITH CORONARY ANGIOGRAM N/A 09/25/2013   Procedure: LEFT HEART CATHETERIZATION WITH CORONARY ANGIOGRAM;  Surgeon: Burnell Blanks, MD;  Location: [facility] CATH LAB;  Service: Cardiovascular;  Laterality: N/A;  . skin grafts left arm     Past Medical History:  Diagnosis Date  . Anginal pain ([place])   . Arthritis   . CAD (coronary artery disease)    a. 09/25/13 s/p DES to [facility] and DES to pRCA.  Marland Kitchen Chronic left-sided back pain    has had ESI/Dr. Francesco Runner  . DM (diabetes mellitus) ([place])    a. diet controlled.   Marland Kitchen HTN (hypertension)   . Myocardial infarction [facility]) 09/2013   nstemi  . Tobacco abuse    BP 129/82   Pulse 75   Ht 5\' 3"  (1.6 m)   Wt 136 lb (61.7 kg)   SpO2 96%   BMI 24.09 kg/m   Opioid Risk Score:   Fall Risk Score:  `1  Depression screen PHQ 2/9  Depression screen [facility] 2/9 06/06/2018 11/06/2013  Decreased Interest 0 0  Down, Depressed, Hopeless 0 1  PHQ - 2 Score 0 1    Review of Systems  Constitutional: Negative.   HENT: Negative.   Eyes: Negative.   Respiratory: Negative.     Cardiovascular: Negative.   Gastrointestinal: Negative.   Endocrine: Negative.   Genitourinary: Negative.   Musculoskeletal: Negative.   Skin: Negative.   Allergic/Immunologic: Negative.   Neurological: Negative.   Hematological: Bruises/bleeds easily.  Psychiatric/Behavioral: Negative.   All other systems reviewed and are negative.      Objective:   Physical Exam Vitals signs and nursing note reviewed.  Constitutional:  General: She is not in acute distress.    Appearance: Normal appearance. She is not ill-appearing.  HENT:     Head: Normocephalic and atraumatic.     Nose: No congestion.     Mouth/Throat:     Mouth: Mucous membranes are moist.  Eyes:     Conjunctiva/sclera: Conjunctivae normal.  Musculoskeletal:        General: No swelling, tenderness or deformity.  Skin:    General: Skin is warm and dry.  Neurological:     General: No focal deficit present.     Mental Status: She is alert and oriented to person, place, and time.     Cranial Nerves: No dysarthria or facial asymmetry.     Sensory: Sensory deficit present.     Motor: No weakness, tremor, abnormal muscle tone or pronator drift.     Coordination: Coordination is intact.     Gait: Gait normal.     Comments: Motor strength is 5/5 bilateral deltoid bicep tricep grip hip flexor knee extensor ankle dorsiflexor.  Gait is without toe drag or knee instability.    Psychiatric:        Mood and Affect: Mood normal.        Behavior: Behavior normal.           Assessment & Plan:  1.  History of right MCA infarct with residual left hemisensory deficits his main deficit.  She does have some higher-level cognitive deficits according to her husband is working on medication management as well as planning, time orientation. She should not drive at the current time and can be cleared by neurology in the future.  There is a good chance she can get back to driving however. We discussed time course of recovery  with higher-level cognitive deficits as well as with her sensory deficits.  We discussed that her sensory deficits usually plateau at around 6 months and the cognitive deficits at 9 to 12 months. 2.  Fibro-elastoma intracardiac, follow-up with cardiovascular surgery next week 3.  Recommend stroke follow-up with neurology patient will call for appointment  4.  Anticoagulation for stroke prevention, primary care to manage  In terms of her rehabilitation she is doing quite well, she will need some longer term speech therapy and I would anticipate her requiring some outpatient speech therapy once home health finishes up.  This can be ordered by her primary care or she can call this office to order.  She lives in [place] and can either go to [place] or to [place], [place], [place]

## 2018-06-07 ENCOUNTER — Encounter: Payer: Medicare Other | Admitting: Physical Medicine & Rehabilitation

## 2018-06-07 DIAGNOSIS — I69334 Monoplegia of upper limb following cerebral infarction affecting left non-dominant side: Secondary | ICD-10-CM | POA: Diagnosis not present

## 2018-06-07 DIAGNOSIS — M1990 Unspecified osteoarthritis, unspecified site: Secondary | ICD-10-CM | POA: Diagnosis not present

## 2018-06-07 DIAGNOSIS — E119 Type 2 diabetes mellitus without complications: Secondary | ICD-10-CM | POA: Diagnosis not present

## 2018-06-07 DIAGNOSIS — M545 Low back pain: Secondary | ICD-10-CM | POA: Diagnosis not present

## 2018-06-07 DIAGNOSIS — I2510 Atherosclerotic heart disease of native coronary artery without angina pectoris: Secondary | ICD-10-CM | POA: Diagnosis not present

## 2018-06-07 DIAGNOSIS — I10 Essential (primary) hypertension: Secondary | ICD-10-CM | POA: Diagnosis not present

## 2018-06-08 DIAGNOSIS — I10 Essential (primary) hypertension: Secondary | ICD-10-CM | POA: Diagnosis not present

## 2018-06-08 DIAGNOSIS — I639 Cerebral infarction, unspecified: Secondary | ICD-10-CM | POA: Diagnosis not present

## 2018-06-08 DIAGNOSIS — Z6824 Body mass index (BMI) 24.0-24.9, adult: Secondary | ICD-10-CM | POA: Diagnosis not present

## 2018-06-08 DIAGNOSIS — N183 Chronic kidney disease, stage 3 (moderate): Secondary | ICD-10-CM | POA: Diagnosis not present

## 2018-06-08 DIAGNOSIS — F419 Anxiety disorder, unspecified: Secondary | ICD-10-CM | POA: Diagnosis not present

## 2018-06-08 DIAGNOSIS — Z299 Encounter for prophylactic measures, unspecified: Secondary | ICD-10-CM | POA: Diagnosis not present

## 2018-06-09 DIAGNOSIS — I69334 Monoplegia of upper limb following cerebral infarction affecting left non-dominant side: Secondary | ICD-10-CM | POA: Diagnosis not present

## 2018-06-09 DIAGNOSIS — I10 Essential (primary) hypertension: Secondary | ICD-10-CM | POA: Diagnosis not present

## 2018-06-09 DIAGNOSIS — I2510 Atherosclerotic heart disease of native coronary artery without angina pectoris: Secondary | ICD-10-CM | POA: Diagnosis not present

## 2018-06-09 DIAGNOSIS — E119 Type 2 diabetes mellitus without complications: Secondary | ICD-10-CM | POA: Diagnosis not present

## 2018-06-09 DIAGNOSIS — M1990 Unspecified osteoarthritis, unspecified site: Secondary | ICD-10-CM | POA: Diagnosis not present

## 2018-06-09 DIAGNOSIS — M545 Low back pain: Secondary | ICD-10-CM | POA: Diagnosis not present

## 2018-06-14 DIAGNOSIS — I69334 Monoplegia of upper limb following cerebral infarction affecting left non-dominant side: Secondary | ICD-10-CM | POA: Diagnosis not present

## 2018-06-14 DIAGNOSIS — I2510 Atherosclerotic heart disease of native coronary artery without angina pectoris: Secondary | ICD-10-CM | POA: Diagnosis not present

## 2018-06-14 DIAGNOSIS — E119 Type 2 diabetes mellitus without complications: Secondary | ICD-10-CM | POA: Diagnosis not present

## 2018-06-14 DIAGNOSIS — I10 Essential (primary) hypertension: Secondary | ICD-10-CM | POA: Diagnosis not present

## 2018-06-14 DIAGNOSIS — M545 Low back pain: Secondary | ICD-10-CM | POA: Diagnosis not present

## 2018-06-14 DIAGNOSIS — M1990 Unspecified osteoarthritis, unspecified site: Secondary | ICD-10-CM | POA: Diagnosis not present

## 2018-06-16 DIAGNOSIS — M1990 Unspecified osteoarthritis, unspecified site: Secondary | ICD-10-CM | POA: Diagnosis not present

## 2018-06-16 DIAGNOSIS — I10 Essential (primary) hypertension: Secondary | ICD-10-CM | POA: Diagnosis not present

## 2018-06-16 DIAGNOSIS — I2510 Atherosclerotic heart disease of native coronary artery without angina pectoris: Secondary | ICD-10-CM | POA: Diagnosis not present

## 2018-06-16 DIAGNOSIS — E119 Type 2 diabetes mellitus without complications: Secondary | ICD-10-CM | POA: Diagnosis not present

## 2018-06-16 DIAGNOSIS — I69334 Monoplegia of upper limb following cerebral infarction affecting left non-dominant side: Secondary | ICD-10-CM | POA: Diagnosis not present

## 2018-06-16 DIAGNOSIS — M545 Low back pain: Secondary | ICD-10-CM | POA: Diagnosis not present

## 2018-06-20 DIAGNOSIS — M545 Low back pain: Secondary | ICD-10-CM | POA: Diagnosis not present

## 2018-06-20 DIAGNOSIS — I2510 Atherosclerotic heart disease of native coronary artery without angina pectoris: Secondary | ICD-10-CM | POA: Diagnosis not present

## 2018-06-20 DIAGNOSIS — I69334 Monoplegia of upper limb following cerebral infarction affecting left non-dominant side: Secondary | ICD-10-CM | POA: Diagnosis not present

## 2018-06-20 DIAGNOSIS — I10 Essential (primary) hypertension: Secondary | ICD-10-CM | POA: Diagnosis not present

## 2018-06-20 DIAGNOSIS — M1990 Unspecified osteoarthritis, unspecified site: Secondary | ICD-10-CM | POA: Diagnosis not present

## 2018-06-20 DIAGNOSIS — E119 Type 2 diabetes mellitus without complications: Secondary | ICD-10-CM | POA: Diagnosis not present

## 2018-06-21 ENCOUNTER — Other Ambulatory Visit (HOSPITAL_COMMUNITY): Payer: Self-pay | Admitting: Physical Medicine and Rehabilitation

## 2018-06-21 DIAGNOSIS — M1990 Unspecified osteoarthritis, unspecified site: Secondary | ICD-10-CM | POA: Diagnosis not present

## 2018-06-21 DIAGNOSIS — I10 Essential (primary) hypertension: Secondary | ICD-10-CM | POA: Diagnosis not present

## 2018-06-21 DIAGNOSIS — E119 Type 2 diabetes mellitus without complications: Secondary | ICD-10-CM | POA: Diagnosis not present

## 2018-06-21 DIAGNOSIS — M545 Low back pain: Secondary | ICD-10-CM | POA: Diagnosis not present

## 2018-06-21 DIAGNOSIS — I2510 Atherosclerotic heart disease of native coronary artery without angina pectoris: Secondary | ICD-10-CM | POA: Diagnosis not present

## 2018-06-21 DIAGNOSIS — I69334 Monoplegia of upper limb following cerebral infarction affecting left non-dominant side: Secondary | ICD-10-CM | POA: Diagnosis not present

## 2018-06-22 DIAGNOSIS — I358 Other nonrheumatic aortic valve disorders: Secondary | ICD-10-CM | POA: Diagnosis not present

## 2018-06-23 DIAGNOSIS — Z299 Encounter for prophylactic measures, unspecified: Secondary | ICD-10-CM | POA: Diagnosis not present

## 2018-06-23 DIAGNOSIS — E1165 Type 2 diabetes mellitus with hyperglycemia: Secondary | ICD-10-CM | POA: Diagnosis not present

## 2018-06-23 DIAGNOSIS — I639 Cerebral infarction, unspecified: Secondary | ICD-10-CM | POA: Diagnosis not present

## 2018-06-23 DIAGNOSIS — Z6825 Body mass index (BMI) 25.0-25.9, adult: Secondary | ICD-10-CM | POA: Diagnosis not present

## 2018-06-23 DIAGNOSIS — I10 Essential (primary) hypertension: Secondary | ICD-10-CM | POA: Diagnosis not present

## 2018-06-24 DIAGNOSIS — I10 Essential (primary) hypertension: Secondary | ICD-10-CM | POA: Diagnosis not present

## 2018-06-24 DIAGNOSIS — M1990 Unspecified osteoarthritis, unspecified site: Secondary | ICD-10-CM | POA: Diagnosis not present

## 2018-06-24 DIAGNOSIS — E119 Type 2 diabetes mellitus without complications: Secondary | ICD-10-CM | POA: Diagnosis not present

## 2018-06-24 DIAGNOSIS — M545 Low back pain: Secondary | ICD-10-CM | POA: Diagnosis not present

## 2018-06-24 DIAGNOSIS — I69334 Monoplegia of upper limb following cerebral infarction affecting left non-dominant side: Secondary | ICD-10-CM | POA: Diagnosis not present

## 2018-06-24 DIAGNOSIS — I2510 Atherosclerotic heart disease of native coronary artery without angina pectoris: Secondary | ICD-10-CM | POA: Diagnosis not present

## 2018-06-27 DIAGNOSIS — Z7984 Long term (current) use of oral hypoglycemic drugs: Secondary | ICD-10-CM | POA: Diagnosis not present

## 2018-06-27 DIAGNOSIS — Z7982 Long term (current) use of aspirin: Secondary | ICD-10-CM | POA: Diagnosis not present

## 2018-06-27 DIAGNOSIS — I69334 Monoplegia of upper limb following cerebral infarction affecting left non-dominant side: Secondary | ICD-10-CM | POA: Diagnosis not present

## 2018-06-27 DIAGNOSIS — E119 Type 2 diabetes mellitus without complications: Secondary | ICD-10-CM | POA: Diagnosis not present

## 2018-06-27 DIAGNOSIS — Z9181 History of falling: Secondary | ICD-10-CM | POA: Diagnosis not present

## 2018-06-27 DIAGNOSIS — F17210 Nicotine dependence, cigarettes, uncomplicated: Secondary | ICD-10-CM | POA: Diagnosis not present

## 2018-06-27 DIAGNOSIS — I252 Old myocardial infarction: Secondary | ICD-10-CM | POA: Diagnosis not present

## 2018-06-27 DIAGNOSIS — G8929 Other chronic pain: Secondary | ICD-10-CM | POA: Diagnosis not present

## 2018-06-27 DIAGNOSIS — Z720 Tobacco use: Secondary | ICD-10-CM | POA: Diagnosis not present

## 2018-06-27 DIAGNOSIS — M1990 Unspecified osteoarthritis, unspecified site: Secondary | ICD-10-CM | POA: Diagnosis not present

## 2018-06-27 DIAGNOSIS — M545 Low back pain: Secondary | ICD-10-CM | POA: Diagnosis not present

## 2018-06-27 DIAGNOSIS — I10 Essential (primary) hypertension: Secondary | ICD-10-CM | POA: Diagnosis not present

## 2018-06-27 DIAGNOSIS — I2510 Atherosclerotic heart disease of native coronary artery without angina pectoris: Secondary | ICD-10-CM | POA: Diagnosis not present

## 2018-06-27 DIAGNOSIS — Z7901 Long term (current) use of anticoagulants: Secondary | ICD-10-CM | POA: Diagnosis not present

## 2018-06-28 DIAGNOSIS — E119 Type 2 diabetes mellitus without complications: Secondary | ICD-10-CM | POA: Diagnosis not present

## 2018-06-28 DIAGNOSIS — I10 Essential (primary) hypertension: Secondary | ICD-10-CM | POA: Diagnosis not present

## 2018-06-28 DIAGNOSIS — I2510 Atherosclerotic heart disease of native coronary artery without angina pectoris: Secondary | ICD-10-CM | POA: Diagnosis not present

## 2018-06-28 DIAGNOSIS — M545 Low back pain: Secondary | ICD-10-CM | POA: Diagnosis not present

## 2018-06-28 DIAGNOSIS — M1990 Unspecified osteoarthritis, unspecified site: Secondary | ICD-10-CM | POA: Diagnosis not present

## 2018-06-28 DIAGNOSIS — I69334 Monoplegia of upper limb following cerebral infarction affecting left non-dominant side: Secondary | ICD-10-CM | POA: Diagnosis not present

## 2018-06-29 DIAGNOSIS — Z8673 Personal history of transient ischemic attack (TIA), and cerebral infarction without residual deficits: Secondary | ICD-10-CM | POA: Diagnosis not present

## 2018-06-29 DIAGNOSIS — Z833 Family history of diabetes mellitus: Secondary | ICD-10-CM | POA: Diagnosis not present

## 2018-06-29 DIAGNOSIS — Z955 Presence of coronary angioplasty implant and graft: Secondary | ICD-10-CM | POA: Diagnosis not present

## 2018-06-29 DIAGNOSIS — I2510 Atherosclerotic heart disease of native coronary artery without angina pectoris: Secondary | ICD-10-CM | POA: Diagnosis not present

## 2018-06-29 DIAGNOSIS — I351 Nonrheumatic aortic (valve) insufficiency: Secondary | ICD-10-CM | POA: Diagnosis present

## 2018-06-29 DIAGNOSIS — Z5309 Procedure and treatment not carried out because of other contraindication: Secondary | ICD-10-CM | POA: Diagnosis not present

## 2018-06-29 DIAGNOSIS — I513 Intracardiac thrombosis, not elsewhere classified: Secondary | ICD-10-CM | POA: Diagnosis not present

## 2018-06-29 DIAGNOSIS — I639 Cerebral infarction, unspecified: Secondary | ICD-10-CM | POA: Diagnosis not present

## 2018-06-29 DIAGNOSIS — Z8249 Family history of ischemic heart disease and other diseases of the circulatory system: Secondary | ICD-10-CM | POA: Diagnosis not present

## 2018-06-29 DIAGNOSIS — J9811 Atelectasis: Secondary | ICD-10-CM | POA: Diagnosis not present

## 2018-06-29 DIAGNOSIS — Z7901 Long term (current) use of anticoagulants: Secondary | ICD-10-CM | POA: Diagnosis not present

## 2018-06-29 DIAGNOSIS — F17210 Nicotine dependence, cigarettes, uncomplicated: Secondary | ICD-10-CM | POA: Diagnosis present

## 2018-06-29 DIAGNOSIS — I358 Other nonrheumatic aortic valve disorders: Secondary | ICD-10-CM | POA: Diagnosis not present

## 2018-06-29 DIAGNOSIS — Z01818 Encounter for other preprocedural examination: Secondary | ICD-10-CM | POA: Diagnosis not present

## 2018-06-29 DIAGNOSIS — Z538 Procedure and treatment not carried out for other reasons: Secondary | ICD-10-CM | POA: Diagnosis not present

## 2018-06-29 DIAGNOSIS — Z7984 Long term (current) use of oral hypoglycemic drugs: Secondary | ICD-10-CM | POA: Diagnosis not present

## 2018-06-29 DIAGNOSIS — E119 Type 2 diabetes mellitus without complications: Secondary | ICD-10-CM | POA: Diagnosis present

## 2018-06-29 DIAGNOSIS — I340 Nonrheumatic mitral (valve) insufficiency: Secondary | ICD-10-CM | POA: Diagnosis not present

## 2018-07-02 DIAGNOSIS — I10 Essential (primary) hypertension: Secondary | ICD-10-CM | POA: Diagnosis not present

## 2018-07-02 DIAGNOSIS — I69334 Monoplegia of upper limb following cerebral infarction affecting left non-dominant side: Secondary | ICD-10-CM | POA: Diagnosis not present

## 2018-07-02 DIAGNOSIS — I2510 Atherosclerotic heart disease of native coronary artery without angina pectoris: Secondary | ICD-10-CM | POA: Diagnosis not present

## 2018-07-02 DIAGNOSIS — M1990 Unspecified osteoarthritis, unspecified site: Secondary | ICD-10-CM | POA: Diagnosis not present

## 2018-07-02 DIAGNOSIS — M545 Low back pain: Secondary | ICD-10-CM | POA: Diagnosis not present

## 2018-07-02 DIAGNOSIS — E119 Type 2 diabetes mellitus without complications: Secondary | ICD-10-CM | POA: Diagnosis not present

## 2018-07-05 DIAGNOSIS — I639 Cerebral infarction, unspecified: Secondary | ICD-10-CM | POA: Diagnosis not present

## 2018-07-05 DIAGNOSIS — Z6825 Body mass index (BMI) 25.0-25.9, adult: Secondary | ICD-10-CM | POA: Diagnosis not present

## 2018-07-05 DIAGNOSIS — K210 Gastro-esophageal reflux disease with esophagitis: Secondary | ICD-10-CM | POA: Diagnosis not present

## 2018-07-05 DIAGNOSIS — R413 Other amnesia: Secondary | ICD-10-CM | POA: Diagnosis not present

## 2018-07-05 DIAGNOSIS — Z299 Encounter for prophylactic measures, unspecified: Secondary | ICD-10-CM | POA: Diagnosis not present

## 2018-07-05 DIAGNOSIS — I10 Essential (primary) hypertension: Secondary | ICD-10-CM | POA: Diagnosis not present

## 2018-07-07 DIAGNOSIS — M545 Low back pain: Secondary | ICD-10-CM | POA: Diagnosis not present

## 2018-07-07 DIAGNOSIS — I10 Essential (primary) hypertension: Secondary | ICD-10-CM | POA: Diagnosis not present

## 2018-07-07 DIAGNOSIS — I2510 Atherosclerotic heart disease of native coronary artery without angina pectoris: Secondary | ICD-10-CM | POA: Diagnosis not present

## 2018-07-07 DIAGNOSIS — M1990 Unspecified osteoarthritis, unspecified site: Secondary | ICD-10-CM | POA: Diagnosis not present

## 2018-07-07 DIAGNOSIS — E119 Type 2 diabetes mellitus without complications: Secondary | ICD-10-CM | POA: Diagnosis not present

## 2018-07-07 DIAGNOSIS — I69334 Monoplegia of upper limb following cerebral infarction affecting left non-dominant side: Secondary | ICD-10-CM | POA: Diagnosis not present

## 2018-07-08 DIAGNOSIS — I69334 Monoplegia of upper limb following cerebral infarction affecting left non-dominant side: Secondary | ICD-10-CM | POA: Diagnosis not present

## 2018-07-08 DIAGNOSIS — M1990 Unspecified osteoarthritis, unspecified site: Secondary | ICD-10-CM | POA: Diagnosis not present

## 2018-07-08 DIAGNOSIS — E119 Type 2 diabetes mellitus without complications: Secondary | ICD-10-CM | POA: Diagnosis not present

## 2018-07-08 DIAGNOSIS — I10 Essential (primary) hypertension: Secondary | ICD-10-CM | POA: Diagnosis not present

## 2018-07-08 DIAGNOSIS — I2510 Atherosclerotic heart disease of native coronary artery without angina pectoris: Secondary | ICD-10-CM | POA: Diagnosis not present

## 2018-07-08 DIAGNOSIS — M545 Low back pain: Secondary | ICD-10-CM | POA: Diagnosis not present

## 2018-07-12 DIAGNOSIS — I2510 Atherosclerotic heart disease of native coronary artery without angina pectoris: Secondary | ICD-10-CM | POA: Diagnosis not present

## 2018-07-12 DIAGNOSIS — I639 Cerebral infarction, unspecified: Secondary | ICD-10-CM | POA: Diagnosis not present

## 2018-07-12 DIAGNOSIS — M545 Low back pain: Secondary | ICD-10-CM | POA: Diagnosis not present

## 2018-07-12 DIAGNOSIS — Z299 Encounter for prophylactic measures, unspecified: Secondary | ICD-10-CM | POA: Diagnosis not present

## 2018-07-12 DIAGNOSIS — M1990 Unspecified osteoarthritis, unspecified site: Secondary | ICD-10-CM | POA: Diagnosis not present

## 2018-07-12 DIAGNOSIS — E119 Type 2 diabetes mellitus without complications: Secondary | ICD-10-CM | POA: Diagnosis not present

## 2018-07-12 DIAGNOSIS — E1165 Type 2 diabetes mellitus with hyperglycemia: Secondary | ICD-10-CM | POA: Diagnosis not present

## 2018-07-12 DIAGNOSIS — I10 Essential (primary) hypertension: Secondary | ICD-10-CM | POA: Diagnosis not present

## 2018-07-12 DIAGNOSIS — Z6825 Body mass index (BMI) 25.0-25.9, adult: Secondary | ICD-10-CM | POA: Diagnosis not present

## 2018-07-12 DIAGNOSIS — I69334 Monoplegia of upper limb following cerebral infarction affecting left non-dominant side: Secondary | ICD-10-CM | POA: Diagnosis not present

## 2018-07-13 DIAGNOSIS — I358 Other nonrheumatic aortic valve disorders: Secondary | ICD-10-CM | POA: Diagnosis not present

## 2018-07-14 DIAGNOSIS — E119 Type 2 diabetes mellitus without complications: Secondary | ICD-10-CM | POA: Diagnosis not present

## 2018-07-14 DIAGNOSIS — I10 Essential (primary) hypertension: Secondary | ICD-10-CM | POA: Diagnosis not present

## 2018-07-14 DIAGNOSIS — M1990 Unspecified osteoarthritis, unspecified site: Secondary | ICD-10-CM | POA: Diagnosis not present

## 2018-07-14 DIAGNOSIS — I2510 Atherosclerotic heart disease of native coronary artery without angina pectoris: Secondary | ICD-10-CM | POA: Diagnosis not present

## 2018-07-14 DIAGNOSIS — I69334 Monoplegia of upper limb following cerebral infarction affecting left non-dominant side: Secondary | ICD-10-CM | POA: Diagnosis not present

## 2018-07-14 DIAGNOSIS — M545 Low back pain: Secondary | ICD-10-CM | POA: Diagnosis not present

## 2018-07-15 DIAGNOSIS — I10 Essential (primary) hypertension: Secondary | ICD-10-CM | POA: Diagnosis not present

## 2018-07-15 DIAGNOSIS — I2510 Atherosclerotic heart disease of native coronary artery without angina pectoris: Secondary | ICD-10-CM | POA: Diagnosis not present

## 2018-07-15 DIAGNOSIS — M545 Low back pain: Secondary | ICD-10-CM | POA: Diagnosis not present

## 2018-07-15 DIAGNOSIS — M1990 Unspecified osteoarthritis, unspecified site: Secondary | ICD-10-CM | POA: Diagnosis not present

## 2018-07-15 DIAGNOSIS — I69334 Monoplegia of upper limb following cerebral infarction affecting left non-dominant side: Secondary | ICD-10-CM | POA: Diagnosis not present

## 2018-07-15 DIAGNOSIS — E119 Type 2 diabetes mellitus without complications: Secondary | ICD-10-CM | POA: Diagnosis not present

## 2018-07-19 DIAGNOSIS — I10 Essential (primary) hypertension: Secondary | ICD-10-CM | POA: Diagnosis not present

## 2018-07-19 DIAGNOSIS — I69334 Monoplegia of upper limb following cerebral infarction affecting left non-dominant side: Secondary | ICD-10-CM | POA: Diagnosis not present

## 2018-07-19 DIAGNOSIS — E119 Type 2 diabetes mellitus without complications: Secondary | ICD-10-CM | POA: Diagnosis not present

## 2018-07-19 DIAGNOSIS — I2510 Atherosclerotic heart disease of native coronary artery without angina pectoris: Secondary | ICD-10-CM | POA: Diagnosis not present

## 2018-07-19 DIAGNOSIS — M545 Low back pain: Secondary | ICD-10-CM | POA: Diagnosis not present

## 2018-07-19 DIAGNOSIS — M1990 Unspecified osteoarthritis, unspecified site: Secondary | ICD-10-CM | POA: Diagnosis not present

## 2018-07-20 DIAGNOSIS — Z6825 Body mass index (BMI) 25.0-25.9, adult: Secondary | ICD-10-CM | POA: Diagnosis not present

## 2018-07-20 DIAGNOSIS — E1165 Type 2 diabetes mellitus with hyperglycemia: Secondary | ICD-10-CM | POA: Diagnosis not present

## 2018-07-20 DIAGNOSIS — I10 Essential (primary) hypertension: Secondary | ICD-10-CM | POA: Diagnosis not present

## 2018-07-20 DIAGNOSIS — R413 Other amnesia: Secondary | ICD-10-CM | POA: Diagnosis not present

## 2018-07-20 DIAGNOSIS — Z299 Encounter for prophylactic measures, unspecified: Secondary | ICD-10-CM | POA: Diagnosis not present

## 2018-07-20 DIAGNOSIS — E1122 Type 2 diabetes mellitus with diabetic chronic kidney disease: Secondary | ICD-10-CM | POA: Diagnosis not present

## 2018-07-20 DIAGNOSIS — N183 Chronic kidney disease, stage 3 (moderate): Secondary | ICD-10-CM | POA: Diagnosis not present

## 2018-07-20 DIAGNOSIS — M79605 Pain in left leg: Secondary | ICD-10-CM | POA: Diagnosis not present

## 2018-07-21 DIAGNOSIS — M545 Low back pain: Secondary | ICD-10-CM | POA: Diagnosis not present

## 2018-07-21 DIAGNOSIS — I10 Essential (primary) hypertension: Secondary | ICD-10-CM | POA: Diagnosis not present

## 2018-07-21 DIAGNOSIS — I2510 Atherosclerotic heart disease of native coronary artery without angina pectoris: Secondary | ICD-10-CM | POA: Diagnosis not present

## 2018-07-21 DIAGNOSIS — M1990 Unspecified osteoarthritis, unspecified site: Secondary | ICD-10-CM | POA: Diagnosis not present

## 2018-07-21 DIAGNOSIS — E119 Type 2 diabetes mellitus without complications: Secondary | ICD-10-CM | POA: Diagnosis not present

## 2018-07-21 DIAGNOSIS — I69334 Monoplegia of upper limb following cerebral infarction affecting left non-dominant side: Secondary | ICD-10-CM | POA: Diagnosis not present

## 2018-07-22 DIAGNOSIS — I10 Essential (primary) hypertension: Secondary | ICD-10-CM | POA: Diagnosis not present

## 2018-07-22 DIAGNOSIS — E119 Type 2 diabetes mellitus without complications: Secondary | ICD-10-CM | POA: Diagnosis not present

## 2018-07-22 DIAGNOSIS — I2510 Atherosclerotic heart disease of native coronary artery without angina pectoris: Secondary | ICD-10-CM | POA: Diagnosis not present

## 2018-07-22 DIAGNOSIS — I69334 Monoplegia of upper limb following cerebral infarction affecting left non-dominant side: Secondary | ICD-10-CM | POA: Diagnosis not present

## 2018-07-22 DIAGNOSIS — M1990 Unspecified osteoarthritis, unspecified site: Secondary | ICD-10-CM | POA: Diagnosis not present

## 2018-07-22 DIAGNOSIS — M545 Low back pain: Secondary | ICD-10-CM | POA: Diagnosis not present

## 2018-07-25 DIAGNOSIS — I2510 Atherosclerotic heart disease of native coronary artery without angina pectoris: Secondary | ICD-10-CM | POA: Diagnosis not present

## 2018-07-25 DIAGNOSIS — I10 Essential (primary) hypertension: Secondary | ICD-10-CM | POA: Diagnosis not present

## 2018-07-25 DIAGNOSIS — I69334 Monoplegia of upper limb following cerebral infarction affecting left non-dominant side: Secondary | ICD-10-CM | POA: Diagnosis not present

## 2018-07-25 DIAGNOSIS — E119 Type 2 diabetes mellitus without complications: Secondary | ICD-10-CM | POA: Diagnosis not present

## 2018-07-26 DIAGNOSIS — I10 Essential (primary) hypertension: Secondary | ICD-10-CM | POA: Diagnosis not present

## 2018-07-26 DIAGNOSIS — I69334 Monoplegia of upper limb following cerebral infarction affecting left non-dominant side: Secondary | ICD-10-CM | POA: Diagnosis not present

## 2018-07-26 DIAGNOSIS — I2510 Atherosclerotic heart disease of native coronary artery without angina pectoris: Secondary | ICD-10-CM | POA: Diagnosis not present

## 2018-07-26 DIAGNOSIS — M545 Low back pain: Secondary | ICD-10-CM | POA: Diagnosis not present

## 2018-07-26 DIAGNOSIS — E119 Type 2 diabetes mellitus without complications: Secondary | ICD-10-CM | POA: Diagnosis not present

## 2018-07-26 DIAGNOSIS — M1990 Unspecified osteoarthritis, unspecified site: Secondary | ICD-10-CM | POA: Diagnosis not present

## 2018-07-28 DIAGNOSIS — R413 Other amnesia: Secondary | ICD-10-CM | POA: Diagnosis not present

## 2018-07-28 DIAGNOSIS — I10 Essential (primary) hypertension: Secondary | ICD-10-CM | POA: Diagnosis not present

## 2018-07-28 DIAGNOSIS — Z6825 Body mass index (BMI) 25.0-25.9, adult: Secondary | ICD-10-CM | POA: Diagnosis not present

## 2018-07-28 DIAGNOSIS — E1122 Type 2 diabetes mellitus with diabetic chronic kidney disease: Secondary | ICD-10-CM | POA: Diagnosis not present

## 2018-07-28 DIAGNOSIS — I639 Cerebral infarction, unspecified: Secondary | ICD-10-CM | POA: Diagnosis not present

## 2018-07-28 DIAGNOSIS — N183 Chronic kidney disease, stage 3 (moderate): Secondary | ICD-10-CM | POA: Diagnosis not present

## 2018-07-28 DIAGNOSIS — F418 Other specified anxiety disorders: Secondary | ICD-10-CM | POA: Diagnosis not present

## 2018-07-28 DIAGNOSIS — Z299 Encounter for prophylactic measures, unspecified: Secondary | ICD-10-CM | POA: Diagnosis not present

## 2018-08-04 DIAGNOSIS — I358 Other nonrheumatic aortic valve disorders: Secondary | ICD-10-CM | POA: Diagnosis not present

## 2018-08-04 DIAGNOSIS — I6310 Cerebral infarction due to embolism of unspecified precerebral artery: Secondary | ICD-10-CM | POA: Diagnosis not present

## 2018-08-05 DIAGNOSIS — I639 Cerebral infarction, unspecified: Secondary | ICD-10-CM | POA: Diagnosis not present

## 2018-08-05 DIAGNOSIS — E1122 Type 2 diabetes mellitus with diabetic chronic kidney disease: Secondary | ICD-10-CM | POA: Diagnosis not present

## 2018-08-05 DIAGNOSIS — I10 Essential (primary) hypertension: Secondary | ICD-10-CM | POA: Diagnosis not present

## 2018-08-05 DIAGNOSIS — Z299 Encounter for prophylactic measures, unspecified: Secondary | ICD-10-CM | POA: Diagnosis not present

## 2018-08-05 DIAGNOSIS — E1165 Type 2 diabetes mellitus with hyperglycemia: Secondary | ICD-10-CM | POA: Diagnosis not present

## 2018-08-05 DIAGNOSIS — Z6825 Body mass index (BMI) 25.0-25.9, adult: Secondary | ICD-10-CM | POA: Diagnosis not present

## 2018-08-17 DIAGNOSIS — W57XXXA Bitten or stung by nonvenomous insect and other nonvenomous arthropods, initial encounter: Secondary | ICD-10-CM | POA: Diagnosis not present

## 2018-08-17 DIAGNOSIS — Z6825 Body mass index (BMI) 25.0-25.9, adult: Secondary | ICD-10-CM | POA: Diagnosis not present

## 2018-08-17 DIAGNOSIS — I639 Cerebral infarction, unspecified: Secondary | ICD-10-CM | POA: Diagnosis not present

## 2018-08-17 DIAGNOSIS — L089 Local infection of the skin and subcutaneous tissue, unspecified: Secondary | ICD-10-CM | POA: Diagnosis not present

## 2018-08-17 DIAGNOSIS — S30860A Insect bite (nonvenomous) of lower back and pelvis, initial encounter: Secondary | ICD-10-CM | POA: Diagnosis not present

## 2018-08-17 DIAGNOSIS — Z299 Encounter for prophylactic measures, unspecified: Secondary | ICD-10-CM | POA: Diagnosis not present

## 2018-08-29 DIAGNOSIS — E119 Type 2 diabetes mellitus without complications: Secondary | ICD-10-CM | POA: Diagnosis not present

## 2018-08-29 DIAGNOSIS — I10 Essential (primary) hypertension: Secondary | ICD-10-CM | POA: Diagnosis not present

## 2018-09-02 DIAGNOSIS — Z713 Dietary counseling and surveillance: Secondary | ICD-10-CM | POA: Diagnosis not present

## 2018-09-02 DIAGNOSIS — Z299 Encounter for prophylactic measures, unspecified: Secondary | ICD-10-CM | POA: Diagnosis not present

## 2018-09-02 DIAGNOSIS — I10 Essential (primary) hypertension: Secondary | ICD-10-CM | POA: Diagnosis not present

## 2018-09-02 DIAGNOSIS — Z6825 Body mass index (BMI) 25.0-25.9, adult: Secondary | ICD-10-CM | POA: Diagnosis not present

## 2018-09-02 DIAGNOSIS — I639 Cerebral infarction, unspecified: Secondary | ICD-10-CM | POA: Diagnosis not present

## 2018-09-26 DIAGNOSIS — E119 Type 2 diabetes mellitus without complications: Secondary | ICD-10-CM | POA: Diagnosis not present

## 2018-09-26 DIAGNOSIS — I10 Essential (primary) hypertension: Secondary | ICD-10-CM | POA: Diagnosis not present

## 2018-09-30 DIAGNOSIS — I639 Cerebral infarction, unspecified: Secondary | ICD-10-CM | POA: Diagnosis not present

## 2018-09-30 DIAGNOSIS — Z6825 Body mass index (BMI) 25.0-25.9, adult: Secondary | ICD-10-CM | POA: Diagnosis not present

## 2018-09-30 DIAGNOSIS — I10 Essential (primary) hypertension: Secondary | ICD-10-CM | POA: Diagnosis not present

## 2018-09-30 DIAGNOSIS — E1165 Type 2 diabetes mellitus with hyperglycemia: Secondary | ICD-10-CM | POA: Diagnosis not present

## 2018-09-30 DIAGNOSIS — M7552 Bursitis of left shoulder: Secondary | ICD-10-CM | POA: Diagnosis not present

## 2018-09-30 DIAGNOSIS — Z299 Encounter for prophylactic measures, unspecified: Secondary | ICD-10-CM | POA: Diagnosis not present

## 2018-10-12 DIAGNOSIS — W57XXXA Bitten or stung by nonvenomous insect and other nonvenomous arthropods, initial encounter: Secondary | ICD-10-CM | POA: Diagnosis not present

## 2018-10-12 DIAGNOSIS — Z299 Encounter for prophylactic measures, unspecified: Secondary | ICD-10-CM | POA: Diagnosis not present

## 2018-10-12 DIAGNOSIS — I10 Essential (primary) hypertension: Secondary | ICD-10-CM | POA: Diagnosis not present

## 2018-10-12 DIAGNOSIS — S90869A Insect bite (nonvenomous), unspecified foot, initial encounter: Secondary | ICD-10-CM | POA: Diagnosis not present

## 2018-10-12 DIAGNOSIS — E119 Type 2 diabetes mellitus without complications: Secondary | ICD-10-CM | POA: Diagnosis not present

## 2018-10-12 DIAGNOSIS — Z6826 Body mass index (BMI) 26.0-26.9, adult: Secondary | ICD-10-CM | POA: Diagnosis not present

## 2018-10-12 DIAGNOSIS — E1122 Type 2 diabetes mellitus with diabetic chronic kidney disease: Secondary | ICD-10-CM | POA: Diagnosis not present

## 2018-10-12 DIAGNOSIS — I639 Cerebral infarction, unspecified: Secondary | ICD-10-CM | POA: Diagnosis not present

## 2018-10-20 DIAGNOSIS — E1142 Type 2 diabetes mellitus with diabetic polyneuropathy: Secondary | ICD-10-CM | POA: Diagnosis not present

## 2018-10-20 DIAGNOSIS — I6930 Unspecified sequelae of cerebral infarction: Secondary | ICD-10-CM | POA: Diagnosis not present

## 2018-10-20 DIAGNOSIS — I2510 Atherosclerotic heart disease of native coronary artery without angina pectoris: Secondary | ICD-10-CM | POA: Diagnosis not present

## 2018-10-28 DIAGNOSIS — Z299 Encounter for prophylactic measures, unspecified: Secondary | ICD-10-CM | POA: Diagnosis not present

## 2018-10-28 DIAGNOSIS — Z6825 Body mass index (BMI) 25.0-25.9, adult: Secondary | ICD-10-CM | POA: Diagnosis not present

## 2018-10-28 DIAGNOSIS — I10 Essential (primary) hypertension: Secondary | ICD-10-CM | POA: Diagnosis not present

## 2018-10-28 DIAGNOSIS — Z713 Dietary counseling and surveillance: Secondary | ICD-10-CM | POA: Diagnosis not present

## 2018-10-28 DIAGNOSIS — I639 Cerebral infarction, unspecified: Secondary | ICD-10-CM | POA: Diagnosis not present

## 2018-10-31 DIAGNOSIS — M4807 Spinal stenosis, lumbosacral region: Secondary | ICD-10-CM | POA: Diagnosis not present

## 2018-10-31 DIAGNOSIS — M5136 Other intervertebral disc degeneration, lumbar region: Secondary | ICD-10-CM | POA: Diagnosis not present

## 2018-10-31 DIAGNOSIS — M545 Low back pain: Secondary | ICD-10-CM | POA: Diagnosis not present

## 2018-10-31 DIAGNOSIS — Z79891 Long term (current) use of opiate analgesic: Secondary | ICD-10-CM | POA: Diagnosis not present

## 2018-11-14 DIAGNOSIS — Z0000 Encounter for general adult medical examination without abnormal findings: Secondary | ICD-10-CM | POA: Diagnosis not present

## 2018-11-14 DIAGNOSIS — Z7189 Other specified counseling: Secondary | ICD-10-CM | POA: Diagnosis not present

## 2018-11-14 DIAGNOSIS — Z6825 Body mass index (BMI) 25.0-25.9, adult: Secondary | ICD-10-CM | POA: Diagnosis not present

## 2018-11-14 DIAGNOSIS — Z299 Encounter for prophylactic measures, unspecified: Secondary | ICD-10-CM | POA: Diagnosis not present

## 2018-11-14 DIAGNOSIS — I10 Essential (primary) hypertension: Secondary | ICD-10-CM | POA: Diagnosis not present

## 2018-11-14 DIAGNOSIS — E7800 Pure hypercholesterolemia, unspecified: Secondary | ICD-10-CM | POA: Diagnosis not present

## 2018-11-14 DIAGNOSIS — E559 Vitamin D deficiency, unspecified: Secondary | ICD-10-CM | POA: Diagnosis not present

## 2018-11-14 DIAGNOSIS — Z79899 Other long term (current) drug therapy: Secondary | ICD-10-CM | POA: Diagnosis not present

## 2018-11-14 DIAGNOSIS — Z1339 Encounter for screening examination for other mental health and behavioral disorders: Secondary | ICD-10-CM | POA: Diagnosis not present

## 2018-11-14 DIAGNOSIS — R5383 Other fatigue: Secondary | ICD-10-CM | POA: Diagnosis not present

## 2018-11-14 DIAGNOSIS — Z1331 Encounter for screening for depression: Secondary | ICD-10-CM | POA: Diagnosis not present

## 2018-11-14 DIAGNOSIS — Z1211 Encounter for screening for malignant neoplasm of colon: Secondary | ICD-10-CM | POA: Diagnosis not present

## 2018-11-16 DIAGNOSIS — E119 Type 2 diabetes mellitus without complications: Secondary | ICD-10-CM | POA: Diagnosis not present

## 2018-11-16 DIAGNOSIS — I10 Essential (primary) hypertension: Secondary | ICD-10-CM | POA: Diagnosis not present

## 2018-11-28 DIAGNOSIS — Z6825 Body mass index (BMI) 25.0-25.9, adult: Secondary | ICD-10-CM | POA: Diagnosis not present

## 2018-11-28 DIAGNOSIS — E1122 Type 2 diabetes mellitus with diabetic chronic kidney disease: Secondary | ICD-10-CM | POA: Diagnosis not present

## 2018-11-28 DIAGNOSIS — Z299 Encounter for prophylactic measures, unspecified: Secondary | ICD-10-CM | POA: Diagnosis not present

## 2018-11-28 DIAGNOSIS — E1129 Type 2 diabetes mellitus with other diabetic kidney complication: Secondary | ICD-10-CM | POA: Diagnosis not present

## 2018-11-28 DIAGNOSIS — F339 Major depressive disorder, recurrent, unspecified: Secondary | ICD-10-CM | POA: Diagnosis not present

## 2018-11-28 DIAGNOSIS — M549 Dorsalgia, unspecified: Secondary | ICD-10-CM | POA: Diagnosis not present

## 2018-11-28 DIAGNOSIS — I639 Cerebral infarction, unspecified: Secondary | ICD-10-CM | POA: Diagnosis not present

## 2018-12-05 ENCOUNTER — Other Ambulatory Visit: Payer: Self-pay

## 2018-12-05 ENCOUNTER — Other Ambulatory Visit: Payer: Medicare Other

## 2018-12-05 DIAGNOSIS — Z20822 Contact with and (suspected) exposure to covid-19: Secondary | ICD-10-CM

## 2018-12-05 DIAGNOSIS — R6889 Other general symptoms and signs: Secondary | ICD-10-CM | POA: Diagnosis not present

## 2018-12-07 LAB — NOVEL CORONAVIRUS, NAA: SARS-CoV-2, NAA: NOT DETECTED

## 2018-12-12 ENCOUNTER — Telehealth: Payer: Self-pay | Admitting: Internal Medicine

## 2018-12-12 NOTE — Telephone Encounter (Signed)
Pt returned call for covid results, negative; verbalizes understanding. 

## 2019-01-04 DIAGNOSIS — Z299 Encounter for prophylactic measures, unspecified: Secondary | ICD-10-CM | POA: Diagnosis not present

## 2019-01-04 DIAGNOSIS — E1165 Type 2 diabetes mellitus with hyperglycemia: Secondary | ICD-10-CM | POA: Diagnosis not present

## 2019-01-04 DIAGNOSIS — I10 Essential (primary) hypertension: Secondary | ICD-10-CM | POA: Diagnosis not present

## 2019-01-04 DIAGNOSIS — M791 Myalgia, unspecified site: Secondary | ICD-10-CM | POA: Diagnosis not present

## 2019-01-04 DIAGNOSIS — Z6826 Body mass index (BMI) 26.0-26.9, adult: Secondary | ICD-10-CM | POA: Diagnosis not present

## 2019-01-04 DIAGNOSIS — I639 Cerebral infarction, unspecified: Secondary | ICD-10-CM | POA: Diagnosis not present

## 2019-01-09 DIAGNOSIS — E8940 Asymptomatic postprocedural ovarian failure: Secondary | ICD-10-CM | POA: Diagnosis not present

## 2019-01-30 DIAGNOSIS — H524 Presbyopia: Secondary | ICD-10-CM | POA: Diagnosis not present

## 2019-01-30 DIAGNOSIS — Z9849 Cataract extraction status, unspecified eye: Secondary | ICD-10-CM | POA: Diagnosis not present

## 2019-01-30 DIAGNOSIS — H5201 Hypermetropia, right eye: Secondary | ICD-10-CM | POA: Diagnosis not present

## 2019-01-30 DIAGNOSIS — Z961 Presence of intraocular lens: Secondary | ICD-10-CM | POA: Diagnosis not present

## 2019-01-30 DIAGNOSIS — H35451 Secondary pigmentary degeneration, right eye: Secondary | ICD-10-CM | POA: Diagnosis not present

## 2019-01-30 DIAGNOSIS — H52221 Regular astigmatism, right eye: Secondary | ICD-10-CM | POA: Diagnosis not present

## 2019-01-30 DIAGNOSIS — H5000 Unspecified esotropia: Secondary | ICD-10-CM | POA: Diagnosis not present

## 2019-01-30 DIAGNOSIS — H5089 Other specified strabismus: Secondary | ICD-10-CM | POA: Diagnosis not present

## 2019-01-30 DIAGNOSIS — E119 Type 2 diabetes mellitus without complications: Secondary | ICD-10-CM | POA: Diagnosis not present

## 2019-02-01 DIAGNOSIS — G894 Chronic pain syndrome: Secondary | ICD-10-CM | POA: Diagnosis not present

## 2019-02-01 DIAGNOSIS — Z5181 Encounter for therapeutic drug level monitoring: Secondary | ICD-10-CM | POA: Diagnosis not present

## 2019-02-01 DIAGNOSIS — M47812 Spondylosis without myelopathy or radiculopathy, cervical region: Secondary | ICD-10-CM | POA: Diagnosis not present

## 2019-02-01 DIAGNOSIS — M5416 Radiculopathy, lumbar region: Secondary | ICD-10-CM | POA: Diagnosis not present

## 2019-02-01 DIAGNOSIS — M4807 Spinal stenosis, lumbosacral region: Secondary | ICD-10-CM | POA: Diagnosis not present

## 2019-02-01 DIAGNOSIS — Z79891 Long term (current) use of opiate analgesic: Secondary | ICD-10-CM | POA: Diagnosis not present

## 2019-02-03 DIAGNOSIS — Z6824 Body mass index (BMI) 24.0-24.9, adult: Secondary | ICD-10-CM | POA: Diagnosis not present

## 2019-02-03 DIAGNOSIS — N183 Chronic kidney disease, stage 3 (moderate): Secondary | ICD-10-CM | POA: Diagnosis not present

## 2019-02-03 DIAGNOSIS — I10 Essential (primary) hypertension: Secondary | ICD-10-CM | POA: Diagnosis not present

## 2019-02-03 DIAGNOSIS — Z299 Encounter for prophylactic measures, unspecified: Secondary | ICD-10-CM | POA: Diagnosis not present

## 2019-02-03 DIAGNOSIS — Z23 Encounter for immunization: Secondary | ICD-10-CM | POA: Diagnosis not present

## 2019-02-03 DIAGNOSIS — E1122 Type 2 diabetes mellitus with diabetic chronic kidney disease: Secondary | ICD-10-CM | POA: Diagnosis not present

## 2019-02-03 DIAGNOSIS — I639 Cerebral infarction, unspecified: Secondary | ICD-10-CM | POA: Diagnosis not present

## 2019-02-03 DIAGNOSIS — F339 Major depressive disorder, recurrent, unspecified: Secondary | ICD-10-CM | POA: Diagnosis not present

## 2019-02-08 DIAGNOSIS — I10 Essential (primary) hypertension: Secondary | ICD-10-CM | POA: Diagnosis not present

## 2019-02-13 DIAGNOSIS — M549 Dorsalgia, unspecified: Secondary | ICD-10-CM | POA: Diagnosis not present

## 2019-02-13 DIAGNOSIS — M47812 Spondylosis without myelopathy or radiculopathy, cervical region: Secondary | ICD-10-CM | POA: Diagnosis not present

## 2019-02-13 DIAGNOSIS — M4807 Spinal stenosis, lumbosacral region: Secondary | ICD-10-CM | POA: Diagnosis not present

## 2019-02-13 DIAGNOSIS — M6281 Muscle weakness (generalized): Secondary | ICD-10-CM | POA: Diagnosis not present

## 2019-02-13 DIAGNOSIS — M2560 Stiffness of unspecified joint, not elsewhere classified: Secondary | ICD-10-CM | POA: Diagnosis not present

## 2019-02-13 DIAGNOSIS — M5416 Radiculopathy, lumbar region: Secondary | ICD-10-CM | POA: Diagnosis not present

## 2019-02-14 DIAGNOSIS — M5416 Radiculopathy, lumbar region: Secondary | ICD-10-CM | POA: Diagnosis not present

## 2019-02-14 DIAGNOSIS — R262 Difficulty in walking, not elsewhere classified: Secondary | ICD-10-CM | POA: Diagnosis not present

## 2019-02-14 DIAGNOSIS — M47812 Spondylosis without myelopathy or radiculopathy, cervical region: Secondary | ICD-10-CM | POA: Diagnosis not present

## 2019-02-14 DIAGNOSIS — M6281 Muscle weakness (generalized): Secondary | ICD-10-CM | POA: Diagnosis not present

## 2019-02-14 DIAGNOSIS — M4807 Spinal stenosis, lumbosacral region: Secondary | ICD-10-CM | POA: Diagnosis not present

## 2019-02-14 DIAGNOSIS — M2560 Stiffness of unspecified joint, not elsewhere classified: Secondary | ICD-10-CM | POA: Diagnosis not present

## 2019-02-14 DIAGNOSIS — M549 Dorsalgia, unspecified: Secondary | ICD-10-CM | POA: Diagnosis not present

## 2019-02-15 DIAGNOSIS — Z299 Encounter for prophylactic measures, unspecified: Secondary | ICD-10-CM | POA: Diagnosis not present

## 2019-02-15 DIAGNOSIS — Z713 Dietary counseling and surveillance: Secondary | ICD-10-CM | POA: Diagnosis not present

## 2019-02-15 DIAGNOSIS — Z6826 Body mass index (BMI) 26.0-26.9, adult: Secondary | ICD-10-CM | POA: Diagnosis not present

## 2019-02-15 DIAGNOSIS — I10 Essential (primary) hypertension: Secondary | ICD-10-CM | POA: Diagnosis not present

## 2019-02-15 DIAGNOSIS — I639 Cerebral infarction, unspecified: Secondary | ICD-10-CM | POA: Diagnosis not present

## 2019-02-16 DIAGNOSIS — M48061 Spinal stenosis, lumbar region without neurogenic claudication: Secondary | ICD-10-CM | POA: Diagnosis not present

## 2019-02-16 DIAGNOSIS — M5136 Other intervertebral disc degeneration, lumbar region: Secondary | ICD-10-CM | POA: Diagnosis not present

## 2019-02-17 DIAGNOSIS — M799 Soft tissue disorder, unspecified: Secondary | ICD-10-CM | POA: Diagnosis not present

## 2019-02-17 DIAGNOSIS — M4807 Spinal stenosis, lumbosacral region: Secondary | ICD-10-CM | POA: Diagnosis not present

## 2019-02-17 DIAGNOSIS — M2560 Stiffness of unspecified joint, not elsewhere classified: Secondary | ICD-10-CM | POA: Diagnosis not present

## 2019-02-17 DIAGNOSIS — M47812 Spondylosis without myelopathy or radiculopathy, cervical region: Secondary | ICD-10-CM | POA: Diagnosis not present

## 2019-02-17 DIAGNOSIS — M5416 Radiculopathy, lumbar region: Secondary | ICD-10-CM | POA: Diagnosis not present

## 2019-02-17 DIAGNOSIS — M549 Dorsalgia, unspecified: Secondary | ICD-10-CM | POA: Diagnosis not present

## 2019-02-17 DIAGNOSIS — M6281 Muscle weakness (generalized): Secondary | ICD-10-CM | POA: Diagnosis not present

## 2019-02-17 DIAGNOSIS — R262 Difficulty in walking, not elsewhere classified: Secondary | ICD-10-CM | POA: Diagnosis not present

## 2019-02-20 DIAGNOSIS — M5416 Radiculopathy, lumbar region: Secondary | ICD-10-CM | POA: Diagnosis not present

## 2019-02-20 DIAGNOSIS — M2560 Stiffness of unspecified joint, not elsewhere classified: Secondary | ICD-10-CM | POA: Diagnosis not present

## 2019-02-20 DIAGNOSIS — R262 Difficulty in walking, not elsewhere classified: Secondary | ICD-10-CM | POA: Diagnosis not present

## 2019-02-20 DIAGNOSIS — M799 Soft tissue disorder, unspecified: Secondary | ICD-10-CM | POA: Diagnosis not present

## 2019-02-20 DIAGNOSIS — M4807 Spinal stenosis, lumbosacral region: Secondary | ICD-10-CM | POA: Diagnosis not present

## 2019-02-20 DIAGNOSIS — M47812 Spondylosis without myelopathy or radiculopathy, cervical region: Secondary | ICD-10-CM | POA: Diagnosis not present

## 2019-02-20 DIAGNOSIS — M549 Dorsalgia, unspecified: Secondary | ICD-10-CM | POA: Diagnosis not present

## 2019-02-20 DIAGNOSIS — M6281 Muscle weakness (generalized): Secondary | ICD-10-CM | POA: Diagnosis not present

## 2019-02-21 DIAGNOSIS — M4807 Spinal stenosis, lumbosacral region: Secondary | ICD-10-CM | POA: Diagnosis not present

## 2019-02-21 DIAGNOSIS — M799 Soft tissue disorder, unspecified: Secondary | ICD-10-CM | POA: Diagnosis not present

## 2019-02-21 DIAGNOSIS — R262 Difficulty in walking, not elsewhere classified: Secondary | ICD-10-CM | POA: Diagnosis not present

## 2019-02-21 DIAGNOSIS — M47812 Spondylosis without myelopathy or radiculopathy, cervical region: Secondary | ICD-10-CM | POA: Diagnosis not present

## 2019-02-21 DIAGNOSIS — M6281 Muscle weakness (generalized): Secondary | ICD-10-CM | POA: Diagnosis not present

## 2019-02-21 DIAGNOSIS — M5416 Radiculopathy, lumbar region: Secondary | ICD-10-CM | POA: Diagnosis not present

## 2019-02-21 DIAGNOSIS — M549 Dorsalgia, unspecified: Secondary | ICD-10-CM | POA: Diagnosis not present

## 2019-02-21 DIAGNOSIS — M2560 Stiffness of unspecified joint, not elsewhere classified: Secondary | ICD-10-CM | POA: Diagnosis not present

## 2019-02-22 DIAGNOSIS — M6281 Muscle weakness (generalized): Secondary | ICD-10-CM | POA: Diagnosis not present

## 2019-02-22 DIAGNOSIS — M799 Soft tissue disorder, unspecified: Secondary | ICD-10-CM | POA: Diagnosis not present

## 2019-02-22 DIAGNOSIS — M5416 Radiculopathy, lumbar region: Secondary | ICD-10-CM | POA: Diagnosis not present

## 2019-02-22 DIAGNOSIS — M47812 Spondylosis without myelopathy or radiculopathy, cervical region: Secondary | ICD-10-CM | POA: Diagnosis not present

## 2019-02-22 DIAGNOSIS — M549 Dorsalgia, unspecified: Secondary | ICD-10-CM | POA: Diagnosis not present

## 2019-02-22 DIAGNOSIS — M2560 Stiffness of unspecified joint, not elsewhere classified: Secondary | ICD-10-CM | POA: Diagnosis not present

## 2019-02-22 DIAGNOSIS — R262 Difficulty in walking, not elsewhere classified: Secondary | ICD-10-CM | POA: Diagnosis not present

## 2019-02-22 DIAGNOSIS — M4807 Spinal stenosis, lumbosacral region: Secondary | ICD-10-CM | POA: Diagnosis not present

## 2019-02-23 DIAGNOSIS — R262 Difficulty in walking, not elsewhere classified: Secondary | ICD-10-CM | POA: Diagnosis not present

## 2019-02-23 DIAGNOSIS — M2560 Stiffness of unspecified joint, not elsewhere classified: Secondary | ICD-10-CM | POA: Diagnosis not present

## 2019-02-23 DIAGNOSIS — Z713 Dietary counseling and surveillance: Secondary | ICD-10-CM | POA: Diagnosis not present

## 2019-02-23 DIAGNOSIS — M6281 Muscle weakness (generalized): Secondary | ICD-10-CM | POA: Diagnosis not present

## 2019-02-23 DIAGNOSIS — M799 Soft tissue disorder, unspecified: Secondary | ICD-10-CM | POA: Diagnosis not present

## 2019-02-23 DIAGNOSIS — M47812 Spondylosis without myelopathy or radiculopathy, cervical region: Secondary | ICD-10-CM | POA: Diagnosis not present

## 2019-02-23 DIAGNOSIS — M4807 Spinal stenosis, lumbosacral region: Secondary | ICD-10-CM | POA: Diagnosis not present

## 2019-02-23 DIAGNOSIS — Z299 Encounter for prophylactic measures, unspecified: Secondary | ICD-10-CM | POA: Diagnosis not present

## 2019-02-23 DIAGNOSIS — I639 Cerebral infarction, unspecified: Secondary | ICD-10-CM | POA: Diagnosis not present

## 2019-02-23 DIAGNOSIS — M5416 Radiculopathy, lumbar region: Secondary | ICD-10-CM | POA: Diagnosis not present

## 2019-02-23 DIAGNOSIS — I10 Essential (primary) hypertension: Secondary | ICD-10-CM | POA: Diagnosis not present

## 2019-02-23 DIAGNOSIS — Z6826 Body mass index (BMI) 26.0-26.9, adult: Secondary | ICD-10-CM | POA: Diagnosis not present

## 2019-02-23 DIAGNOSIS — M549 Dorsalgia, unspecified: Secondary | ICD-10-CM | POA: Diagnosis not present

## 2019-02-27 DIAGNOSIS — M2560 Stiffness of unspecified joint, not elsewhere classified: Secondary | ICD-10-CM | POA: Diagnosis not present

## 2019-02-27 DIAGNOSIS — M549 Dorsalgia, unspecified: Secondary | ICD-10-CM | POA: Diagnosis not present

## 2019-02-27 DIAGNOSIS — M4807 Spinal stenosis, lumbosacral region: Secondary | ICD-10-CM | POA: Diagnosis not present

## 2019-02-27 DIAGNOSIS — M5416 Radiculopathy, lumbar region: Secondary | ICD-10-CM | POA: Diagnosis not present

## 2019-02-27 DIAGNOSIS — M47812 Spondylosis without myelopathy or radiculopathy, cervical region: Secondary | ICD-10-CM | POA: Diagnosis not present

## 2019-02-27 DIAGNOSIS — R262 Difficulty in walking, not elsewhere classified: Secondary | ICD-10-CM | POA: Diagnosis not present

## 2019-02-27 DIAGNOSIS — M799 Soft tissue disorder, unspecified: Secondary | ICD-10-CM | POA: Diagnosis not present

## 2019-02-27 DIAGNOSIS — M6281 Muscle weakness (generalized): Secondary | ICD-10-CM | POA: Diagnosis not present

## 2019-02-28 DIAGNOSIS — M799 Soft tissue disorder, unspecified: Secondary | ICD-10-CM | POA: Diagnosis not present

## 2019-02-28 DIAGNOSIS — M4807 Spinal stenosis, lumbosacral region: Secondary | ICD-10-CM | POA: Diagnosis not present

## 2019-02-28 DIAGNOSIS — M549 Dorsalgia, unspecified: Secondary | ICD-10-CM | POA: Diagnosis not present

## 2019-02-28 DIAGNOSIS — R262 Difficulty in walking, not elsewhere classified: Secondary | ICD-10-CM | POA: Diagnosis not present

## 2019-02-28 DIAGNOSIS — M2560 Stiffness of unspecified joint, not elsewhere classified: Secondary | ICD-10-CM | POA: Diagnosis not present

## 2019-02-28 DIAGNOSIS — M6281 Muscle weakness (generalized): Secondary | ICD-10-CM | POA: Diagnosis not present

## 2019-02-28 DIAGNOSIS — M47812 Spondylosis without myelopathy or radiculopathy, cervical region: Secondary | ICD-10-CM | POA: Diagnosis not present

## 2019-02-28 DIAGNOSIS — M5416 Radiculopathy, lumbar region: Secondary | ICD-10-CM | POA: Diagnosis not present

## 2019-03-06 DIAGNOSIS — M5416 Radiculopathy, lumbar region: Secondary | ICD-10-CM | POA: Diagnosis not present

## 2019-03-06 DIAGNOSIS — M799 Soft tissue disorder, unspecified: Secondary | ICD-10-CM | POA: Diagnosis not present

## 2019-03-06 DIAGNOSIS — M4807 Spinal stenosis, lumbosacral region: Secondary | ICD-10-CM | POA: Diagnosis not present

## 2019-03-06 DIAGNOSIS — M2560 Stiffness of unspecified joint, not elsewhere classified: Secondary | ICD-10-CM | POA: Diagnosis not present

## 2019-03-06 DIAGNOSIS — M549 Dorsalgia, unspecified: Secondary | ICD-10-CM | POA: Diagnosis not present

## 2019-03-06 DIAGNOSIS — M6281 Muscle weakness (generalized): Secondary | ICD-10-CM | POA: Diagnosis not present

## 2019-03-06 DIAGNOSIS — R262 Difficulty in walking, not elsewhere classified: Secondary | ICD-10-CM | POA: Diagnosis not present

## 2019-03-06 DIAGNOSIS — M47812 Spondylosis without myelopathy or radiculopathy, cervical region: Secondary | ICD-10-CM | POA: Diagnosis not present

## 2019-03-08 DIAGNOSIS — M4807 Spinal stenosis, lumbosacral region: Secondary | ICD-10-CM | POA: Diagnosis not present

## 2019-03-08 DIAGNOSIS — M799 Soft tissue disorder, unspecified: Secondary | ICD-10-CM | POA: Diagnosis not present

## 2019-03-08 DIAGNOSIS — M6281 Muscle weakness (generalized): Secondary | ICD-10-CM | POA: Diagnosis not present

## 2019-03-08 DIAGNOSIS — M5416 Radiculopathy, lumbar region: Secondary | ICD-10-CM | POA: Diagnosis not present

## 2019-03-08 DIAGNOSIS — R262 Difficulty in walking, not elsewhere classified: Secondary | ICD-10-CM | POA: Diagnosis not present

## 2019-03-08 DIAGNOSIS — M47812 Spondylosis without myelopathy or radiculopathy, cervical region: Secondary | ICD-10-CM | POA: Diagnosis not present

## 2019-03-08 DIAGNOSIS — M2560 Stiffness of unspecified joint, not elsewhere classified: Secondary | ICD-10-CM | POA: Diagnosis not present

## 2019-03-08 DIAGNOSIS — M549 Dorsalgia, unspecified: Secondary | ICD-10-CM | POA: Diagnosis not present

## 2019-03-09 DIAGNOSIS — Z1231 Encounter for screening mammogram for malignant neoplasm of breast: Secondary | ICD-10-CM | POA: Diagnosis not present

## 2019-03-10 DIAGNOSIS — Z299 Encounter for prophylactic measures, unspecified: Secondary | ICD-10-CM | POA: Diagnosis not present

## 2019-03-10 DIAGNOSIS — Z713 Dietary counseling and surveillance: Secondary | ICD-10-CM | POA: Diagnosis not present

## 2019-03-10 DIAGNOSIS — I639 Cerebral infarction, unspecified: Secondary | ICD-10-CM | POA: Diagnosis not present

## 2019-03-10 DIAGNOSIS — Z6826 Body mass index (BMI) 26.0-26.9, adult: Secondary | ICD-10-CM | POA: Diagnosis not present

## 2019-03-10 DIAGNOSIS — I10 Essential (primary) hypertension: Secondary | ICD-10-CM | POA: Diagnosis not present

## 2019-03-13 DIAGNOSIS — M799 Soft tissue disorder, unspecified: Secondary | ICD-10-CM | POA: Diagnosis not present

## 2019-03-13 DIAGNOSIS — M6281 Muscle weakness (generalized): Secondary | ICD-10-CM | POA: Diagnosis not present

## 2019-03-13 DIAGNOSIS — R262 Difficulty in walking, not elsewhere classified: Secondary | ICD-10-CM | POA: Diagnosis not present

## 2019-03-13 DIAGNOSIS — M47812 Spondylosis without myelopathy or radiculopathy, cervical region: Secondary | ICD-10-CM | POA: Diagnosis not present

## 2019-03-13 DIAGNOSIS — M2560 Stiffness of unspecified joint, not elsewhere classified: Secondary | ICD-10-CM | POA: Diagnosis not present

## 2019-03-13 DIAGNOSIS — M549 Dorsalgia, unspecified: Secondary | ICD-10-CM | POA: Diagnosis not present

## 2019-03-13 DIAGNOSIS — M5416 Radiculopathy, lumbar region: Secondary | ICD-10-CM | POA: Diagnosis not present

## 2019-03-13 DIAGNOSIS — M4807 Spinal stenosis, lumbosacral region: Secondary | ICD-10-CM | POA: Diagnosis not present

## 2019-03-14 DIAGNOSIS — E7800 Pure hypercholesterolemia, unspecified: Secondary | ICD-10-CM | POA: Diagnosis not present

## 2019-03-14 DIAGNOSIS — F419 Anxiety disorder, unspecified: Secondary | ICD-10-CM | POA: Diagnosis not present

## 2019-03-14 DIAGNOSIS — Z299 Encounter for prophylactic measures, unspecified: Secondary | ICD-10-CM | POA: Diagnosis not present

## 2019-03-14 DIAGNOSIS — I10 Essential (primary) hypertension: Secondary | ICD-10-CM | POA: Diagnosis not present

## 2019-03-14 DIAGNOSIS — G459 Transient cerebral ischemic attack, unspecified: Secondary | ICD-10-CM | POA: Diagnosis not present

## 2019-03-14 DIAGNOSIS — Z6825 Body mass index (BMI) 25.0-25.9, adult: Secondary | ICD-10-CM | POA: Diagnosis not present

## 2019-03-20 DIAGNOSIS — G459 Transient cerebral ischemic attack, unspecified: Secondary | ICD-10-CM | POA: Diagnosis not present

## 2019-03-21 DIAGNOSIS — M799 Soft tissue disorder, unspecified: Secondary | ICD-10-CM | POA: Diagnosis not present

## 2019-03-21 DIAGNOSIS — M47812 Spondylosis without myelopathy or radiculopathy, cervical region: Secondary | ICD-10-CM | POA: Diagnosis not present

## 2019-03-21 DIAGNOSIS — M6281 Muscle weakness (generalized): Secondary | ICD-10-CM | POA: Diagnosis not present

## 2019-03-21 DIAGNOSIS — M2560 Stiffness of unspecified joint, not elsewhere classified: Secondary | ICD-10-CM | POA: Diagnosis not present

## 2019-03-21 DIAGNOSIS — R262 Difficulty in walking, not elsewhere classified: Secondary | ICD-10-CM | POA: Diagnosis not present

## 2019-03-21 DIAGNOSIS — M549 Dorsalgia, unspecified: Secondary | ICD-10-CM | POA: Diagnosis not present

## 2019-03-21 DIAGNOSIS — M4807 Spinal stenosis, lumbosacral region: Secondary | ICD-10-CM | POA: Diagnosis not present

## 2019-03-21 DIAGNOSIS — M5416 Radiculopathy, lumbar region: Secondary | ICD-10-CM | POA: Diagnosis not present

## 2019-03-29 DIAGNOSIS — M4807 Spinal stenosis, lumbosacral region: Secondary | ICD-10-CM | POA: Diagnosis not present

## 2019-03-29 DIAGNOSIS — M5416 Radiculopathy, lumbar region: Secondary | ICD-10-CM | POA: Diagnosis not present

## 2019-03-29 DIAGNOSIS — M799 Soft tissue disorder, unspecified: Secondary | ICD-10-CM | POA: Diagnosis not present

## 2019-03-29 DIAGNOSIS — M2560 Stiffness of unspecified joint, not elsewhere classified: Secondary | ICD-10-CM | POA: Diagnosis not present

## 2019-03-29 DIAGNOSIS — M549 Dorsalgia, unspecified: Secondary | ICD-10-CM | POA: Diagnosis not present

## 2019-03-29 DIAGNOSIS — M47812 Spondylosis without myelopathy or radiculopathy, cervical region: Secondary | ICD-10-CM | POA: Diagnosis not present

## 2019-03-29 DIAGNOSIS — M6281 Muscle weakness (generalized): Secondary | ICD-10-CM | POA: Diagnosis not present

## 2019-03-29 DIAGNOSIS — R262 Difficulty in walking, not elsewhere classified: Secondary | ICD-10-CM | POA: Diagnosis not present

## 2019-03-31 DIAGNOSIS — M2560 Stiffness of unspecified joint, not elsewhere classified: Secondary | ICD-10-CM | POA: Diagnosis not present

## 2019-03-31 DIAGNOSIS — M4807 Spinal stenosis, lumbosacral region: Secondary | ICD-10-CM | POA: Diagnosis not present

## 2019-03-31 DIAGNOSIS — M47812 Spondylosis without myelopathy or radiculopathy, cervical region: Secondary | ICD-10-CM | POA: Diagnosis not present

## 2019-03-31 DIAGNOSIS — M6281 Muscle weakness (generalized): Secondary | ICD-10-CM | POA: Diagnosis not present

## 2019-03-31 DIAGNOSIS — R262 Difficulty in walking, not elsewhere classified: Secondary | ICD-10-CM | POA: Diagnosis not present

## 2019-03-31 DIAGNOSIS — M799 Soft tissue disorder, unspecified: Secondary | ICD-10-CM | POA: Diagnosis not present

## 2019-03-31 DIAGNOSIS — M549 Dorsalgia, unspecified: Secondary | ICD-10-CM | POA: Diagnosis not present

## 2019-03-31 DIAGNOSIS — M5416 Radiculopathy, lumbar region: Secondary | ICD-10-CM | POA: Diagnosis not present

## 2019-04-02 DIAGNOSIS — M47812 Spondylosis without myelopathy or radiculopathy, cervical region: Secondary | ICD-10-CM | POA: Diagnosis not present

## 2019-04-02 DIAGNOSIS — M5416 Radiculopathy, lumbar region: Secondary | ICD-10-CM | POA: Diagnosis not present

## 2019-04-02 DIAGNOSIS — R262 Difficulty in walking, not elsewhere classified: Secondary | ICD-10-CM | POA: Diagnosis not present

## 2019-04-02 DIAGNOSIS — M6281 Muscle weakness (generalized): Secondary | ICD-10-CM | POA: Diagnosis not present

## 2019-04-02 DIAGNOSIS — M549 Dorsalgia, unspecified: Secondary | ICD-10-CM | POA: Diagnosis not present

## 2019-04-02 DIAGNOSIS — M799 Soft tissue disorder, unspecified: Secondary | ICD-10-CM | POA: Diagnosis not present

## 2019-04-02 DIAGNOSIS — M2560 Stiffness of unspecified joint, not elsewhere classified: Secondary | ICD-10-CM | POA: Diagnosis not present

## 2019-04-02 DIAGNOSIS — M4807 Spinal stenosis, lumbosacral region: Secondary | ICD-10-CM | POA: Diagnosis not present

## 2019-04-04 DIAGNOSIS — M47812 Spondylosis without myelopathy or radiculopathy, cervical region: Secondary | ICD-10-CM | POA: Diagnosis not present

## 2019-04-04 DIAGNOSIS — M549 Dorsalgia, unspecified: Secondary | ICD-10-CM | POA: Diagnosis not present

## 2019-04-04 DIAGNOSIS — M4807 Spinal stenosis, lumbosacral region: Secondary | ICD-10-CM | POA: Diagnosis not present

## 2019-04-04 DIAGNOSIS — R262 Difficulty in walking, not elsewhere classified: Secondary | ICD-10-CM | POA: Diagnosis not present

## 2019-04-04 DIAGNOSIS — M5416 Radiculopathy, lumbar region: Secondary | ICD-10-CM | POA: Diagnosis not present

## 2019-04-04 DIAGNOSIS — M2560 Stiffness of unspecified joint, not elsewhere classified: Secondary | ICD-10-CM | POA: Diagnosis not present

## 2019-04-04 DIAGNOSIS — M799 Soft tissue disorder, unspecified: Secondary | ICD-10-CM | POA: Diagnosis not present

## 2019-04-04 DIAGNOSIS — M6281 Muscle weakness (generalized): Secondary | ICD-10-CM | POA: Diagnosis not present

## 2019-04-19 DIAGNOSIS — I2510 Atherosclerotic heart disease of native coronary artery without angina pectoris: Secondary | ICD-10-CM | POA: Diagnosis not present

## 2019-05-12 HISTORY — PX: CERVICAL SPINE SURGERY: SHX589

## 2019-06-01 DIAGNOSIS — F339 Major depressive disorder, recurrent, unspecified: Secondary | ICD-10-CM | POA: Diagnosis not present

## 2019-06-01 DIAGNOSIS — I639 Cerebral infarction, unspecified: Secondary | ICD-10-CM | POA: Diagnosis not present

## 2019-06-01 DIAGNOSIS — Z6824 Body mass index (BMI) 24.0-24.9, adult: Secondary | ICD-10-CM | POA: Diagnosis not present

## 2019-06-01 DIAGNOSIS — M4802 Spinal stenosis, cervical region: Secondary | ICD-10-CM | POA: Diagnosis not present

## 2019-06-01 DIAGNOSIS — M542 Cervicalgia: Secondary | ICD-10-CM | POA: Diagnosis not present

## 2019-06-01 DIAGNOSIS — Z299 Encounter for prophylactic measures, unspecified: Secondary | ICD-10-CM | POA: Diagnosis not present

## 2019-06-01 DIAGNOSIS — M47812 Spondylosis without myelopathy or radiculopathy, cervical region: Secondary | ICD-10-CM | POA: Diagnosis not present

## 2019-06-01 DIAGNOSIS — I10 Essential (primary) hypertension: Secondary | ICD-10-CM | POA: Diagnosis not present

## 2019-06-01 DIAGNOSIS — R202 Paresthesia of skin: Secondary | ICD-10-CM | POA: Diagnosis not present

## 2019-06-01 DIAGNOSIS — R200 Anesthesia of skin: Secondary | ICD-10-CM | POA: Diagnosis not present

## 2019-06-08 DIAGNOSIS — I639 Cerebral infarction, unspecified: Secondary | ICD-10-CM | POA: Diagnosis not present

## 2019-06-08 DIAGNOSIS — Z6834 Body mass index (BMI) 34.0-34.9, adult: Secondary | ICD-10-CM | POA: Diagnosis not present

## 2019-06-08 DIAGNOSIS — I10 Essential (primary) hypertension: Secondary | ICD-10-CM | POA: Diagnosis not present

## 2019-06-08 DIAGNOSIS — F17210 Nicotine dependence, cigarettes, uncomplicated: Secondary | ICD-10-CM | POA: Diagnosis not present

## 2019-06-08 DIAGNOSIS — E7800 Pure hypercholesterolemia, unspecified: Secondary | ICD-10-CM | POA: Diagnosis not present

## 2019-06-08 DIAGNOSIS — Z299 Encounter for prophylactic measures, unspecified: Secondary | ICD-10-CM | POA: Diagnosis not present

## 2019-06-13 DIAGNOSIS — M5412 Radiculopathy, cervical region: Secondary | ICD-10-CM | POA: Diagnosis not present

## 2019-06-13 DIAGNOSIS — M4802 Spinal stenosis, cervical region: Secondary | ICD-10-CM | POA: Diagnosis not present

## 2019-06-13 DIAGNOSIS — M542 Cervicalgia: Secondary | ICD-10-CM | POA: Diagnosis not present

## 2019-06-13 DIAGNOSIS — M4722 Other spondylosis with radiculopathy, cervical region: Secondary | ICD-10-CM | POA: Diagnosis not present

## 2019-06-13 DIAGNOSIS — I10 Essential (primary) hypertension: Secondary | ICD-10-CM | POA: Diagnosis not present

## 2019-07-05 DIAGNOSIS — Z20822 Contact with and (suspected) exposure to covid-19: Secondary | ICD-10-CM | POA: Diagnosis not present

## 2019-07-10 DIAGNOSIS — M50122 Cervical disc disorder at C5-C6 level with radiculopathy: Secondary | ICD-10-CM | POA: Diagnosis not present

## 2019-07-10 DIAGNOSIS — M47812 Spondylosis without myelopathy or radiculopathy, cervical region: Secondary | ICD-10-CM | POA: Diagnosis not present

## 2019-07-10 DIAGNOSIS — M4802 Spinal stenosis, cervical region: Secondary | ICD-10-CM | POA: Diagnosis not present

## 2019-07-10 DIAGNOSIS — M4722 Other spondylosis with radiculopathy, cervical region: Secondary | ICD-10-CM | POA: Diagnosis not present

## 2019-07-17 DIAGNOSIS — I10 Essential (primary) hypertension: Secondary | ICD-10-CM | POA: Diagnosis not present

## 2019-07-17 DIAGNOSIS — Z299 Encounter for prophylactic measures, unspecified: Secondary | ICD-10-CM | POA: Diagnosis not present

## 2019-07-17 DIAGNOSIS — E1129 Type 2 diabetes mellitus with other diabetic kidney complication: Secondary | ICD-10-CM | POA: Diagnosis not present

## 2019-07-17 DIAGNOSIS — E1165 Type 2 diabetes mellitus with hyperglycemia: Secondary | ICD-10-CM | POA: Diagnosis not present

## 2019-07-17 DIAGNOSIS — I639 Cerebral infarction, unspecified: Secondary | ICD-10-CM | POA: Diagnosis not present

## 2019-07-24 DIAGNOSIS — Z299 Encounter for prophylactic measures, unspecified: Secondary | ICD-10-CM | POA: Diagnosis not present

## 2019-07-24 DIAGNOSIS — Z8673 Personal history of transient ischemic attack (TIA), and cerebral infarction without residual deficits: Secondary | ICD-10-CM | POA: Diagnosis not present

## 2019-07-24 DIAGNOSIS — F339 Major depressive disorder, recurrent, unspecified: Secondary | ICD-10-CM | POA: Diagnosis not present

## 2019-07-24 DIAGNOSIS — E1165 Type 2 diabetes mellitus with hyperglycemia: Secondary | ICD-10-CM | POA: Diagnosis not present

## 2019-07-24 DIAGNOSIS — E1122 Type 2 diabetes mellitus with diabetic chronic kidney disease: Secondary | ICD-10-CM | POA: Diagnosis not present

## 2019-07-24 DIAGNOSIS — I10 Essential (primary) hypertension: Secondary | ICD-10-CM | POA: Diagnosis not present

## 2019-07-31 DIAGNOSIS — I10 Essential (primary) hypertension: Secondary | ICD-10-CM | POA: Diagnosis not present

## 2019-07-31 DIAGNOSIS — M4722 Other spondylosis with radiculopathy, cervical region: Secondary | ICD-10-CM | POA: Diagnosis not present

## 2019-08-14 DIAGNOSIS — Z79891 Long term (current) use of opiate analgesic: Secondary | ICD-10-CM | POA: Diagnosis not present

## 2019-08-14 DIAGNOSIS — M5136 Other intervertebral disc degeneration, lumbar region: Secondary | ICD-10-CM | POA: Diagnosis not present

## 2019-08-14 DIAGNOSIS — M47812 Spondylosis without myelopathy or radiculopathy, cervical region: Secondary | ICD-10-CM | POA: Diagnosis not present

## 2019-08-14 DIAGNOSIS — M5416 Radiculopathy, lumbar region: Secondary | ICD-10-CM | POA: Diagnosis not present

## 2019-08-14 DIAGNOSIS — M4807 Spinal stenosis, lumbosacral region: Secondary | ICD-10-CM | POA: Diagnosis not present

## 2019-08-16 DIAGNOSIS — E1165 Type 2 diabetes mellitus with hyperglycemia: Secondary | ICD-10-CM | POA: Diagnosis not present

## 2019-08-16 DIAGNOSIS — E1122 Type 2 diabetes mellitus with diabetic chronic kidney disease: Secondary | ICD-10-CM | POA: Diagnosis not present

## 2019-08-16 DIAGNOSIS — Z8673 Personal history of transient ischemic attack (TIA), and cerebral infarction without residual deficits: Secondary | ICD-10-CM | POA: Diagnosis not present

## 2019-08-16 DIAGNOSIS — I10 Essential (primary) hypertension: Secondary | ICD-10-CM | POA: Diagnosis not present

## 2019-08-16 DIAGNOSIS — Z299 Encounter for prophylactic measures, unspecified: Secondary | ICD-10-CM | POA: Diagnosis not present

## 2019-08-16 DIAGNOSIS — M542 Cervicalgia: Secondary | ICD-10-CM | POA: Diagnosis not present

## 2019-08-29 DIAGNOSIS — E1165 Type 2 diabetes mellitus with hyperglycemia: Secondary | ICD-10-CM | POA: Diagnosis not present

## 2019-08-29 DIAGNOSIS — I10 Essential (primary) hypertension: Secondary | ICD-10-CM | POA: Diagnosis not present

## 2019-08-29 DIAGNOSIS — I2510 Atherosclerotic heart disease of native coronary artery without angina pectoris: Secondary | ICD-10-CM | POA: Diagnosis not present

## 2019-08-29 DIAGNOSIS — Z299 Encounter for prophylactic measures, unspecified: Secondary | ICD-10-CM | POA: Diagnosis not present

## 2019-08-29 DIAGNOSIS — I639 Cerebral infarction, unspecified: Secondary | ICD-10-CM | POA: Diagnosis not present

## 2019-09-25 DIAGNOSIS — M542 Cervicalgia: Secondary | ICD-10-CM | POA: Diagnosis not present

## 2019-09-25 DIAGNOSIS — M79602 Pain in left arm: Secondary | ICD-10-CM | POA: Diagnosis not present

## 2019-09-26 DIAGNOSIS — I10 Essential (primary) hypertension: Secondary | ICD-10-CM | POA: Diagnosis not present

## 2019-09-26 DIAGNOSIS — Z299 Encounter for prophylactic measures, unspecified: Secondary | ICD-10-CM | POA: Diagnosis not present

## 2019-09-26 DIAGNOSIS — M542 Cervicalgia: Secondary | ICD-10-CM | POA: Diagnosis not present

## 2019-09-26 DIAGNOSIS — I639 Cerebral infarction, unspecified: Secondary | ICD-10-CM | POA: Diagnosis not present

## 2019-10-08 DIAGNOSIS — E119 Type 2 diabetes mellitus without complications: Secondary | ICD-10-CM | POA: Diagnosis not present

## 2019-10-08 DIAGNOSIS — I10 Essential (primary) hypertension: Secondary | ICD-10-CM | POA: Diagnosis not present

## 2019-11-08 DIAGNOSIS — E119 Type 2 diabetes mellitus without complications: Secondary | ICD-10-CM | POA: Diagnosis not present

## 2019-11-08 DIAGNOSIS — I10 Essential (primary) hypertension: Secondary | ICD-10-CM | POA: Diagnosis not present

## 2019-12-04 DIAGNOSIS — Z299 Encounter for prophylactic measures, unspecified: Secondary | ICD-10-CM | POA: Diagnosis not present

## 2019-12-04 DIAGNOSIS — Z0000 Encounter for general adult medical examination without abnormal findings: Secondary | ICD-10-CM | POA: Diagnosis not present

## 2019-12-04 DIAGNOSIS — Z7189 Other specified counseling: Secondary | ICD-10-CM | POA: Diagnosis not present

## 2019-12-04 DIAGNOSIS — E1165 Type 2 diabetes mellitus with hyperglycemia: Secondary | ICD-10-CM | POA: Diagnosis not present

## 2019-12-04 DIAGNOSIS — Z1339 Encounter for screening examination for other mental health and behavioral disorders: Secondary | ICD-10-CM | POA: Diagnosis not present

## 2019-12-04 DIAGNOSIS — Z6825 Body mass index (BMI) 25.0-25.9, adult: Secondary | ICD-10-CM | POA: Diagnosis not present

## 2019-12-04 DIAGNOSIS — I69952 Hemiplegia and hemiparesis following unspecified cerebrovascular disease affecting left dominant side: Secondary | ICD-10-CM | POA: Diagnosis not present

## 2019-12-04 DIAGNOSIS — Z1331 Encounter for screening for depression: Secondary | ICD-10-CM | POA: Diagnosis not present

## 2019-12-05 DIAGNOSIS — E559 Vitamin D deficiency, unspecified: Secondary | ICD-10-CM | POA: Diagnosis not present

## 2019-12-05 DIAGNOSIS — E7800 Pure hypercholesterolemia, unspecified: Secondary | ICD-10-CM | POA: Diagnosis not present

## 2019-12-05 DIAGNOSIS — R5383 Other fatigue: Secondary | ICD-10-CM | POA: Diagnosis not present

## 2019-12-05 DIAGNOSIS — Z79899 Other long term (current) drug therapy: Secondary | ICD-10-CM | POA: Diagnosis not present

## 2019-12-08 DIAGNOSIS — E119 Type 2 diabetes mellitus without complications: Secondary | ICD-10-CM | POA: Diagnosis not present

## 2019-12-08 DIAGNOSIS — I10 Essential (primary) hypertension: Secondary | ICD-10-CM | POA: Diagnosis not present

## 2019-12-14 DIAGNOSIS — G5622 Lesion of ulnar nerve, left upper limb: Secondary | ICD-10-CM | POA: Diagnosis not present

## 2019-12-18 DIAGNOSIS — I10 Essential (primary) hypertension: Secondary | ICD-10-CM | POA: Diagnosis not present

## 2019-12-18 DIAGNOSIS — E119 Type 2 diabetes mellitus without complications: Secondary | ICD-10-CM | POA: Diagnosis not present

## 2019-12-19 DIAGNOSIS — M4722 Other spondylosis with radiculopathy, cervical region: Secondary | ICD-10-CM | POA: Diagnosis not present

## 2019-12-19 DIAGNOSIS — M5412 Radiculopathy, cervical region: Secondary | ICD-10-CM | POA: Diagnosis not present

## 2019-12-19 DIAGNOSIS — G5622 Lesion of ulnar nerve, left upper limb: Secondary | ICD-10-CM | POA: Diagnosis not present

## 2020-01-04 DIAGNOSIS — M4802 Spinal stenosis, cervical region: Secondary | ICD-10-CM | POA: Diagnosis not present

## 2020-01-04 DIAGNOSIS — M5013 Cervical disc disorder with radiculopathy, cervicothoracic region: Secondary | ICD-10-CM | POA: Diagnosis not present

## 2020-01-09 DIAGNOSIS — I69952 Hemiplegia and hemiparesis following unspecified cerebrovascular disease affecting left dominant side: Secondary | ICD-10-CM | POA: Diagnosis not present

## 2020-01-09 DIAGNOSIS — Z299 Encounter for prophylactic measures, unspecified: Secondary | ICD-10-CM | POA: Diagnosis not present

## 2020-01-09 DIAGNOSIS — M542 Cervicalgia: Secondary | ICD-10-CM | POA: Diagnosis not present

## 2020-01-09 DIAGNOSIS — Z6825 Body mass index (BMI) 25.0-25.9, adult: Secondary | ICD-10-CM | POA: Diagnosis not present

## 2020-01-09 DIAGNOSIS — I10 Essential (primary) hypertension: Secondary | ICD-10-CM | POA: Diagnosis not present

## 2020-01-30 DIAGNOSIS — E1165 Type 2 diabetes mellitus with hyperglycemia: Secondary | ICD-10-CM | POA: Diagnosis not present

## 2020-01-30 DIAGNOSIS — F419 Anxiety disorder, unspecified: Secondary | ICD-10-CM | POA: Diagnosis not present

## 2020-01-30 DIAGNOSIS — M4802 Spinal stenosis, cervical region: Secondary | ICD-10-CM | POA: Diagnosis not present

## 2020-01-30 DIAGNOSIS — Z299 Encounter for prophylactic measures, unspecified: Secondary | ICD-10-CM | POA: Diagnosis not present

## 2020-01-30 DIAGNOSIS — F40240 Claustrophobia: Secondary | ICD-10-CM | POA: Diagnosis not present

## 2020-01-30 DIAGNOSIS — M19012 Primary osteoarthritis, left shoulder: Secondary | ICD-10-CM | POA: Diagnosis not present

## 2020-01-30 DIAGNOSIS — M5412 Radiculopathy, cervical region: Secondary | ICD-10-CM | POA: Diagnosis not present

## 2020-01-30 DIAGNOSIS — I10 Essential (primary) hypertension: Secondary | ICD-10-CM | POA: Diagnosis not present

## 2020-02-08 DIAGNOSIS — E119 Type 2 diabetes mellitus without complications: Secondary | ICD-10-CM | POA: Diagnosis not present

## 2020-02-08 DIAGNOSIS — I10 Essential (primary) hypertension: Secondary | ICD-10-CM | POA: Diagnosis not present

## 2020-02-09 DIAGNOSIS — G8194 Hemiplegia, unspecified affecting left nondominant side: Secondary | ICD-10-CM | POA: Diagnosis not present

## 2020-02-09 DIAGNOSIS — Z299 Encounter for prophylactic measures, unspecified: Secondary | ICD-10-CM | POA: Diagnosis not present

## 2020-02-09 DIAGNOSIS — I639 Cerebral infarction, unspecified: Secondary | ICD-10-CM | POA: Diagnosis not present

## 2020-02-09 DIAGNOSIS — I10 Essential (primary) hypertension: Secondary | ICD-10-CM | POA: Diagnosis not present

## 2020-02-09 DIAGNOSIS — Z6826 Body mass index (BMI) 26.0-26.9, adult: Secondary | ICD-10-CM | POA: Diagnosis not present

## 2020-02-09 DIAGNOSIS — F339 Major depressive disorder, recurrent, unspecified: Secondary | ICD-10-CM | POA: Diagnosis not present

## 2020-02-13 DIAGNOSIS — M7592 Shoulder lesion, unspecified, left shoulder: Secondary | ICD-10-CM | POA: Diagnosis not present

## 2020-02-13 DIAGNOSIS — M19012 Primary osteoarthritis, left shoulder: Secondary | ICD-10-CM | POA: Diagnosis not present

## 2020-02-13 DIAGNOSIS — M79602 Pain in left arm: Secondary | ICD-10-CM | POA: Diagnosis not present

## 2020-02-13 DIAGNOSIS — M5124 Other intervertebral disc displacement, thoracic region: Secondary | ICD-10-CM | POA: Diagnosis not present

## 2020-02-28 DIAGNOSIS — G8929 Other chronic pain: Secondary | ICD-10-CM | POA: Diagnosis not present

## 2020-02-28 DIAGNOSIS — M4807 Spinal stenosis, lumbosacral region: Secondary | ICD-10-CM | POA: Diagnosis not present

## 2020-02-28 DIAGNOSIS — M25512 Pain in left shoulder: Secondary | ICD-10-CM | POA: Diagnosis not present

## 2020-02-28 DIAGNOSIS — M5416 Radiculopathy, lumbar region: Secondary | ICD-10-CM | POA: Diagnosis not present

## 2020-02-28 DIAGNOSIS — M5136 Other intervertebral disc degeneration, lumbar region: Secondary | ICD-10-CM | POA: Diagnosis not present

## 2020-02-28 DIAGNOSIS — M47812 Spondylosis without myelopathy or radiculopathy, cervical region: Secondary | ICD-10-CM | POA: Diagnosis not present

## 2020-02-28 DIAGNOSIS — Z79891 Long term (current) use of opiate analgesic: Secondary | ICD-10-CM | POA: Diagnosis not present

## 2020-03-04 ENCOUNTER — Emergency Department (HOSPITAL_COMMUNITY)
Admission: EM | Admit: 2020-03-04 | Discharge: 2020-03-04 | Disposition: A | Discharge status: Home / Self Care | Payer: Medicare Other | Attending: Emergency Medicine | Admitting: Emergency Medicine

## 2020-03-04 ENCOUNTER — Other Ambulatory Visit: Payer: Self-pay

## 2020-03-04 ENCOUNTER — Emergency Department (HOSPITAL_COMMUNITY): Payer: Medicare Other

## 2020-03-04 ENCOUNTER — Encounter (HOSPITAL_COMMUNITY): Payer: Self-pay

## 2020-03-04 DIAGNOSIS — Z7982 Long term (current) use of aspirin: Secondary | ICD-10-CM | POA: Diagnosis not present

## 2020-03-04 DIAGNOSIS — Z7984 Long term (current) use of oral hypoglycemic drugs: Secondary | ICD-10-CM | POA: Diagnosis not present

## 2020-03-04 DIAGNOSIS — Z7901 Long term (current) use of anticoagulants: Secondary | ICD-10-CM | POA: Diagnosis not present

## 2020-03-04 DIAGNOSIS — E119 Type 2 diabetes mellitus without complications: Secondary | ICD-10-CM | POA: Insufficient documentation

## 2020-03-04 DIAGNOSIS — W01198A Fall on same level from slipping, tripping and stumbling with subsequent striking against other object, initial encounter: Secondary | ICD-10-CM | POA: Diagnosis not present

## 2020-03-04 DIAGNOSIS — I10 Essential (primary) hypertension: Secondary | ICD-10-CM | POA: Diagnosis not present

## 2020-03-04 DIAGNOSIS — Y93G1 Activity, food preparation and clean up: Secondary | ICD-10-CM | POA: Diagnosis not present

## 2020-03-04 DIAGNOSIS — Z955 Presence of coronary angioplasty implant and graft: Secondary | ICD-10-CM | POA: Diagnosis not present

## 2020-03-04 DIAGNOSIS — I2510 Atherosclerotic heart disease of native coronary artery without angina pectoris: Secondary | ICD-10-CM | POA: Diagnosis not present

## 2020-03-04 DIAGNOSIS — M1612 Unilateral primary osteoarthritis, left hip: Secondary | ICD-10-CM | POA: Diagnosis not present

## 2020-03-04 DIAGNOSIS — S7002XA Contusion of left hip, initial encounter: Secondary | ICD-10-CM

## 2020-03-04 DIAGNOSIS — F17210 Nicotine dependence, cigarettes, uncomplicated: Secondary | ICD-10-CM | POA: Diagnosis not present

## 2020-03-04 DIAGNOSIS — Z79899 Other long term (current) drug therapy: Secondary | ICD-10-CM | POA: Insufficient documentation

## 2020-03-04 DIAGNOSIS — Y92000 Kitchen of unspecified non-institutional (private) residence as  the place of occurrence of the external cause: Secondary | ICD-10-CM | POA: Diagnosis not present

## 2020-03-04 HISTORY — DX: Cerebral infarction, unspecified: I63.9

## 2020-03-04 LAB — BASIC METABOLIC PANEL
Anion gap: 10 (ref 5–15)
BUN: 23 mg/dL (ref 8–23)
CO2: 22 mmol/L (ref 22–32)
Calcium: 8.8 mg/dL — ABNORMAL LOW (ref 8.9–10.3)
Chloride: 100 mmol/L (ref 98–111)
Creatinine, Ser: 1.06 mg/dL — ABNORMAL HIGH (ref 0.44–1.00)
GFR, Estimated: 56 mL/min — ABNORMAL LOW (ref >60)
Glucose, Bld: 220 mg/dL — ABNORMAL HIGH (ref 70–99)
Potassium: 4.7 mmol/L (ref 3.5–5.1)
Sodium: 132 mmol/L — ABNORMAL LOW (ref 135–145)

## 2020-03-04 LAB — TYPE AND SCREEN
ABO/RH(D): O POS
Antibody Screen: NEGATIVE

## 2020-03-04 LAB — CBC
HCT: 35.5 % — ABNORMAL LOW (ref 36.0–46.0)
Hemoglobin: 11.6 g/dL — ABNORMAL LOW (ref 12.0–15.0)
MCH: 31.6 pg (ref 26.0–34.0)
MCHC: 32.7 g/dL (ref 30.0–36.0)
MCV: 96.7 fL (ref 80.0–100.0)
Platelets: 256 10*3/uL (ref 150–400)
RBC: 3.67 MIL/uL — ABNORMAL LOW (ref 3.87–5.11)
RDW: 15 % (ref 11.5–15.5)
WBC: 14.1 10*3/uL — ABNORMAL HIGH (ref 4.0–10.5)
nRBC: 0 % (ref 0.0–0.2)

## 2020-03-04 LAB — PROTIME-INR
INR: 2.9 — ABNORMAL HIGH (ref 0.8–1.2)
Prothrombin Time: 29.3 seconds — ABNORMAL HIGH (ref 11.4–15.2)

## 2020-03-04 NOTE — ED Notes (Signed)
Patient transported to X-ray at this time 

## 2020-03-04 NOTE — Discharge Instructions (Signed)
Your testing today shows that you have no signs of broken bones, it does appear that you have a large bruise with bleeding that is occurring because of the Coumadin that you take for your blood thinner.  Because of this you will likely have bruising bleeding and swelling for the better part of the next couple of weeks and it may take a month or 2 to completely go away.  I would like for you to see your doctor within 1 week but please return to the emergency department for severe worsening symptoms.  In the meantime  Apply ice intermittently wrapped in a towel  Tylenol for pain up to 500 mg every 6 hours  Gentle exercise including walking and stairs.

## 2020-03-04 NOTE — ED Provider Notes (Signed)
[facility] EMERGENCY DEPARTMENT Provider Note   CSN: [id number] Arrival date & time: 03/04/20  7902     History Chief Complaint  Patient presents with   [name]    Rachel Perkins is a 69 y.o. female.  HPI   This patient is a 69 year old female, she has a history of a prior stroke for which she has been placed on warfarin.  She presents to the hospital stating that yesterday while she was preparing dinner she lost her balance in the kitchen and fell to the ground striking her left buttock and hip.  She was able to get help to her feet and walk and has been walking today though with significant discomfort.  She states that her husband saw her skin this morning and it was significantly bruised, she decided to come to the evaluation of the hospital because of the pain and swelling and bruising.  She denies loss of consciousness, denies hitting her head and has had no chest pain palpitations or shortness of breath.  She has not missed any doses of her Coumadin, she has not had her level checked in a couple of weeks.  No other medications for the pain given prior to arrival  Past Medical History:  Diagnosis Date   Anginal pain ([place])    Arthritis    CAD (coronary artery disease)    a. 09/25/13 s/p DES to [facility] and DES to pRCA.   Chronic left-sided back pain    has had ESI/Dr. Francesco Runner   DM (diabetes mellitus) ([place])    a. diet controlled.    HTN (hypertension)    Myocardial infarction ([place]) 09/2013   nstemi   Stroke [facility])    Tobacco abuse     Patient Active Problem List   Diagnosis Date Noted   Subtherapeutic international normalized ratio (INR)    Acute ischemic right MCA stroke ([place]) 05/18/2018   Chronic pain syndrome    Diabetes mellitus type 2 in nonobese [facility])    Sleep disturbance    NSTEMI (non-ST elevated myocardial infarction) ([place]) 09/25/2013   Coronary atherosclerosis of native coronary artery 09/25/2013   HTN (hypertension) 09/25/2013   Diabetes  mellitus ([place]) 09/25/2013   Tobacco abuse 09/25/2013    Past Surgical History:  Procedure Laterality Date   ABDOMINAL HYSTERECTOMY     ANKLE SURGERY     APPENDECTOMY     CORONARY ANGIOPLASTY  09/25/2013   des mid circumflex  des rca   LEFT HEART CATHETERIZATION WITH CORONARY ANGIOGRAM N/A 09/25/2013   Procedure: LEFT HEART CATHETERIZATION WITH CORONARY ANGIOGRAM;  Surgeon: Burnell Blanks, MD;  Location: [facility] CATH LAB;  Service: Cardiovascular;  Laterality: N/A;   skin grafts left arm       OB History   No obstetric history on file.     Family History  Problem Relation Age of Onset   Heart failure Father 44   Cirrhosis Mother    Alcohol abuse Mother    Diabetes Sister    Breast cancer Sister        twin sister   Uterine cancer Sister     Social History   Tobacco Use   Smoking status: Current Every Day Smoker    Packs/day: 0.50    Years: 50.00    Pack years: 25.00    Types: Cigarettes    Start date: 06/04/1964    Last attempt to quit: 11/16/2015    Years since quitting: 4.3   Smokeless tobacco: Never Used  Vaping Use  Vaping Use: Never used  Substance Use Topics   Alcohol use: No    Alcohol/week: 0.0 standard drinks   Drug use: No    Home Medications Prior to Admission medications   Medication Sig Start Date End Date Taking? Authorizing Provider  acetaminophen (TYLENOL) 325 MG tablet Take 1-2 tablets (325-650 mg total) by mouth every 4 (four) hours as needed for mild pain. 05/19/18   Love, Ivan Anchors, PA-C  aspirin EC 81 MG tablet Take 81 mg by mouth daily.    [provider]  atorvastatin (LIPITOR) 80 MG tablet Take 80 mg by mouth daily.    [provider]  augmented betamethasone dipropionate (DIPROLENE-AF) 0.05 % cream Apply 1 application topically 2 (two) times daily as needed (skin irritation).  07/02/03   [provider]  calcium-vitamin D (OSCAL WITH D) 500-200 MG-UNIT tablet Take 1 tablet by mouth daily with  breakfast. 05/20/18   Love, Ivan Anchors, PA-C  diclofenac sodium (VOLTAREN) 1 % GEL Apply 2 g topically 3 (three) times daily with meals. 05/27/18   Love, Ivan Anchors, PA-C  Melatonin 1 MG TABS Take 2 mg by mouth at bedtime.    [provider]  Menthol-Methyl Salicylate (MUSCLE RUB) 10-15 % CREA Apply 1 application topically 3 (three) times daily as needed for muscle pain. 05/27/18   Love, Ivan Anchors, PA-C  metFORMIN (GLUCOPHAGE-XR) 500 MG 24 hr tablet Take 1 tablet (500 mg total) by mouth daily. 05/27/18   Love, Ivan Anchors, PA-C  metoprolol tartrate (LOPRESSOR) 25 MG tablet Take 1 tablet (25 mg total) by mouth 2 (two) times daily. 05/27/18   Love, Ivan Anchors, PA-C  nicotine (NICODERM CQ - DOSED IN MG/24 HOURS) 21 mg/24hr patch Place 1 patch (21 mg total) onto the skin daily. 05/27/18   Love, Ivan Anchors, PA-C  nitroGLYCERIN (NITROSTAT) 0.4 MG SL tablet PLACE 1 TABLET UNDER THE TONGUE EVERY 5 MINUTES FOR 3 DOSES AS NEEDED CHEST PAIN Patient not taking: Reported on 05/18/2018 12/13/17   Herminio Commons, MD  QUEtiapine (SEROQUEL) 25 MG tablet Take 1 tablet (25 mg total) by mouth 2 (two) times daily. 05/27/18   Love, Ivan Anchors, PA-C  warfarin (COUMADIN) 5 MG tablet Take 1 tablet (5 mg total) by mouth daily at 6 PM. Patient taking differently: Take 5 mg by mouth daily at 6 PM. Take 2 on Monday then one daily 05/27/18   Love, Ivan Anchors, PA-C    Allergies    Codeine, Penicillins, Sulfa antibiotics, Lisinopril, and Other  Review of Systems   Review of Systems  All other systems reviewed and are negative.   Physical Exam Updated Vital Signs BP 96/65    Pulse 70    Temp 97.7 F (36.5 C) (Oral)    Resp 20    Ht 1.6 m (5\' 3" )    Wt 62.1 kg    SpO2 100%    BMI 24.27 kg/m   Physical Exam Vitals and nursing note reviewed.  Constitutional:      General: She is not in acute distress.    Appearance: She is well-developed.  HENT:     Head: Normocephalic and atraumatic.     Mouth/Throat:     Pharynx: No  oropharyngeal exudate.  Eyes:     General: No scleral icterus.       Right eye: No discharge.        Left eye: No discharge.     Conjunctiva/sclera: Conjunctivae normal.     Pupils: Pupils are  equal, round, and reactive to light.  Neck:     Thyroid: No thyromegaly.     Vascular: No JVD.  Cardiovascular:     Rate and Rhythm: Normal rate and regular rhythm.     Heart sounds: Normal heart sounds. No murmur heard.  No friction rub. No gallop.   Pulmonary:     Effort: Pulmonary effort is normal. No respiratory distress.     Breath sounds: Normal breath sounds. No wheezing or rales.  Abdominal:     General: Bowel sounds are normal. There is no distension.     Palpations: Abdomen is soft. There is no mass.     Tenderness: There is no abdominal tenderness.  Musculoskeletal:        General: No tenderness. Normal range of motion.     Cervical back: Normal range of motion and neck supple.     Comments: Chaperone present for exam, the patient's pants were taken off and it appears that she has a large hematoma extending from her iliac crest and into the buttock down towards the mid thigh.  This is all lateral and posterior.  The patient is able to straight leg raise bilaterally, this does cause some pain on the left.  There is minimal pain with internal and external rotation of the hip on the left.  There is no other joint deformities of the legs or arms and there is no leg length discrepancy.  Lymphadenopathy:     Cervical: No cervical adenopathy.  Skin:    General: Skin is warm and dry.     Findings: No erythema or rash.  Neurological:     Mental Status: She is alert.     Coordination: Coordination normal.     Comments: The patient is awake alert and able to follow commands without difficulty  Psychiatric:        Behavior: Behavior normal.     ED Results / Procedures / Treatments   Labs (all labs ordered are listed, but only abnormal results are displayed) Labs Reviewed  PROTIME-INR  - Abnormal; Notable for the following components:      Result Value   Prothrombin Time 29.3 (*)    INR 2.9 (*)    All other components within normal limits  CBC - Abnormal; Notable for the following components:   WBC 14.1 (*)    RBC 3.67 (*)    Hemoglobin 11.6 (*)    HCT 35.5 (*)    All other components within normal limits  BASIC METABOLIC PANEL - Abnormal; Notable for the following components:   Sodium 132 (*)    Glucose, Bld 220 (*)    Creatinine, Ser 1.06 (*)    Calcium 8.8 (*)    GFR, Estimated 56 (*)    All other components within normal limits  TYPE AND SCREEN    EKG EKG Interpretation  Date/Time:  Monday March 04 2020 08:42:49 EDT Ventricular Rate:  71 PR Interval:    QRS Duration: 87 QT Interval:  410 QTC Calculation: 446 R Axis:   45 Text Interpretation: Sinus rhythm Low voltage, extremity leads Baseline wander in lead(s) III ECG OTHERWISE WITHIN NORMAL LIMITS Confirmed by Noemi Chapel [phone]) on 03/04/2020 9:09:52 AM   Radiology DG Hip Unilat W or Wo Pelvis 2-3 Views Left  Result Date: 03/04/2020 CLINICAL DATA:  Fall with bruising and lateral-sided pain EXAM: DG HIP (WITH OR WITHOUT PELVIS) 2-3V LEFT COMPARISON:  None available FINDINGS: Soft tissue stranding lateral to the left hip. No acute  fracture or pelvic ring diastasis. Both hips are located. Degenerative facet spurring on both sides with axial joint space narrowing. Extensive iliac atherosclerotic calcification. IMPRESSION: 1. Lateral soft tissue swelling without acute fracture. 2. Bilateral hip osteoarthritis. Electronically Signed   By: Monte Fantasia M.D.   On: 03/04/2020 09:38    Procedures Procedures (including critical care time)  Medications Ordered in ED Medications - No data to display  ED Course  I have reviewed the triage vital signs and the nursing notes.  Pertinent labs & imaging results that were available during my care of the patient were reviewed by me and considered in my  medical decision making (see chart for details).    MDM Rules/Calculators/A&P                          The location of the bruising and pain suggest that there could be hematoma from the fall and the anticoagulation but also would suspect possible fracture of either the pelvis or the hip.  Imaging ordered, INR ordered, the patient is agreeable  The patient was given appropriate expectations given the lack of fracture but the presence of her coagulopathy relative to taking Coumadin.  Ice, rest, Tylenol, agreeable  Final Clinical Impression(s) / ED Diagnoses Final diagnoses:  Hematoma of left hip, initial encounter    Rx / DC Orders ED Discharge Orders    None       Noemi Chapel, MD 03/04/20 1108

## 2020-03-04 NOTE — ED Triage Notes (Signed)
Pt presents to ED after fall yesterday. Pt states she lost balance and fell and landed on her left side. Pt c/o left hip and leg pain. Pt denies LOC

## 2020-03-05 DIAGNOSIS — Z6826 Body mass index (BMI) 26.0-26.9, adult: Secondary | ICD-10-CM | POA: Diagnosis not present

## 2020-03-05 DIAGNOSIS — I10 Essential (primary) hypertension: Secondary | ICD-10-CM | POA: Diagnosis not present

## 2020-03-05 DIAGNOSIS — Z299 Encounter for prophylactic measures, unspecified: Secondary | ICD-10-CM | POA: Diagnosis not present

## 2020-03-05 DIAGNOSIS — Z23 Encounter for immunization: Secondary | ICD-10-CM | POA: Diagnosis not present

## 2020-03-05 DIAGNOSIS — I639 Cerebral infarction, unspecified: Secondary | ICD-10-CM | POA: Diagnosis not present

## 2020-03-05 DIAGNOSIS — F339 Major depressive disorder, recurrent, unspecified: Secondary | ICD-10-CM | POA: Diagnosis not present

## 2020-03-08 DIAGNOSIS — I10 Essential (primary) hypertension: Secondary | ICD-10-CM | POA: Diagnosis not present

## 2020-03-08 DIAGNOSIS — E119 Type 2 diabetes mellitus without complications: Secondary | ICD-10-CM | POA: Diagnosis not present

## 2020-03-13 DIAGNOSIS — E1165 Type 2 diabetes mellitus with hyperglycemia: Secondary | ICD-10-CM | POA: Diagnosis not present

## 2020-03-13 DIAGNOSIS — Z8673 Personal history of transient ischemic attack (TIA), and cerebral infarction without residual deficits: Secondary | ICD-10-CM | POA: Diagnosis not present

## 2020-03-13 DIAGNOSIS — Z299 Encounter for prophylactic measures, unspecified: Secondary | ICD-10-CM | POA: Diagnosis not present

## 2020-03-13 DIAGNOSIS — F17210 Nicotine dependence, cigarettes, uncomplicated: Secondary | ICD-10-CM | POA: Diagnosis not present

## 2020-03-13 DIAGNOSIS — I10 Essential (primary) hypertension: Secondary | ICD-10-CM | POA: Diagnosis not present

## 2020-04-09 DIAGNOSIS — E119 Type 2 diabetes mellitus without complications: Secondary | ICD-10-CM | POA: Diagnosis not present

## 2020-04-09 DIAGNOSIS — I10 Essential (primary) hypertension: Secondary | ICD-10-CM | POA: Diagnosis not present

## 2020-04-15 ENCOUNTER — Other Ambulatory Visit: Payer: Self-pay

## 2020-04-15 ENCOUNTER — Ambulatory Visit
Admission: RE | Admit: 2020-04-15 | Discharge: 2020-04-15 | Disposition: A | Discharge status: Home / Self Care | Payer: Medicare Other | Source: Ambulatory Visit | Attending: Internal Medicine | Admitting: Internal Medicine

## 2020-04-15 ENCOUNTER — Other Ambulatory Visit: Payer: Self-pay | Admitting: Internal Medicine

## 2020-04-15 DIAGNOSIS — Z6827 Body mass index (BMI) 27.0-27.9, adult: Secondary | ICD-10-CM | POA: Diagnosis not present

## 2020-04-15 DIAGNOSIS — Z1231 Encounter for screening mammogram for malignant neoplasm of breast: Secondary | ICD-10-CM

## 2020-04-15 DIAGNOSIS — Z713 Dietary counseling and surveillance: Secondary | ICD-10-CM | POA: Diagnosis not present

## 2020-04-15 DIAGNOSIS — Z299 Encounter for prophylactic measures, unspecified: Secondary | ICD-10-CM | POA: Diagnosis not present

## 2020-04-15 DIAGNOSIS — I10 Essential (primary) hypertension: Secondary | ICD-10-CM | POA: Diagnosis not present

## 2020-04-15 DIAGNOSIS — Z8673 Personal history of transient ischemic attack (TIA), and cerebral infarction without residual deficits: Secondary | ICD-10-CM | POA: Diagnosis not present

## 2020-04-15 DIAGNOSIS — I2510 Atherosclerotic heart disease of native coronary artery without angina pectoris: Secondary | ICD-10-CM | POA: Diagnosis not present

## 2020-04-23 DIAGNOSIS — I10 Essential (primary) hypertension: Secondary | ICD-10-CM | POA: Diagnosis not present

## 2020-04-23 DIAGNOSIS — E119 Type 2 diabetes mellitus without complications: Secondary | ICD-10-CM | POA: Diagnosis not present

## 2020-04-23 DIAGNOSIS — F339 Major depressive disorder, recurrent, unspecified: Secondary | ICD-10-CM | POA: Diagnosis not present

## 2020-04-23 DIAGNOSIS — M4722 Other spondylosis with radiculopathy, cervical region: Secondary | ICD-10-CM | POA: Diagnosis not present

## 2020-04-23 DIAGNOSIS — M5030 Other cervical disc degeneration, unspecified cervical region: Secondary | ICD-10-CM | POA: Diagnosis not present

## 2020-04-23 DIAGNOSIS — N183 Chronic kidney disease, stage 3 unspecified: Secondary | ICD-10-CM | POA: Diagnosis not present

## 2020-04-23 DIAGNOSIS — Z8673 Personal history of transient ischemic attack (TIA), and cerebral infarction without residual deficits: Secondary | ICD-10-CM | POA: Diagnosis not present

## 2020-04-23 DIAGNOSIS — Z299 Encounter for prophylactic measures, unspecified: Secondary | ICD-10-CM | POA: Diagnosis not present

## 2020-04-23 DIAGNOSIS — E1165 Type 2 diabetes mellitus with hyperglycemia: Secondary | ICD-10-CM | POA: Diagnosis not present

## 2020-05-09 DIAGNOSIS — E119 Type 2 diabetes mellitus without complications: Secondary | ICD-10-CM | POA: Diagnosis not present

## 2020-05-09 DIAGNOSIS — I10 Essential (primary) hypertension: Secondary | ICD-10-CM | POA: Diagnosis not present

## 2020-05-16 DIAGNOSIS — I10 Essential (primary) hypertension: Secondary | ICD-10-CM | POA: Diagnosis not present

## 2020-05-16 DIAGNOSIS — Z6827 Body mass index (BMI) 27.0-27.9, adult: Secondary | ICD-10-CM | POA: Diagnosis not present

## 2020-05-16 DIAGNOSIS — G8194 Hemiplegia, unspecified affecting left nondominant side: Secondary | ICD-10-CM | POA: Diagnosis not present

## 2020-05-16 DIAGNOSIS — Z299 Encounter for prophylactic measures, unspecified: Secondary | ICD-10-CM | POA: Diagnosis not present

## 2020-05-16 DIAGNOSIS — I639 Cerebral infarction, unspecified: Secondary | ICD-10-CM | POA: Diagnosis not present

## 2020-05-16 DIAGNOSIS — Z87891 Personal history of nicotine dependence: Secondary | ICD-10-CM | POA: Diagnosis not present

## 2020-05-16 DIAGNOSIS — D692 Other nonthrombocytopenic purpura: Secondary | ICD-10-CM | POA: Diagnosis not present

## 2020-06-04 DIAGNOSIS — M5416 Radiculopathy, lumbar region: Secondary | ICD-10-CM | POA: Diagnosis not present

## 2020-06-04 DIAGNOSIS — M4722 Other spondylosis with radiculopathy, cervical region: Secondary | ICD-10-CM | POA: Diagnosis not present

## 2020-06-04 DIAGNOSIS — M4807 Spinal stenosis, lumbosacral region: Secondary | ICD-10-CM | POA: Diagnosis not present

## 2020-06-04 DIAGNOSIS — M5030 Other cervical disc degeneration, unspecified cervical region: Secondary | ICD-10-CM | POA: Diagnosis not present

## 2020-06-13 DIAGNOSIS — I10 Essential (primary) hypertension: Secondary | ICD-10-CM | POA: Diagnosis not present

## 2020-06-13 DIAGNOSIS — F339 Major depressive disorder, recurrent, unspecified: Secondary | ICD-10-CM | POA: Diagnosis not present

## 2020-06-13 DIAGNOSIS — G8194 Hemiplegia, unspecified affecting left nondominant side: Secondary | ICD-10-CM | POA: Diagnosis not present

## 2020-06-13 DIAGNOSIS — Z299 Encounter for prophylactic measures, unspecified: Secondary | ICD-10-CM | POA: Diagnosis not present

## 2020-06-13 DIAGNOSIS — E1165 Type 2 diabetes mellitus with hyperglycemia: Secondary | ICD-10-CM | POA: Diagnosis not present

## 2020-07-11 DIAGNOSIS — I2510 Atherosclerotic heart disease of native coronary artery without angina pectoris: Secondary | ICD-10-CM | POA: Diagnosis not present

## 2020-07-11 DIAGNOSIS — I69952 Hemiplegia and hemiparesis following unspecified cerebrovascular disease affecting left dominant side: Secondary | ICD-10-CM | POA: Diagnosis not present

## 2020-07-11 DIAGNOSIS — Z299 Encounter for prophylactic measures, unspecified: Secondary | ICD-10-CM | POA: Diagnosis not present

## 2020-07-11 DIAGNOSIS — I10 Essential (primary) hypertension: Secondary | ICD-10-CM | POA: Diagnosis not present

## 2020-07-11 DIAGNOSIS — M549 Dorsalgia, unspecified: Secondary | ICD-10-CM | POA: Diagnosis not present

## 2020-08-01 DIAGNOSIS — I2510 Atherosclerotic heart disease of native coronary artery without angina pectoris: Secondary | ICD-10-CM | POA: Diagnosis not present

## 2020-08-14 DIAGNOSIS — G8194 Hemiplegia, unspecified affecting left nondominant side: Secondary | ICD-10-CM | POA: Diagnosis not present

## 2020-08-14 DIAGNOSIS — Z299 Encounter for prophylactic measures, unspecified: Secondary | ICD-10-CM | POA: Diagnosis not present

## 2020-08-14 DIAGNOSIS — I10 Essential (primary) hypertension: Secondary | ICD-10-CM | POA: Diagnosis not present

## 2020-08-14 DIAGNOSIS — E1165 Type 2 diabetes mellitus with hyperglycemia: Secondary | ICD-10-CM | POA: Diagnosis not present

## 2020-08-26 DIAGNOSIS — I10 Essential (primary) hypertension: Secondary | ICD-10-CM | POA: Diagnosis not present

## 2020-08-26 DIAGNOSIS — L03114 Cellulitis of left upper limb: Secondary | ICD-10-CM | POA: Diagnosis not present

## 2020-08-26 DIAGNOSIS — Z299 Encounter for prophylactic measures, unspecified: Secondary | ICD-10-CM | POA: Diagnosis not present

## 2020-08-26 DIAGNOSIS — I25119 Atherosclerotic heart disease of native coronary artery with unspecified angina pectoris: Secondary | ICD-10-CM | POA: Diagnosis not present

## 2020-08-26 DIAGNOSIS — N183 Chronic kidney disease, stage 3 unspecified: Secondary | ICD-10-CM | POA: Diagnosis not present

## 2020-09-02 DIAGNOSIS — M5030 Other cervical disc degeneration, unspecified cervical region: Secondary | ICD-10-CM | POA: Diagnosis not present

## 2020-09-02 DIAGNOSIS — Z79891 Long term (current) use of opiate analgesic: Secondary | ICD-10-CM | POA: Diagnosis not present

## 2020-09-02 DIAGNOSIS — M4722 Other spondylosis with radiculopathy, cervical region: Secondary | ICD-10-CM | POA: Diagnosis not present

## 2020-09-04 DIAGNOSIS — Z79891 Long term (current) use of opiate analgesic: Secondary | ICD-10-CM | POA: Diagnosis not present

## 2020-09-16 DIAGNOSIS — I10 Essential (primary) hypertension: Secondary | ICD-10-CM | POA: Diagnosis not present

## 2020-09-16 DIAGNOSIS — Z299 Encounter for prophylactic measures, unspecified: Secondary | ICD-10-CM | POA: Diagnosis not present

## 2020-09-16 DIAGNOSIS — E1165 Type 2 diabetes mellitus with hyperglycemia: Secondary | ICD-10-CM | POA: Diagnosis not present

## 2020-09-16 DIAGNOSIS — G8194 Hemiplegia, unspecified affecting left nondominant side: Secondary | ICD-10-CM | POA: Diagnosis not present

## 2020-10-18 DIAGNOSIS — I639 Cerebral infarction, unspecified: Secondary | ICD-10-CM | POA: Diagnosis not present

## 2020-10-18 DIAGNOSIS — I10 Essential (primary) hypertension: Secondary | ICD-10-CM | POA: Diagnosis not present

## 2020-10-18 DIAGNOSIS — Z299 Encounter for prophylactic measures, unspecified: Secondary | ICD-10-CM | POA: Diagnosis not present

## 2020-11-18 DIAGNOSIS — E1122 Type 2 diabetes mellitus with diabetic chronic kidney disease: Secondary | ICD-10-CM | POA: Diagnosis not present

## 2020-11-18 DIAGNOSIS — I10 Essential (primary) hypertension: Secondary | ICD-10-CM | POA: Diagnosis not present

## 2020-11-18 DIAGNOSIS — I639 Cerebral infarction, unspecified: Secondary | ICD-10-CM | POA: Diagnosis not present

## 2020-11-18 DIAGNOSIS — Z6826 Body mass index (BMI) 26.0-26.9, adult: Secondary | ICD-10-CM | POA: Diagnosis not present

## 2020-11-18 DIAGNOSIS — Z299 Encounter for prophylactic measures, unspecified: Secondary | ICD-10-CM | POA: Diagnosis not present

## 2020-11-18 DIAGNOSIS — E1165 Type 2 diabetes mellitus with hyperglycemia: Secondary | ICD-10-CM | POA: Diagnosis not present

## 2020-11-18 DIAGNOSIS — E1129 Type 2 diabetes mellitus with other diabetic kidney complication: Secondary | ICD-10-CM | POA: Diagnosis not present

## 2020-12-09 DIAGNOSIS — F419 Anxiety disorder, unspecified: Secondary | ICD-10-CM | POA: Diagnosis not present

## 2020-12-09 DIAGNOSIS — Z6825 Body mass index (BMI) 25.0-25.9, adult: Secondary | ICD-10-CM | POA: Diagnosis not present

## 2020-12-09 DIAGNOSIS — Z1211 Encounter for screening for malignant neoplasm of colon: Secondary | ICD-10-CM | POA: Diagnosis not present

## 2020-12-09 DIAGNOSIS — R5383 Other fatigue: Secondary | ICD-10-CM | POA: Diagnosis not present

## 2020-12-09 DIAGNOSIS — Z79899 Other long term (current) drug therapy: Secondary | ICD-10-CM | POA: Diagnosis not present

## 2020-12-09 DIAGNOSIS — E7800 Pure hypercholesterolemia, unspecified: Secondary | ICD-10-CM | POA: Diagnosis not present

## 2020-12-09 DIAGNOSIS — Z7189 Other specified counseling: Secondary | ICD-10-CM | POA: Diagnosis not present

## 2020-12-09 DIAGNOSIS — Z1331 Encounter for screening for depression: Secondary | ICD-10-CM | POA: Diagnosis not present

## 2020-12-09 DIAGNOSIS — I10 Essential (primary) hypertension: Secondary | ICD-10-CM | POA: Diagnosis not present

## 2020-12-09 DIAGNOSIS — Z1339 Encounter for screening examination for other mental health and behavioral disorders: Secondary | ICD-10-CM | POA: Diagnosis not present

## 2020-12-09 DIAGNOSIS — Z0000 Encounter for general adult medical examination without abnormal findings: Secondary | ICD-10-CM | POA: Diagnosis not present

## 2020-12-10 DIAGNOSIS — E7800 Pure hypercholesterolemia, unspecified: Secondary | ICD-10-CM | POA: Diagnosis not present

## 2020-12-10 DIAGNOSIS — Z79899 Other long term (current) drug therapy: Secondary | ICD-10-CM | POA: Diagnosis not present

## 2020-12-10 DIAGNOSIS — E559 Vitamin D deficiency, unspecified: Secondary | ICD-10-CM | POA: Diagnosis not present

## 2020-12-10 DIAGNOSIS — R5383 Other fatigue: Secondary | ICD-10-CM | POA: Diagnosis not present

## 2020-12-10 DIAGNOSIS — F419 Anxiety disorder, unspecified: Secondary | ICD-10-CM | POA: Diagnosis not present

## 2020-12-26 DIAGNOSIS — I69359 Hemiplegia and hemiparesis following cerebral infarction affecting unspecified side: Secondary | ICD-10-CM | POA: Diagnosis not present

## 2020-12-26 DIAGNOSIS — I10 Essential (primary) hypertension: Secondary | ICD-10-CM | POA: Diagnosis not present

## 2020-12-26 DIAGNOSIS — I639 Cerebral infarction, unspecified: Secondary | ICD-10-CM | POA: Diagnosis not present

## 2020-12-26 DIAGNOSIS — E1165 Type 2 diabetes mellitus with hyperglycemia: Secondary | ICD-10-CM | POA: Diagnosis not present

## 2020-12-26 DIAGNOSIS — Z299 Encounter for prophylactic measures, unspecified: Secondary | ICD-10-CM | POA: Diagnosis not present

## 2020-12-26 DIAGNOSIS — D692 Other nonthrombocytopenic purpura: Secondary | ICD-10-CM | POA: Diagnosis not present

## 2021-01-10 DIAGNOSIS — M5030 Other cervical disc degeneration, unspecified cervical region: Secondary | ICD-10-CM | POA: Diagnosis not present

## 2021-01-10 DIAGNOSIS — M4722 Other spondylosis with radiculopathy, cervical region: Secondary | ICD-10-CM | POA: Diagnosis not present

## 2021-01-10 DIAGNOSIS — G894 Chronic pain syndrome: Secondary | ICD-10-CM | POA: Diagnosis not present

## 2021-01-10 DIAGNOSIS — M5136 Other intervertebral disc degeneration, lumbar region: Secondary | ICD-10-CM | POA: Diagnosis not present

## 2021-01-14 DIAGNOSIS — E2839 Other primary ovarian failure: Secondary | ICD-10-CM | POA: Diagnosis not present

## 2021-01-27 DIAGNOSIS — Z23 Encounter for immunization: Secondary | ICD-10-CM | POA: Diagnosis not present

## 2021-01-27 DIAGNOSIS — I10 Essential (primary) hypertension: Secondary | ICD-10-CM | POA: Diagnosis not present

## 2021-01-27 DIAGNOSIS — Z299 Encounter for prophylactic measures, unspecified: Secondary | ICD-10-CM | POA: Diagnosis not present

## 2021-01-27 DIAGNOSIS — I639 Cerebral infarction, unspecified: Secondary | ICD-10-CM | POA: Diagnosis not present

## 2021-02-27 DIAGNOSIS — Z8673 Personal history of transient ischemic attack (TIA), and cerebral infarction without residual deficits: Secondary | ICD-10-CM | POA: Diagnosis not present

## 2021-02-27 DIAGNOSIS — F325 Major depressive disorder, single episode, in full remission: Secondary | ICD-10-CM | POA: Diagnosis not present

## 2021-02-27 DIAGNOSIS — I10 Essential (primary) hypertension: Secondary | ICD-10-CM | POA: Diagnosis not present

## 2021-02-27 DIAGNOSIS — E7800 Pure hypercholesterolemia, unspecified: Secondary | ICD-10-CM | POA: Diagnosis not present

## 2021-02-27 DIAGNOSIS — Z299 Encounter for prophylactic measures, unspecified: Secondary | ICD-10-CM | POA: Diagnosis not present

## 2021-03-06 DIAGNOSIS — R413 Other amnesia: Secondary | ICD-10-CM | POA: Diagnosis not present

## 2021-03-06 DIAGNOSIS — R059 Cough, unspecified: Secondary | ICD-10-CM | POA: Diagnosis not present

## 2021-03-06 DIAGNOSIS — Z299 Encounter for prophylactic measures, unspecified: Secondary | ICD-10-CM | POA: Diagnosis not present

## 2021-03-06 DIAGNOSIS — U071 COVID-19: Secondary | ICD-10-CM | POA: Diagnosis not present

## 2021-03-06 DIAGNOSIS — F419 Anxiety disorder, unspecified: Secondary | ICD-10-CM | POA: Diagnosis not present

## 2021-03-18 ENCOUNTER — Other Ambulatory Visit: Payer: Self-pay | Admitting: Internal Medicine

## 2021-03-18 DIAGNOSIS — Z1231 Encounter for screening mammogram for malignant neoplasm of breast: Secondary | ICD-10-CM

## 2021-03-31 DIAGNOSIS — Z8673 Personal history of transient ischemic attack (TIA), and cerebral infarction without residual deficits: Secondary | ICD-10-CM | POA: Diagnosis not present

## 2021-03-31 DIAGNOSIS — I10 Essential (primary) hypertension: Secondary | ICD-10-CM | POA: Diagnosis not present

## 2021-03-31 DIAGNOSIS — E1122 Type 2 diabetes mellitus with diabetic chronic kidney disease: Secondary | ICD-10-CM | POA: Diagnosis not present

## 2021-03-31 DIAGNOSIS — J019 Acute sinusitis, unspecified: Secondary | ICD-10-CM | POA: Diagnosis not present

## 2021-03-31 DIAGNOSIS — Z299 Encounter for prophylactic measures, unspecified: Secondary | ICD-10-CM | POA: Diagnosis not present

## 2021-03-31 DIAGNOSIS — Z6824 Body mass index (BMI) 24.0-24.9, adult: Secondary | ICD-10-CM | POA: Diagnosis not present

## 2021-03-31 DIAGNOSIS — E1165 Type 2 diabetes mellitus with hyperglycemia: Secondary | ICD-10-CM | POA: Diagnosis not present

## 2021-04-21 ENCOUNTER — Ambulatory Visit
Admission: RE | Admit: 2021-04-21 | Discharge: 2021-04-21 | Disposition: A | Discharge status: Home / Self Care | Payer: Medicare Other | Source: Ambulatory Visit | Attending: Internal Medicine | Admitting: Internal Medicine

## 2021-04-21 DIAGNOSIS — Z1231 Encounter for screening mammogram for malignant neoplasm of breast: Secondary | ICD-10-CM | POA: Diagnosis not present

## 2021-04-22 ENCOUNTER — Ambulatory Visit: Payer: Medicare Other

## 2021-04-24 ENCOUNTER — Other Ambulatory Visit: Payer: Self-pay | Admitting: Internal Medicine

## 2021-04-24 DIAGNOSIS — R928 Other abnormal and inconclusive findings on diagnostic imaging of breast: Secondary | ICD-10-CM

## 2021-05-13 ENCOUNTER — Emergency Department (HOSPITAL_COMMUNITY)
Admission: EM | Admit: 2021-05-13 | Discharge: 2021-05-13 | Disposition: A | Discharge status: Home / Self Care | Payer: Medicare Other | Attending: Emergency Medicine | Admitting: Emergency Medicine

## 2021-05-13 ENCOUNTER — Emergency Department (HOSPITAL_COMMUNITY): Payer: Medicare Other

## 2021-05-13 ENCOUNTER — Encounter (HOSPITAL_COMMUNITY): Payer: Self-pay | Admitting: *Deleted

## 2021-05-13 DIAGNOSIS — S52502A Unspecified fracture of the lower end of left radius, initial encounter for closed fracture: Secondary | ICD-10-CM | POA: Diagnosis not present

## 2021-05-13 DIAGNOSIS — W19XXXA Unspecified fall, initial encounter: Secondary | ICD-10-CM | POA: Insufficient documentation

## 2021-05-13 DIAGNOSIS — M25532 Pain in left wrist: Secondary | ICD-10-CM | POA: Insufficient documentation

## 2021-05-13 DIAGNOSIS — Y92009 Unspecified place in unspecified non-institutional (private) residence as the place of occurrence of the external cause: Secondary | ICD-10-CM | POA: Insufficient documentation

## 2021-05-13 DIAGNOSIS — M7989 Other specified soft tissue disorders: Secondary | ICD-10-CM | POA: Diagnosis not present

## 2021-05-13 DIAGNOSIS — Z5321 Procedure and treatment not carried out due to patient leaving prior to being seen by health care provider: Secondary | ICD-10-CM | POA: Diagnosis not present

## 2021-05-13 DIAGNOSIS — G8911 Acute pain due to trauma: Secondary | ICD-10-CM | POA: Insufficient documentation

## 2021-05-13 NOTE — ED Notes (Signed)
Pt states she needs to go home. Says that she will wrap her wrist and call her pcp in the am. Verbalizes understanding that she can come back here to the ed if she feels worse. Left with husband.

## 2021-05-13 NOTE — ED Triage Notes (Signed)
FELL AT HOME PAIN LEFT WRIST, ALSO HAS PAIN IN LEFT SIDE

## 2021-05-14 DIAGNOSIS — F17210 Nicotine dependence, cigarettes, uncomplicated: Secondary | ICD-10-CM | POA: Diagnosis not present

## 2021-05-14 DIAGNOSIS — W19XXXA Unspecified fall, initial encounter: Secondary | ICD-10-CM | POA: Diagnosis not present

## 2021-05-14 DIAGNOSIS — Z299 Encounter for prophylactic measures, unspecified: Secondary | ICD-10-CM | POA: Diagnosis not present

## 2021-05-14 DIAGNOSIS — M549 Dorsalgia, unspecified: Secondary | ICD-10-CM | POA: Diagnosis not present

## 2021-05-14 DIAGNOSIS — M25562 Pain in left knee: Secondary | ICD-10-CM | POA: Diagnosis not present

## 2021-05-14 DIAGNOSIS — I10 Essential (primary) hypertension: Secondary | ICD-10-CM | POA: Diagnosis not present

## 2021-05-14 DIAGNOSIS — S62102A Fracture of unspecified carpal bone, left wrist, initial encounter for closed fracture: Secondary | ICD-10-CM | POA: Diagnosis not present

## 2021-05-14 DIAGNOSIS — I639 Cerebral infarction, unspecified: Secondary | ICD-10-CM | POA: Diagnosis not present

## 2021-05-15 ENCOUNTER — Encounter: Payer: Self-pay | Admitting: Orthopaedic Surgery

## 2021-05-15 ENCOUNTER — Ambulatory Visit (INDEPENDENT_AMBULATORY_CARE_PROVIDER_SITE_OTHER): Payer: Medicare Other | Admitting: Orthopaedic Surgery

## 2021-05-15 ENCOUNTER — Other Ambulatory Visit: Payer: Self-pay

## 2021-05-15 DIAGNOSIS — S52532A Colles' fracture of left radius, initial encounter for closed fracture: Secondary | ICD-10-CM | POA: Diagnosis not present

## 2021-05-15 NOTE — Progress Notes (Signed)
Office Visit Note   Patient: Rachel Perkins           Date of Birth: April 05, 1951           MRN: [id number] Visit Date: 05/15/2021              Requested by: Monico Blitz, MD [address] [place],  [place] [address] PCP: Monico Blitz, MD   Assessment & Plan: Visit Diagnoses:  1. Colles' fracture of left radius, initial encounter for closed fracture     Plan: Velcro long forearm wrist splint applied.  She can remove it to wash her hand.  Elevation ice.  She had some oxycodone prescribed on 05/14/2021.  Recheck 3 weeks.  Follow-Up Instructions: Return in about 3 weeks (around 06/05/2021).   Orders:  No orders of the defined types were placed in this encounter.  No orders of the defined types were placed in this encounter.     Procedures: No procedures performed   Clinical Data: No additional findings.   Subjective: Chief Complaint  Patient presents with   Left Wrist - Pain    DOI 05/13/2021    HPI [age]-year-old female fell off the front porch she had step her car and then the ground with pain in her left wrist on 05/13/2021.  X-rays at [facility] demonstrates nondisplaced distal radius metaphyseal fracture.  She states she had extensive weight in the ED and told her after a 3-hour waited been initial 6 hours for anyone can see her.  She left with an Ace wrap over her wrist has been keeping it elevated with ice.  No past history of injury to her wrist in the past.  She does have hypertension type 2 diabetes and chronic pain.  Previous right MCA stroke and positive tobacco abuse.  Review of Systems all other systems noncontributory to HPI.   Objective: Vital Signs: Ht 5\' 2"  (1.575 m)    Wt 133 lb (60.3 kg)    BMI 24.33 kg/m   Physical Exam Constitutional:      Appearance: She is well-developed.  HENT:     Head: Normocephalic.     Right Ear: External ear normal.     Left Ear: External ear normal. There is no impacted cerumen.  Eyes:     Pupils: Pupils are equal, round, and  reactive to light.  Neck:     Thyroid: No thyromegaly.     Trachea: No tracheal deviation.  Cardiovascular:     Rate and Rhythm: Normal rate.  Pulmonary:     Effort: Pulmonary effort is normal.  Abdominal:     Palpations: Abdomen is soft.  Musculoskeletal:     Cervical back: No rigidity.  Skin:    General: Skin is warm and dry.  Neurological:     Mental Status: She is alert and oriented to person, place, and time.  Psychiatric:        Behavior: Behavior normal.    Ortho Exam patient has tenderness left distal radius sensation the hand is intact median nerve sensation.  No tenderness of the radial head at the elbow.  Specialty Comments:  No specialty comments available.  Imaging: No results found.   PMFS History: Patient Active Problem List   Diagnosis Date Noted   Colles' fracture of left radius, initial encounter for closed fracture 05/18/2021   Subtherapeutic international normalized ratio (INR)    Acute ischemic right MCA stroke ([place]) 05/18/2018   Chronic pain syndrome    Diabetes mellitus type 2  in nonobese [facility])    Sleep disturbance    NSTEMI (non-ST elevated myocardial infarction) ([place]) 09/25/2013   Coronary atherosclerosis of native coronary artery 09/25/2013   HTN (hypertension) 09/25/2013   Diabetes mellitus ([place]) 09/25/2013   Tobacco abuse 09/25/2013   Past Medical History:  Diagnosis Date   Anginal pain ([place])    Arthritis    CAD (coronary artery disease)    a. 09/25/13 s/p DES to [facility] and DES to pRCA.   Chronic left-sided back pain    has had ESI/Dr. Francesco Runner   DM (diabetes mellitus) ([place])    a. diet controlled.    HTN (hypertension)    Myocardial infarction ([place]) 09/2013   nstemi   Stroke [facility])    Tobacco abuse     Family History  Problem Relation Age of Onset   Heart failure Father 42   Cirrhosis Mother    Alcohol abuse Mother    Diabetes Sister    Breast cancer Sister        twin sister   Uterine cancer Sister     Past Surgical History:   Procedure Laterality Date   ABDOMINAL HYSTERECTOMY     ANKLE SURGERY     APPENDECTOMY     CORONARY ANGIOPLASTY  09/25/2013   des mid circumflex  des rca   LEFT HEART CATHETERIZATION WITH CORONARY ANGIOGRAM N/A 09/25/2013   Procedure: LEFT HEART CATHETERIZATION WITH CORONARY ANGIOGRAM;  Surgeon: Burnell Blanks, MD;  Location: [facility] CATH LAB;  Service: Cardiovascular;  Laterality: N/A;   skin grafts left arm     Social History   Occupational History   Occupation: Biochemist, clinical  Tobacco Use   Smoking status: Every Day    Packs/day: 0.50    Years: 50.00    Pack years: 25.00    Types: Cigarettes    Start date: 06/04/1964    Last attempt to quit: 11/16/2015    Years since quitting: 5.5   Smokeless tobacco: Never  Vaping Use   Vaping Use: Never used  Substance and Sexual Activity   Alcohol use: No    Alcohol/week: 0.0 standard drinks   Drug use: No   Sexual activity: Not on file

## 2021-05-18 DIAGNOSIS — S52532A Colles' fracture of left radius, initial encounter for closed fracture: Secondary | ICD-10-CM | POA: Insufficient documentation

## 2021-05-19 DIAGNOSIS — M5136 Other intervertebral disc degeneration, lumbar region: Secondary | ICD-10-CM | POA: Diagnosis not present

## 2021-05-19 DIAGNOSIS — M5030 Other cervical disc degeneration, unspecified cervical region: Secondary | ICD-10-CM | POA: Diagnosis not present

## 2021-05-19 DIAGNOSIS — M79602 Pain in left arm: Secondary | ICD-10-CM | POA: Diagnosis not present

## 2021-05-28 DIAGNOSIS — Z299 Encounter for prophylactic measures, unspecified: Secondary | ICD-10-CM | POA: Diagnosis not present

## 2021-05-28 DIAGNOSIS — Z87891 Personal history of nicotine dependence: Secondary | ICD-10-CM | POA: Diagnosis not present

## 2021-05-28 DIAGNOSIS — E1165 Type 2 diabetes mellitus with hyperglycemia: Secondary | ICD-10-CM | POA: Diagnosis not present

## 2021-05-28 DIAGNOSIS — I10 Essential (primary) hypertension: Secondary | ICD-10-CM | POA: Diagnosis not present

## 2021-05-28 DIAGNOSIS — E1122 Type 2 diabetes mellitus with diabetic chronic kidney disease: Secondary | ICD-10-CM | POA: Diagnosis not present

## 2021-05-28 DIAGNOSIS — Z8673 Personal history of transient ischemic attack (TIA), and cerebral infarction without residual deficits: Secondary | ICD-10-CM | POA: Diagnosis not present

## 2021-05-28 DIAGNOSIS — Z6824 Body mass index (BMI) 24.0-24.9, adult: Secondary | ICD-10-CM | POA: Diagnosis not present

## 2021-05-29 ENCOUNTER — Ambulatory Visit
Admission: RE | Admit: 2021-05-29 | Discharge: 2021-05-29 | Disposition: A | Discharge status: Home / Self Care | Payer: Medicare Other | Source: Ambulatory Visit | Attending: Internal Medicine | Admitting: Internal Medicine

## 2021-05-29 ENCOUNTER — Other Ambulatory Visit: Payer: Self-pay

## 2021-05-29 ENCOUNTER — Other Ambulatory Visit: Payer: Self-pay | Admitting: Internal Medicine

## 2021-05-29 DIAGNOSIS — N6489 Other specified disorders of breast: Secondary | ICD-10-CM

## 2021-05-29 DIAGNOSIS — R928 Other abnormal and inconclusive findings on diagnostic imaging of breast: Secondary | ICD-10-CM

## 2021-05-29 DIAGNOSIS — R922 Inconclusive mammogram: Secondary | ICD-10-CM | POA: Diagnosis not present

## 2021-06-05 ENCOUNTER — Other Ambulatory Visit: Payer: Self-pay

## 2021-06-05 ENCOUNTER — Encounter: Payer: Self-pay | Admitting: Orthopaedic Surgery

## 2021-06-05 ENCOUNTER — Ambulatory Visit: Payer: Self-pay

## 2021-06-05 ENCOUNTER — Ambulatory Visit (INDEPENDENT_AMBULATORY_CARE_PROVIDER_SITE_OTHER): Payer: Medicare Other | Admitting: Orthopaedic Surgery

## 2021-06-05 VITALS — Ht 62.0 in | Wt 133.0 lb

## 2021-06-05 DIAGNOSIS — S52532A Colles' fracture of left radius, initial encounter for closed fracture: Secondary | ICD-10-CM

## 2021-06-05 NOTE — Progress Notes (Signed)
Office Visit Note   Patient: Rachel Perkins           Date of Birth: Apr 21, 1951           MRN: [id number] Visit Date: 06/05/2021              Requested by: Monico Blitz, MD [address] [place],  [place] [address] PCP: Monico Blitz, MD   Assessment & Plan: Visit Diagnoses:  1. Colles' fracture of left radius, initial encounter for closed fracture     Plan: Return 3 weeks for repeat x-rays she will continue to keep her splint on all the time.  Follow-Up Instructions: No follow-ups on file.   Orders:  Orders Placed This Encounter  Procedures   XR Wrist Complete Left   No orders of the defined types were placed in this encounter.     Procedures: No procedures performed   Clinical Data: No additional findings.   Subjective: Chief Complaint  Patient presents with   Left Wrist - Fracture, Follow-up    DOI 05/13/2021    HPI follow-up distal radius fracture date of injury 05/13/2021.  She states she has had considerable pain.  She has been on chronic pain management oxycodone.  She has been keeping it elevated.  Review of Systems unchanged   Objective: Vital Signs: Ht 5\' 2"  (1.575 m)    Wt 133 lb (60.3 kg)    BMI 24.33 kg/m   Physical Exam unchanged  Ortho Exam sensation is intact persistent stiffness in the fingers median sensation is intact.  Specialty Comments:  No specialty comments available.  Imaging: No results found.   PMFS History: Patient Active Problem List   Diagnosis Date Noted   Colles' fracture of left radius, initial encounter for closed fracture 05/18/2021   Subtherapeutic international normalized ratio (INR)    Acute ischemic right MCA stroke ([place]) 05/18/2018   Chronic pain syndrome    Diabetes mellitus type 2 in nonobese [facility])    Sleep disturbance    NSTEMI (non-ST elevated myocardial infarction) ([place]) 09/25/2013   Coronary atherosclerosis of native coronary artery 09/25/2013   HTN (hypertension) 09/25/2013   Diabetes mellitus ([place])  09/25/2013   Tobacco abuse 09/25/2013   Past Medical History:  Diagnosis Date   Anginal pain ([place])    Arthritis    CAD (coronary artery disease)    a. 09/25/13 s/p DES to [facility] and DES to pRCA.   Chronic left-sided back pain    has had ESI/Dr. Francesco Runner   DM (diabetes mellitus) ([place])    a. diet controlled.    HTN (hypertension)    Myocardial infarction ([place]) 09/2013   nstemi   Stroke [facility])    Tobacco abuse     Family History  Problem Relation Age of Onset   Heart failure Father 81   Cirrhosis Mother    Alcohol abuse Mother    Diabetes Sister    Breast cancer Sister        twin sister   Uterine cancer Sister     Past Surgical History:  Procedure Laterality Date   ABDOMINAL HYSTERECTOMY     ANKLE SURGERY     APPENDECTOMY     CORONARY ANGIOPLASTY  09/25/2013   des mid circumflex  des rca   LEFT HEART CATHETERIZATION WITH CORONARY ANGIOGRAM N/A 09/25/2013   Procedure: LEFT HEART CATHETERIZATION WITH CORONARY ANGIOGRAM;  Surgeon: Burnell Blanks, MD;  Location: [facility] CATH LAB;  Service: Cardiovascular;  Laterality: N/A;   skin grafts left  arm     Social History   Occupational History   Occupation: Biochemist, clinical  Tobacco Use   Smoking status: Every Day    Packs/day: 0.50    Years: 50.00    Pack years: 25.00    Types: Cigarettes    Start date: 06/04/1964    Last attempt to quit: 11/16/2015    Years since quitting: 5.5   Smokeless tobacco: Never  Vaping Use   Vaping Use: Never used  Substance and Sexual Activity   Alcohol use: No    Alcohol/week: 0.0 standard drinks   Drug use: No   Sexual activity: Not on file

## 2021-06-09 ENCOUNTER — Other Ambulatory Visit: Payer: Self-pay

## 2021-06-09 ENCOUNTER — Ambulatory Visit
Admission: RE | Admit: 2021-06-09 | Discharge: 2021-06-09 | Disposition: A | Discharge status: Home / Self Care | Payer: Medicare Other | Source: Ambulatory Visit | Attending: Internal Medicine | Admitting: Internal Medicine

## 2021-06-09 DIAGNOSIS — N6011 Diffuse cystic mastopathy of right breast: Secondary | ICD-10-CM | POA: Diagnosis not present

## 2021-06-09 DIAGNOSIS — N6489 Other specified disorders of breast: Secondary | ICD-10-CM

## 2021-06-09 DIAGNOSIS — N6311 Unspecified lump in the right breast, upper outer quadrant: Secondary | ICD-10-CM | POA: Diagnosis not present

## 2021-06-09 HISTORY — PX: BREAST BIOPSY: SHX20

## 2021-06-25 DIAGNOSIS — Z299 Encounter for prophylactic measures, unspecified: Secondary | ICD-10-CM | POA: Diagnosis not present

## 2021-06-25 DIAGNOSIS — F325 Major depressive disorder, single episode, in full remission: Secondary | ICD-10-CM | POA: Diagnosis not present

## 2021-06-25 DIAGNOSIS — J019 Acute sinusitis, unspecified: Secondary | ICD-10-CM | POA: Diagnosis not present

## 2021-06-25 DIAGNOSIS — E1129 Type 2 diabetes mellitus with other diabetic kidney complication: Secondary | ICD-10-CM | POA: Diagnosis not present

## 2021-06-25 DIAGNOSIS — Z8673 Personal history of transient ischemic attack (TIA), and cerebral infarction without residual deficits: Secondary | ICD-10-CM | POA: Diagnosis not present

## 2021-07-03 ENCOUNTER — Ambulatory Visit (INDEPENDENT_AMBULATORY_CARE_PROVIDER_SITE_OTHER): Payer: Medicare Other

## 2021-07-03 ENCOUNTER — Encounter: Payer: Self-pay | Admitting: Orthopaedic Surgery

## 2021-07-03 ENCOUNTER — Other Ambulatory Visit: Payer: Self-pay

## 2021-07-03 ENCOUNTER — Ambulatory Visit (INDEPENDENT_AMBULATORY_CARE_PROVIDER_SITE_OTHER): Payer: Medicare Other | Admitting: Orthopaedic Surgery

## 2021-07-03 VITALS — Ht 62.0 in | Wt 133.0 lb

## 2021-07-03 DIAGNOSIS — M25512 Pain in left shoulder: Secondary | ICD-10-CM | POA: Diagnosis not present

## 2021-07-03 DIAGNOSIS — S52532A Colles' fracture of left radius, initial encounter for closed fracture: Secondary | ICD-10-CM

## 2021-07-03 NOTE — Progress Notes (Signed)
Office Visit Note   Patient: Rachel Perkins           Date of Birth: [birth year]           MRN: [id number] Visit Date: 07/03/2021              Requested by: Monico Blitz, MD [address] [place],  [place] [address] PCP: Monico Blitz, MD   Assessment & Plan: Visit Diagnoses:  1. Colles' fracture of left radius, initial encounter for closed fracture   2. Acute pain of left shoulder     Plan: Husband will use a wrap over the top of the door as a pulley.  She can lay down and use stick or broom for active assistive range of motion overhead.  She will call if she wants formal physical therapy but already today she is improved her shoulder abduction and flexion speed and I do not think she will need formal therapy for her shoulder.  Wrist fracture is healed discontinue wrist splint.  Range of motion exercises discussed for wrist return as needed.  Follow-Up Instructions: No follow-ups on file.   Orders:  Orders Placed This Encounter  Procedures   XR Shoulder Left   XR Wrist Complete Left   No orders of the defined types were placed in this encounter.     Procedures: No procedures performed   Clinical Data: No additional findings.   Subjective: Chief Complaint  Patient presents with   Left Wrist - Fracture, Follow-up    DOI 05/13/2021   Left Shoulder - Pain    HPI follow-up distal radius fracture date of injury 05/13/2021.  Splint is removed x-rays demonstrate interval healing 3 views.  Discontinue brace.  She had some stiffness in his shoulder but can get her arm up overhead.  Review of Systems Percocet pain management of note.   Objective: Vital Signs: Ht 5\' 2"  (1.575 m)    Wt 133 lb (60.3 kg)    BMI 24.33 kg/m   Physical Exam Constitutional:      Appearance: She is well-developed.  HENT:     Head: Normocephalic.     Right Ear: External ear normal.     Left Ear: External ear normal. There is no impacted cerumen.  Eyes:     Pupils: Pupils are equal, round, and reactive  to light.  Neck:     Thyroid: No thyromegaly.     Trachea: No tracheal deviation.  Cardiovascular:     Rate and Rhythm: Normal rate.  Pulmonary:     Effort: Pulmonary effort is normal.  Abdominal:     Palpations: Abdomen is soft.  Musculoskeletal:     Cervical back: No rigidity.  Skin:    General: Skin is warm and dry.  Neurological:     Mental Status: She is alert and oriented to person, place, and time.  Psychiatric:        Behavior: Behavior normal.    Ortho ExamPatient is able to get her arm up overhead on the left have some stiffness of her shoulder some problems reaching past posterior axillary line.  Specialty Comments:  No specialty comments available.  Imaging: No results found.   PMFS History: Patient Active Problem List   Diagnosis Date Noted   Colles' fracture of left radius, initial encounter for closed fracture 05/18/2021   Subtherapeutic international normalized ratio (INR)    Acute ischemic right MCA stroke ([place]) 05/18/2018   Chronic pain syndrome    Diabetes mellitus type 2 in  nonobese ([name])    Sleep disturbance    NSTEMI (non-ST elevated myocardial infarction) ([place]) 09/25/2013   Coronary atherosclerosis of native coronary artery 09/25/2013   HTN (hypertension) 09/25/2013   Diabetes mellitus ([place]) 09/25/2013   Tobacco abuse 09/25/2013   Past Medical History:  Diagnosis Date   Anginal pain ([place])    Arthritis    CAD (coronary artery disease)    a. 09/25/13 s/p DES to [facility] and DES to pRCA.   Chronic left-sided back pain    has had ESI/Dr. Francesco Runner   DM (diabetes mellitus) ([place])    a. diet controlled.    HTN (hypertension)    Myocardial infarction ([place]) 09/2013   nstemi   Stroke [facility])    Tobacco abuse     Family History  Problem Relation Age of Onset   Heart failure Father 71   Cirrhosis Mother    Alcohol abuse Mother    Diabetes Sister    Breast cancer Sister        twin sister   Uterine cancer Sister     Past Surgical History:   Procedure Laterality Date   ABDOMINAL HYSTERECTOMY     ANKLE SURGERY     APPENDECTOMY     BREAST BIOPSY Right 06/09/2021   CORONARY ANGIOPLASTY  09/25/2013   des mid circumflex  des rca   LEFT HEART CATHETERIZATION WITH CORONARY ANGIOGRAM N/A 09/25/2013   Procedure: LEFT HEART CATHETERIZATION WITH CORONARY ANGIOGRAM;  Surgeon: Burnell Blanks, MD;  Location: [facility] CATH LAB;  Service: Cardiovascular;  Laterality: N/A;   skin grafts left arm     Social History   Occupational History   Occupation: Biochemist, clinical  Tobacco Use   Smoking status: Every Day    Packs/day: 0.50    Years: 50.00    Pack years: 25.00    Types: Cigarettes    Start date: 06/04/1964    Last attempt to quit: 11/16/2015    Years since quitting: 5.6   Smokeless tobacco: Never  Vaping Use   Vaping Use: Never used  Substance and Sexual Activity   Alcohol use: No    Alcohol/week: 0.0 standard drinks   Drug use: No   Sexual activity: Not on file

## 2021-07-23 DIAGNOSIS — E1165 Type 2 diabetes mellitus with hyperglycemia: Secondary | ICD-10-CM | POA: Diagnosis not present

## 2021-07-23 DIAGNOSIS — I10 Essential (primary) hypertension: Secondary | ICD-10-CM | POA: Diagnosis not present

## 2021-07-23 DIAGNOSIS — Z8673 Personal history of transient ischemic attack (TIA), and cerebral infarction without residual deficits: Secondary | ICD-10-CM | POA: Diagnosis not present

## 2021-07-23 DIAGNOSIS — Z6824 Body mass index (BMI) 24.0-24.9, adult: Secondary | ICD-10-CM | POA: Diagnosis not present

## 2021-07-23 DIAGNOSIS — Z299 Encounter for prophylactic measures, unspecified: Secondary | ICD-10-CM | POA: Diagnosis not present

## 2021-07-23 DIAGNOSIS — F17210 Nicotine dependence, cigarettes, uncomplicated: Secondary | ICD-10-CM | POA: Diagnosis not present

## 2021-08-20 DIAGNOSIS — I10 Essential (primary) hypertension: Secondary | ICD-10-CM | POA: Diagnosis not present

## 2021-08-20 DIAGNOSIS — Z299 Encounter for prophylactic measures, unspecified: Secondary | ICD-10-CM | POA: Diagnosis not present

## 2021-08-20 DIAGNOSIS — F17210 Nicotine dependence, cigarettes, uncomplicated: Secondary | ICD-10-CM | POA: Diagnosis not present

## 2021-08-20 DIAGNOSIS — Z6824 Body mass index (BMI) 24.0-24.9, adult: Secondary | ICD-10-CM | POA: Diagnosis not present

## 2021-08-20 DIAGNOSIS — J309 Allergic rhinitis, unspecified: Secondary | ICD-10-CM | POA: Diagnosis not present

## 2021-08-20 DIAGNOSIS — Z8673 Personal history of transient ischemic attack (TIA), and cerebral infarction without residual deficits: Secondary | ICD-10-CM | POA: Diagnosis not present

## 2021-08-21 DIAGNOSIS — M5030 Other cervical disc degeneration, unspecified cervical region: Secondary | ICD-10-CM | POA: Diagnosis not present

## 2021-08-21 DIAGNOSIS — M25512 Pain in left shoulder: Secondary | ICD-10-CM | POA: Diagnosis not present

## 2021-08-21 DIAGNOSIS — G8929 Other chronic pain: Secondary | ICD-10-CM | POA: Diagnosis not present

## 2021-08-21 DIAGNOSIS — M5136 Other intervertebral disc degeneration, lumbar region: Secondary | ICD-10-CM | POA: Diagnosis not present

## 2021-10-01 DIAGNOSIS — I639 Cerebral infarction, unspecified: Secondary | ICD-10-CM | POA: Diagnosis not present

## 2021-10-01 DIAGNOSIS — Z6824 Body mass index (BMI) 24.0-24.9, adult: Secondary | ICD-10-CM | POA: Diagnosis not present

## 2021-10-01 DIAGNOSIS — E1122 Type 2 diabetes mellitus with diabetic chronic kidney disease: Secondary | ICD-10-CM | POA: Diagnosis not present

## 2021-10-01 DIAGNOSIS — Z299 Encounter for prophylactic measures, unspecified: Secondary | ICD-10-CM | POA: Diagnosis not present

## 2021-10-01 DIAGNOSIS — E1165 Type 2 diabetes mellitus with hyperglycemia: Secondary | ICD-10-CM | POA: Diagnosis not present

## 2021-10-01 DIAGNOSIS — I10 Essential (primary) hypertension: Secondary | ICD-10-CM | POA: Diagnosis not present

## 2021-10-29 DIAGNOSIS — I10 Essential (primary) hypertension: Secondary | ICD-10-CM | POA: Diagnosis not present

## 2021-10-29 DIAGNOSIS — M2550 Pain in unspecified joint: Secondary | ICD-10-CM | POA: Diagnosis not present

## 2021-10-29 DIAGNOSIS — Z299 Encounter for prophylactic measures, unspecified: Secondary | ICD-10-CM | POA: Diagnosis not present

## 2021-10-29 DIAGNOSIS — I639 Cerebral infarction, unspecified: Secondary | ICD-10-CM | POA: Diagnosis not present

## 2021-10-29 DIAGNOSIS — E1165 Type 2 diabetes mellitus with hyperglycemia: Secondary | ICD-10-CM | POA: Diagnosis not present

## 2021-11-12 ENCOUNTER — Other Ambulatory Visit: Payer: Self-pay | Admitting: Internal Medicine

## 2021-11-12 DIAGNOSIS — Z09 Encounter for follow-up examination after completed treatment for conditions other than malignant neoplasm: Secondary | ICD-10-CM

## 2021-11-20 DIAGNOSIS — Z79891 Long term (current) use of opiate analgesic: Secondary | ICD-10-CM | POA: Diagnosis not present

## 2021-11-20 DIAGNOSIS — G894 Chronic pain syndrome: Secondary | ICD-10-CM | POA: Diagnosis not present

## 2021-11-20 DIAGNOSIS — M7062 Trochanteric bursitis, left hip: Secondary | ICD-10-CM | POA: Diagnosis not present

## 2021-11-26 DIAGNOSIS — R5383 Other fatigue: Secondary | ICD-10-CM | POA: Diagnosis not present

## 2021-11-26 DIAGNOSIS — Z299 Encounter for prophylactic measures, unspecified: Secondary | ICD-10-CM | POA: Diagnosis not present

## 2021-11-26 DIAGNOSIS — Z8673 Personal history of transient ischemic attack (TIA), and cerebral infarction without residual deficits: Secondary | ICD-10-CM | POA: Diagnosis not present

## 2021-12-11 DIAGNOSIS — E119 Type 2 diabetes mellitus without complications: Secondary | ICD-10-CM | POA: Diagnosis not present

## 2021-12-11 DIAGNOSIS — H5089 Other specified strabismus: Secondary | ICD-10-CM | POA: Diagnosis not present

## 2021-12-11 DIAGNOSIS — H1045 Other chronic allergic conjunctivitis: Secondary | ICD-10-CM | POA: Diagnosis not present

## 2021-12-11 DIAGNOSIS — H35451 Secondary pigmentary degeneration, right eye: Secondary | ICD-10-CM | POA: Diagnosis not present

## 2021-12-11 DIAGNOSIS — H52223 Regular astigmatism, bilateral: Secondary | ICD-10-CM | POA: Diagnosis not present

## 2021-12-11 DIAGNOSIS — H5201 Hypermetropia, right eye: Secondary | ICD-10-CM | POA: Diagnosis not present

## 2021-12-11 DIAGNOSIS — Z9849 Cataract extraction status, unspecified eye: Secondary | ICD-10-CM | POA: Diagnosis not present

## 2021-12-11 DIAGNOSIS — H5000 Unspecified esotropia: Secondary | ICD-10-CM | POA: Diagnosis not present

## 2021-12-11 DIAGNOSIS — Z961 Presence of intraocular lens: Secondary | ICD-10-CM | POA: Diagnosis not present

## 2021-12-11 DIAGNOSIS — H524 Presbyopia: Secondary | ICD-10-CM | POA: Diagnosis not present

## 2021-12-12 ENCOUNTER — Ambulatory Visit
Admission: RE | Admit: 2021-12-12 | Discharge: 2021-12-12 | Disposition: A | Discharge status: Home / Self Care | Payer: Medicare Other | Source: Ambulatory Visit | Attending: Internal Medicine | Admitting: Internal Medicine

## 2021-12-12 DIAGNOSIS — R928 Other abnormal and inconclusive findings on diagnostic imaging of breast: Secondary | ICD-10-CM | POA: Diagnosis not present

## 2021-12-12 DIAGNOSIS — Z09 Encounter for follow-up examination after completed treatment for conditions other than malignant neoplasm: Secondary | ICD-10-CM

## 2021-12-12 DIAGNOSIS — N6489 Other specified disorders of breast: Secondary | ICD-10-CM | POA: Diagnosis not present

## 2021-12-31 DIAGNOSIS — I69359 Hemiplegia and hemiparesis following cerebral infarction affecting unspecified side: Secondary | ICD-10-CM | POA: Diagnosis not present

## 2021-12-31 DIAGNOSIS — Z299 Encounter for prophylactic measures, unspecified: Secondary | ICD-10-CM | POA: Diagnosis not present

## 2021-12-31 DIAGNOSIS — I10 Essential (primary) hypertension: Secondary | ICD-10-CM | POA: Diagnosis not present

## 2021-12-31 DIAGNOSIS — E1165 Type 2 diabetes mellitus with hyperglycemia: Secondary | ICD-10-CM | POA: Diagnosis not present

## 2022-01-06 DIAGNOSIS — I10 Essential (primary) hypertension: Secondary | ICD-10-CM | POA: Diagnosis not present

## 2022-01-06 DIAGNOSIS — L0390 Cellulitis, unspecified: Secondary | ICD-10-CM | POA: Diagnosis not present

## 2022-01-06 DIAGNOSIS — Z299 Encounter for prophylactic measures, unspecified: Secondary | ICD-10-CM | POA: Diagnosis not present

## 2022-01-06 IMAGING — MG MM DIGITAL SCREENING BILAT W/ TOMO AND CAD
8 series · 8 of 24 positions shown · non-contrast
Comparison: Previous exam(s).

CLINICAL DATA: Screening.

EXAM:
DIGITAL SCREENING BILATERAL MAMMOGRAM WITH TOMOSYNTHESIS AND CAD
TECHNIQUE: Bilateral screening digital craniocaudal and mediolateral oblique
mammograms were obtained. Bilateral screening digital breast
tomosynthesis was performed. The images were evaluated with
computer-aided detection.

[R CC synth-2D]
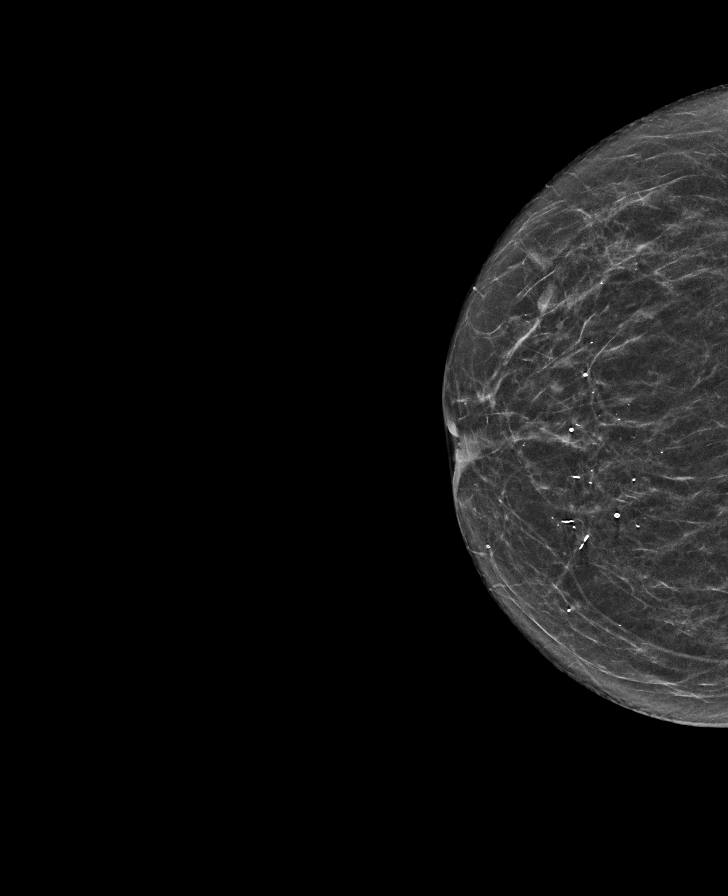

[L MLO synth-2D]
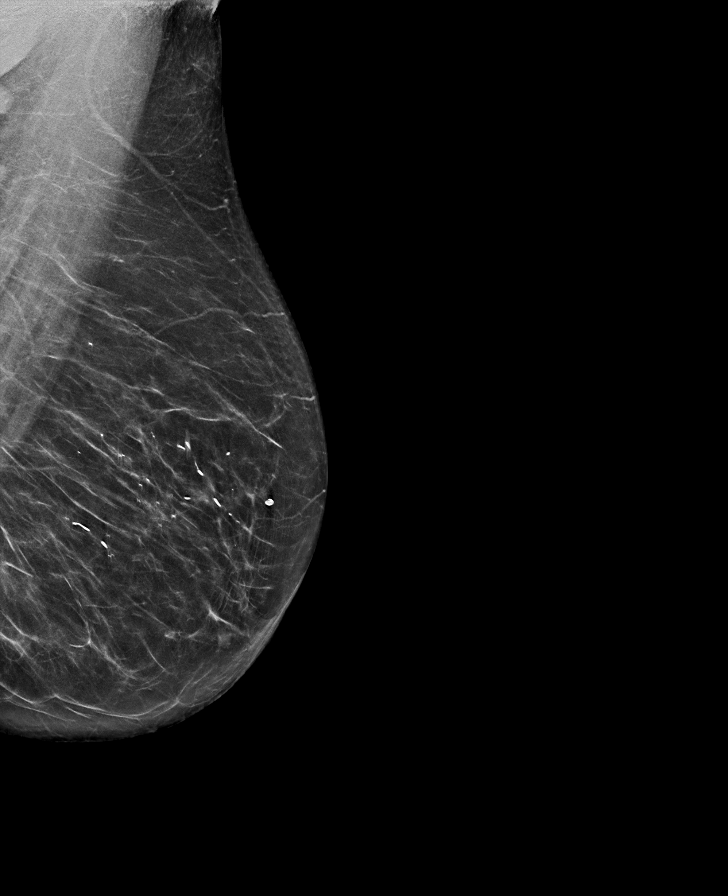

[R MLO synth-2D]
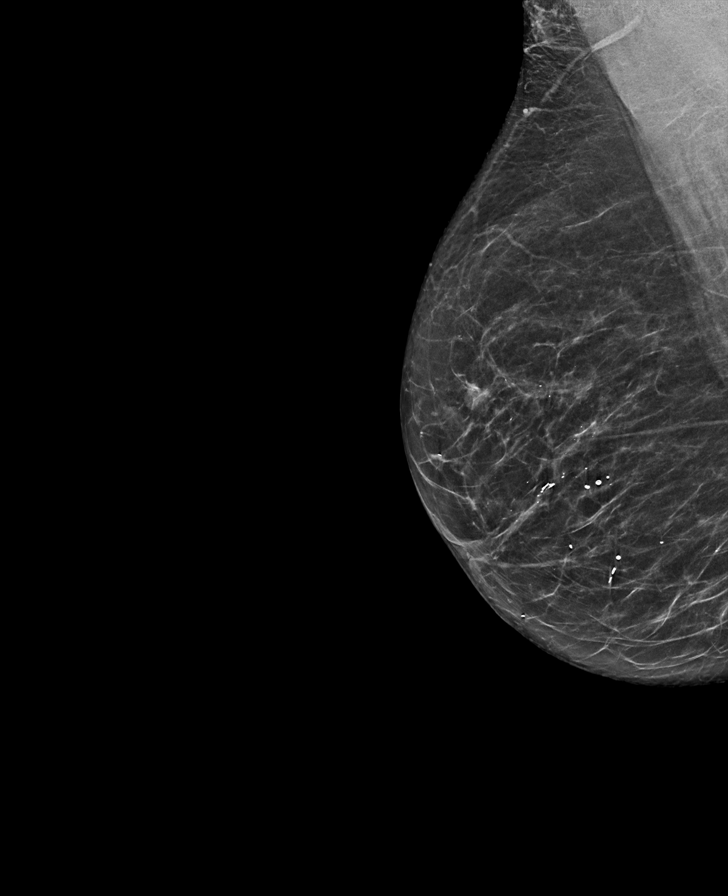

[L CC synth-2D]
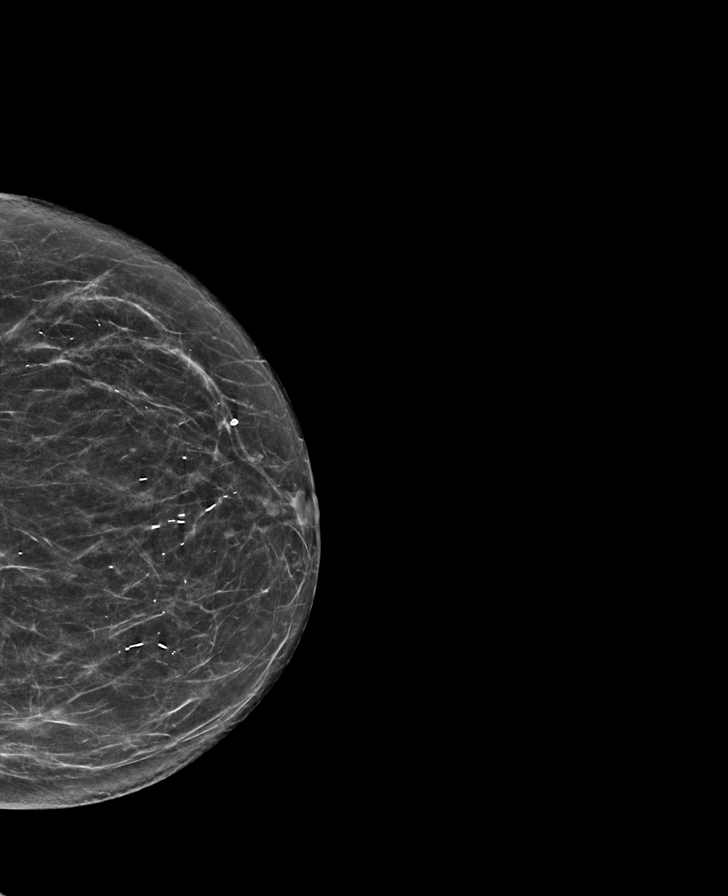

[R MLO tomo · tomo slice 31/61.0]
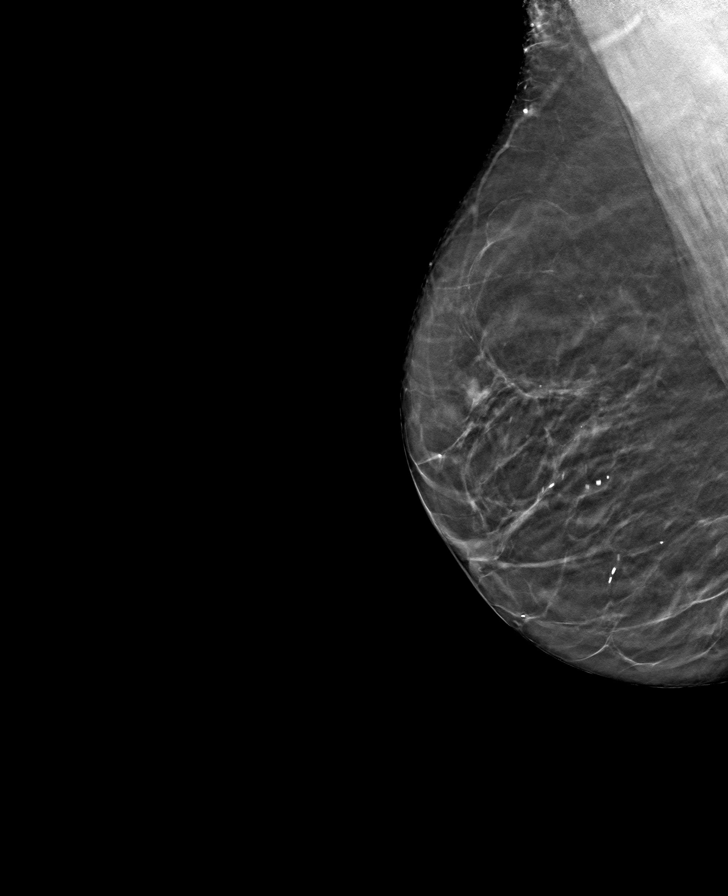

[L MLO tomo · tomo slice 35/69.0]
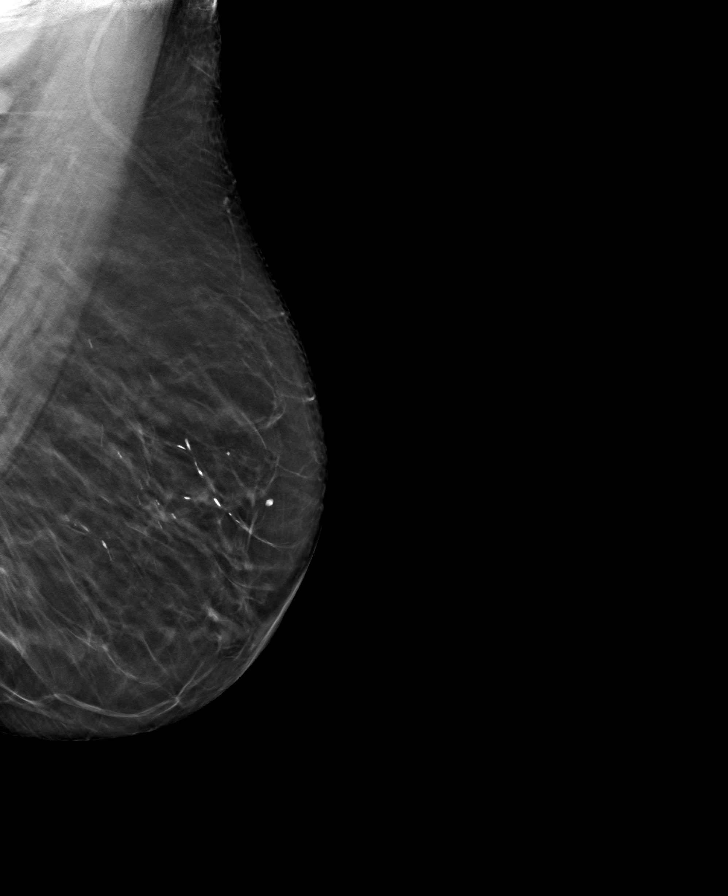

[R CC tomo · tomo slice 29/56.0]
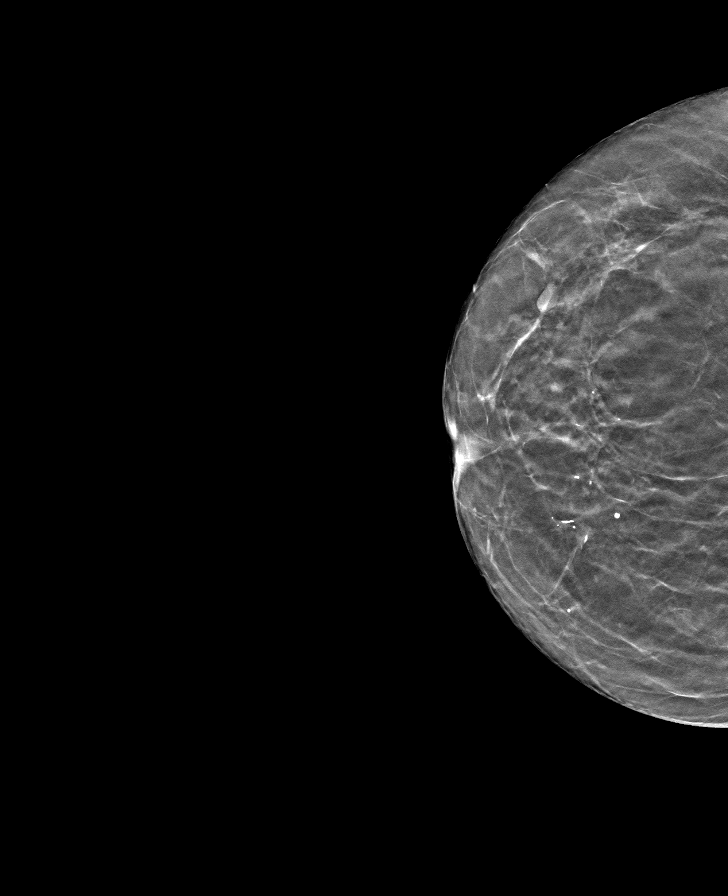

[L CC tomo · tomo slice 30/59.0]
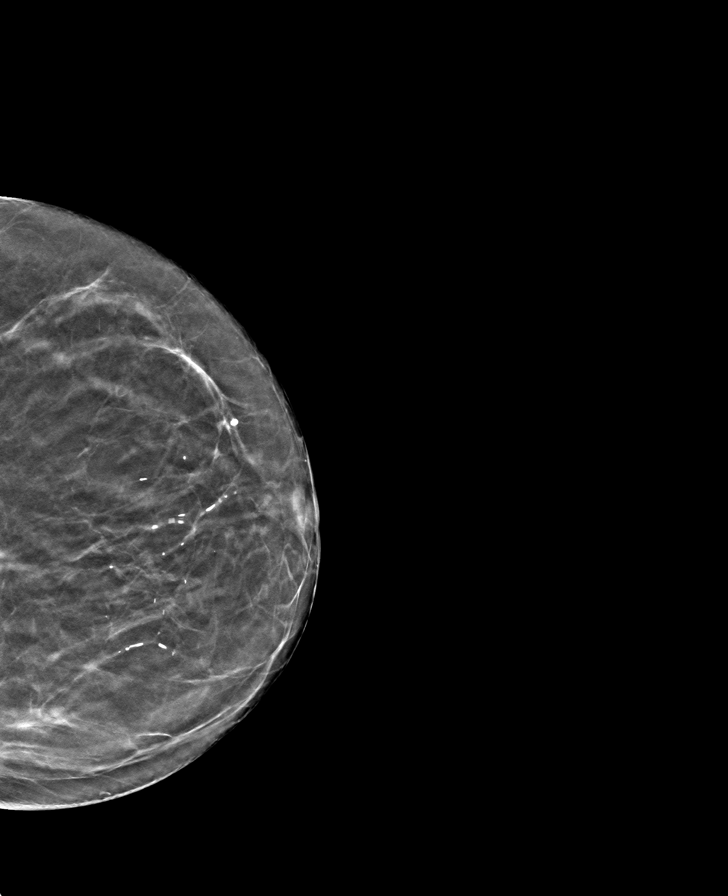

[8 of 24 positions shown; findings below may reference images not displayed]

ACR Breast Density Category b: There are scattered areas of
fibroglandular density.
FINDINGS: In the right breast, a possible mass and an adjacent possible focal
asymmetry warrant further evaluation. In the left breast, no
findings suspicious for malignancy.
IMPRESSION: Further evaluation is suggested for a possible mass and an adjacent
possible focal asymmetry in the right breast.

RECOMMENDATION:
Diagnostic mammogram and possibly ultrasound of the right breast.
(Code:II-C-FFN)

The patient will be contacted regarding the findings, and additional
imaging will be scheduled.

BI-RADS CATEGORY  0: Incomplete. Need additional imaging evaluation
and/or prior mammograms for comparison.

## 2022-01-16 ENCOUNTER — Other Ambulatory Visit: Payer: Self-pay | Admitting: Surgery

## 2022-01-16 DIAGNOSIS — N6311 Unspecified lump in the right breast, upper outer quadrant: Secondary | ICD-10-CM

## 2022-01-20 ENCOUNTER — Other Ambulatory Visit: Payer: Self-pay | Admitting: Surgery

## 2022-01-20 DIAGNOSIS — Z23 Encounter for immunization: Secondary | ICD-10-CM | POA: Diagnosis not present

## 2022-01-20 DIAGNOSIS — G8194 Hemiplegia, unspecified affecting left nondominant side: Secondary | ICD-10-CM | POA: Diagnosis not present

## 2022-01-20 DIAGNOSIS — J309 Allergic rhinitis, unspecified: Secondary | ICD-10-CM | POA: Diagnosis not present

## 2022-01-20 DIAGNOSIS — N6311 Unspecified lump in the right breast, upper outer quadrant: Secondary | ICD-10-CM

## 2022-01-20 DIAGNOSIS — Z299 Encounter for prophylactic measures, unspecified: Secondary | ICD-10-CM | POA: Diagnosis not present

## 2022-01-20 DIAGNOSIS — I10 Essential (primary) hypertension: Secondary | ICD-10-CM | POA: Diagnosis not present

## 2022-01-30 DIAGNOSIS — Z8673 Personal history of transient ischemic attack (TIA), and cerebral infarction without residual deficits: Secondary | ICD-10-CM | POA: Diagnosis not present

## 2022-01-30 DIAGNOSIS — I10 Essential (primary) hypertension: Secondary | ICD-10-CM | POA: Diagnosis not present

## 2022-01-30 DIAGNOSIS — E7800 Pure hypercholesterolemia, unspecified: Secondary | ICD-10-CM | POA: Diagnosis not present

## 2022-01-30 DIAGNOSIS — F17210 Nicotine dependence, cigarettes, uncomplicated: Secondary | ICD-10-CM | POA: Diagnosis not present

## 2022-01-30 DIAGNOSIS — Z1339 Encounter for screening examination for other mental health and behavioral disorders: Secondary | ICD-10-CM | POA: Diagnosis not present

## 2022-01-30 DIAGNOSIS — Z79899 Other long term (current) drug therapy: Secondary | ICD-10-CM | POA: Diagnosis not present

## 2022-01-30 DIAGNOSIS — R5383 Other fatigue: Secondary | ICD-10-CM | POA: Diagnosis not present

## 2022-01-30 DIAGNOSIS — Z1331 Encounter for screening for depression: Secondary | ICD-10-CM | POA: Diagnosis not present

## 2022-01-30 DIAGNOSIS — Z6824 Body mass index (BMI) 24.0-24.9, adult: Secondary | ICD-10-CM | POA: Diagnosis not present

## 2022-01-30 DIAGNOSIS — Z0000 Encounter for general adult medical examination without abnormal findings: Secondary | ICD-10-CM | POA: Diagnosis not present

## 2022-01-30 DIAGNOSIS — Z7189 Other specified counseling: Secondary | ICD-10-CM | POA: Diagnosis not present

## 2022-01-30 DIAGNOSIS — Z299 Encounter for prophylactic measures, unspecified: Secondary | ICD-10-CM | POA: Diagnosis not present

## 2022-02-04 DIAGNOSIS — Z79891 Long term (current) use of opiate analgesic: Secondary | ICD-10-CM | POA: Diagnosis not present

## 2022-02-04 DIAGNOSIS — M7062 Trochanteric bursitis, left hip: Secondary | ICD-10-CM | POA: Diagnosis not present

## 2022-02-04 DIAGNOSIS — G894 Chronic pain syndrome: Secondary | ICD-10-CM | POA: Diagnosis not present

## 2022-02-04 NOTE — Pre-Procedure Instructions (Signed)
Surgical Instructions    Your procedure is scheduled on Wednesday, October 4th.  Report to [facility] Main Entrance "A" at 07:45 A.M., then check in with the Admitting office.  Call this number if you have problems the morning of surgery:  [phone]   If you have any questions prior to your surgery date call [phone]: Open Monday-Friday 8am-4pm    Remember:  Do not eat after midnight the night before your surgery  You may drink clear liquids until 06:45 AM the morning of your surgery.   Clear liquids allowed are: Water, Non-Citrus Juices (without pulp), Carbonated Beverages, Clear Tea, Black Coffee Only (NO MILK, CREAM OR POWDERED CREAMER of any kind), and Gatorade.   Patient Instructions  The night before surgery:  No food after midnight. ONLY clear liquids after midnight   The day of surgery (if you have diabetes): Drink ONE (1) 12 oz G2 given to you in your pre admission testing appointment by 06:45 AM the morning of surgery. Drink in one sitting. Do not sip.  This drink was given to you during your hospital  pre-op appointment visit.  Nothing else to drink after completing the  12 oz bottle of G2.         If you have questions, please contact your surgeon's office.     Take these medicines the morning of surgery with A SIP OF WATER  atorvastatin (LIPITOR)  escitalopram (LEXAPRO) gabapentin (NEURONTIN)  metoprolol tartrate (LOPRESSOR)    If needed: acetaminophen (TYLENOL)  azelastine (ASTELIN) 0.1 % nasal spray cyclobenzaprine (FLEXERIL) nitroGLYCERIN (NITROSTAT)  oxyCODONE-acetaminophen (PERCOCET/ROXICET)  Hold warfarin (COUMADIN) 5 days prior to surgery.   Follow your surgeon's instructions on when to stop Aspirin.  If no instructions were given by your surgeon then you will need to call the office to get those instructions.     As of today, STOP taking any Aleve, Naproxen, Ibuprofen, Motrin, Advil, Goody's, BC's, all herbal medications, fish oil, and  all vitamins.  WHAT DO I DO ABOUT MY DIABETES MEDICATION?   Do not take metFORMIN (GLUCOPHAGE) the morning of surgery.    HOW TO MANAGE YOUR DIABETES BEFORE AND AFTER SURGERY  Why is it important to control my blood sugar before and after surgery? Improving blood sugar levels before and after surgery helps healing and can limit problems. A way of improving blood sugar control is eating a healthy diet by:  Eating less sugar and carbohydrates  Increasing activity/exercise  Talking with your doctor about reaching your blood sugar goals High blood sugars (greater than 180 mg/dL) can raise your risk of infections and slow your recovery, so you will need to focus on controlling your diabetes during the weeks before surgery. Make sure that the doctor who takes care of your diabetes knows about your planned surgery including the date and location.  How do I manage my blood sugar before surgery? Check your blood sugar at least 4 times a day, starting 2 days before surgery, to make sure that the level is not too high or low.  Check your blood sugar the morning of your surgery when you wake up and every 2 hours until you get to the Short Stay unit.  If your blood sugar is less than 70 mg/dL, you will need to treat for low blood sugar: Do not take insulin. Treat a low blood sugar (less than 70 mg/dL) with  cup of clear juice (cranberry or apple), 4 glucose tablets, OR glucose gel. Recheck blood sugar in 15  minutes after treatment (to make sure it is greater than 70 mg/dL). If your blood sugar is not greater than 70 mg/dL on recheck, call [phone] for further instructions. Report your blood sugar to the short stay nurse when you get to Short Stay.  If you are admitted to the hospital after surgery: Your blood sugar will be checked by the staff and you will probably be given insulin after surgery (instead of oral diabetes medicines) to make sure you have good blood sugar levels. The goal for  blood sugar control after surgery is 80-180 mg/dL.                    Do NOT Smoke (Tobacco/Vaping) for 24 hours prior to your procedure.  If you use a CPAP at night, you may bring your mask/headgear for your overnight stay.   Contacts, glasses, piercing's, hearing aid's, dentures or partials may not be worn into surgery, please bring cases for these belongings.    For patients admitted to the hospital, discharge time will be determined by your treatment team.   Patients discharged the day of surgery will not be allowed to drive home, and someone needs to stay with them for 24 hours.  SURGICAL WAITING ROOM VISITATION Patients having surgery or a procedure may have no more than 2 support people in the waiting area - these visitors may rotate.   Children under the age of 75 must have an adult with them who is not the patient. If the patient needs to stay at the hospital during part of their recovery, the visitor guidelines for inpatient rooms apply. Pre-op nurse will coordinate an appropriate time for 1 support person to accompany patient in pre-op.  This support person may not rotate.   Please refer to the [facility] website for the visitor guidelines for Inpatients (after your surgery is over and you are in a regular room).    Special instructions:   [place]- Preparing For Surgery  Before surgery, you can play an important role. Because skin is not sterile, your skin needs to be as free of germs as possible. You can reduce the number of germs on your skin by washing with CHG (chlorahexidine gluconate) Soap before surgery.  CHG is an antiseptic cleaner which kills germs and bonds with the skin to continue killing germs even after washing.    Oral Hygiene is also important to reduce your risk of infection.  Remember - BRUSH YOUR TEETH THE MORNING OF SURGERY WITH YOUR REGULAR TOOTHPASTE  Please do not use if you have an allergy to CHG or antibacterial soaps. If your skin becomes  reddened/irritated stop using the CHG.  Do not shave (including legs and underarms) for at least 48 hours prior to first CHG shower. It is OK to shave your face.  Please follow these instructions carefully.   Shower the NIGHT BEFORE SURGERY and the MORNING OF SURGERY  If you chose to wash your hair, wash your hair first as usual with your normal shampoo.  After you shampoo, rinse your hair and body thoroughly to remove the shampoo.  Use CHG Soap as you would any other liquid soap. You can apply CHG directly to the skin and wash gently with a scrungie or a clean washcloth.   Apply the CHG Soap to your body ONLY FROM THE NECK DOWN.  Do not use on open wounds or open sores. Avoid contact with your eyes, ears, mouth and genitals (private parts). Wash Face and genitals (  private parts)  with your normal soap.   Wash thoroughly, paying special attention to the area where your surgery will be performed.  Thoroughly rinse your body with warm water from the neck down.  DO NOT shower/wash with your normal soap after using and rinsing off the CHG Soap.  Pat yourself dry with a CLEAN TOWEL.  Wear CLEAN PAJAMAS to bed the night before surgery  Place CLEAN SHEETS on your bed the night before your surgery  DO NOT SLEEP WITH PETS.   Day of Surgery: Take a shower with CHG soap. Do not wear jewelry or makeup Do not wear lotions, powders, perfumes, or deodorant. Do not shave 48 hours prior to surgery.   Do not bring valuables to the hospital. [facility] is not responsible for any belongings or valuables. Do not wear nail polish, gel polish, artificial nails, or any other type of covering on natural nails (fingers and toes) If you have artificial nails or gel coating that need to be removed by a nail salon, please have this removed prior to surgery. Artificial nails or gel coating may interfere with anesthesia's ability to adequately monitor your vital signs. Wear Clean/Comfortable clothing the  morning of surgery Remember to brush your teeth WITH YOUR REGULAR TOOTHPASTE.   Please read over the following fact sheets that you were given.    If you received a COVID test during your pre-op visit  it is requested that you wear a mask when out in public, stay away from anyone that may not be feeling well and notify your surgeon if you develop symptoms. If you have been in contact with anyone that has tested positive in the last 10 days please notify you surgeon.

## 2022-02-05 ENCOUNTER — Other Ambulatory Visit: Payer: Self-pay

## 2022-02-05 ENCOUNTER — Encounter (HOSPITAL_COMMUNITY)
Admission: RE | Admit: 2022-02-05 | Discharge: 2022-02-05 | Disposition: A | Discharge status: Home / Self Care | Payer: Medicare Other | Source: Ambulatory Visit | Attending: Surgery | Admitting: Surgery

## 2022-02-05 ENCOUNTER — Encounter (HOSPITAL_COMMUNITY): Payer: Self-pay

## 2022-02-05 VITALS — BP 130/67 | HR 63 | Temp 97.9°F | Resp 17 | Ht 62.0 in | Wt 137.1 lb

## 2022-02-05 DIAGNOSIS — Z01818 Encounter for other preprocedural examination: Secondary | ICD-10-CM | POA: Diagnosis not present

## 2022-02-05 DIAGNOSIS — Z8673 Personal history of transient ischemic attack (TIA), and cerebral infarction without residual deficits: Secondary | ICD-10-CM | POA: Diagnosis not present

## 2022-02-05 DIAGNOSIS — I10 Essential (primary) hypertension: Secondary | ICD-10-CM | POA: Insufficient documentation

## 2022-02-05 DIAGNOSIS — E119 Type 2 diabetes mellitus without complications: Secondary | ICD-10-CM | POA: Diagnosis not present

## 2022-02-05 DIAGNOSIS — N6310 Unspecified lump in the right breast, unspecified quadrant: Secondary | ICD-10-CM | POA: Diagnosis not present

## 2022-02-05 DIAGNOSIS — Z87891 Personal history of nicotine dependence: Secondary | ICD-10-CM | POA: Insufficient documentation

## 2022-02-05 DIAGNOSIS — I214 Non-ST elevation (NSTEMI) myocardial infarction: Secondary | ICD-10-CM | POA: Diagnosis not present

## 2022-02-05 DIAGNOSIS — Z955 Presence of coronary angioplasty implant and graft: Secondary | ICD-10-CM | POA: Diagnosis not present

## 2022-02-05 DIAGNOSIS — Z7901 Long term (current) use of anticoagulants: Secondary | ICD-10-CM | POA: Diagnosis not present

## 2022-02-05 DIAGNOSIS — I2510 Atherosclerotic heart disease of native coronary artery without angina pectoris: Secondary | ICD-10-CM | POA: Insufficient documentation

## 2022-02-05 LAB — HEMOGLOBIN A1C
Hgb A1c MFr Bld: 6.7 % — ABNORMAL HIGH (ref 4.8–5.6)
Mean Plasma Glucose: 145.59 mg/dL

## 2022-02-05 LAB — GLUCOSE, CAPILLARY: Glucose-Capillary: 125 mg/dL — ABNORMAL HIGH (ref 70–99)

## 2022-02-05 NOTE — Anesthesia Preprocedure Evaluation (Addendum)
Anesthesia Evaluation  Patient identified by MRN, date of birth, ID band Patient awake    Reviewed: Allergy & Precautions, NPO status , Patient's Chart, lab work & pertinent test results, reviewed documented beta blocker date and time   Airway Mallampati: I       Dental no notable dental hx.    Pulmonary Current Smoker and Patient abstained from smoking.,    Pulmonary exam normal        Cardiovascular hypertension, Pt. on home beta blockers + CAD, + Past MI and + Cardiac Stents  Normal cardiovascular exam     Neuro/Psych PSYCHIATRIC DISORDERS Anxiety Depression CVA    GI/Hepatic negative GI ROS, Neg liver ROS,   Endo/Other  diabetes, Type 2  Renal/GU negative Renal ROS  negative genitourinary   Musculoskeletal   Abdominal Normal abdominal exam  (+)   Peds  Hematology   Anesthesia Other Findings   Reproductive/Obstetrics                            Anesthesia Physical Anesthesia Plan  ASA: 2  Anesthesia Plan: General   Post-op Pain Management: Minimal or no pain anticipated   Induction: Intravenous  PONV Risk Score and Plan: 2  Airway Management Planned: LMA  Additional Equipment: None  Intra-op Plan:   Post-operative Plan: Extubation in OR  Informed Consent: I have reviewed the patients History and Physical, chart, labs and discussed the procedure including the risks, benefits and alternatives for the proposed anesthesia with the patient or authorized representative who has indicated his/her understanding and acceptance.     Dental advisory given  Plan Discussed with: CRNA  Anesthesia Plan Comments: (PAT note written by Myra Gianotti, PA-C.  01/30/22 labs from [facility] included: CBC with diff, Lipid Panel, CMP, TSH--results included: WBC 9.1, hemoglobin 14.2, hematocrit 41.0, platelet count 251, glucose 120, BUN 13, creatinine 0.96, eGFR 62, sodium 141, potassium 4.3,  calcium 9.6, albumin 4.3, alkaline phosphatase 85, AST 16, ALT 19, TSH 2.580. (Copy of labs on shadow chart.))      Anesthesia Quick Evaluation

## 2022-02-05 NOTE — Progress Notes (Addendum)
Anesthesia Chart Review:  Case: [id number] Date/Time: 02/11/22 0930   Procedure: RIGHT BREAST LUMPECTOMY WITH RADIOACTIVE SEED LOCALIZATION (Right: Breast)   Anesthesia type: General   Pre-op diagnosis: RIGHT BREAST MASS   Location: [facility] OR ROOM 02 / [place] [facility]   Surgeons: Coralie Keens, MD       DISCUSSION: Patient is a 71 year old female scheduled for the above procedure. Per Dr. 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 Repeat echo 07/13/18 showed EF 60-65%, no AV mass), spinal surgery (ACDF 2021).   Last cardiology visit was on 08/01/20 with Zettie Pho, [place].  She was doing well from a cardiac standpoint. She is on warfarin for history of prior CVA. 1 year follow-up had been planned.  Patient evaluated at [facility] for routine physical on 01/30/22. Reportedly CBC and CMET were normal. She says surgery plans were discussed. Awaiting records.   I evaluated her during her PAT visit. She lives in [place], [place]. She works 2 8-hour shifts and 1 5-hour shift a [employer] and does a lot of walking there. 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 She is able to clean her  home including vacuuming. She does not have stairs. She denied chest pain, SOB, edema, syncope. At the time of her PCI in 2015, she presented with chest pain. She denied any recurrent symptoms. She denied use of Nitro. She feels well from a cardiac standpoint. She reported DM is overall well controlled, A1c 6.7%. She denied prior anesthesia complications. She was given permission to hold warfarin when she had ACDF by Dr. Newman Pies in 2021. Dr. Ninfa Linden advised she hold warfarin for 5 days prior to surgery. She will clarify ASA instructions with surgeon.   Awaiting last office note and labs from [facility]. She was last seen by cardiology in March 2022, but she continues to deny any CV/HF symptoms and stays active. CV testing in 2020 outlined below. PAT EKG showed NSR. Discussed with anesthesiologist Albertha Ghee, MD. Anesthesia team to evaluate on the day of surgery. She is for PT/INR on arrival.   ADDENDUM 02/09/22 12:05 PM: Received [facility] records. She had Medicare annual wellness visit on 01/30/22 with Dr. Manuella Ghazi. Plans for breast surgery noted. Labs that day included CBC with diff, Lipid Panel, CMP, TSH--results included: WBC 9.1, hemoglobin 14.2, hematocrit 41.0, platelet count 251, glucose 120, BUN 13, creatinine 0.96, eGFR 62, sodium 141, potassium 4.3, calcium 9.6, albumin 4.3, alkaline phosphatase 85, AST 16, ALT 19, TSH 2.580.    VS: BP 130/67   Pulse 63   Temp 36.6 C (Oral)   Resp 17   Ht '5\' 2"'  (1.575 m)   Wt 62.2 kg   SpO2 99%   BMI 25.08 kg/m Heart  RRR, no murmur noted. Lung clear. No carotid bruits. No ankle edema. Reported full dentures. Ambulated independently.    PROVIDERS: Monico Blitz, MD is PCP  Clovis Riley, MD is cardiologist [facility]).    LABS: A1c 6.7%. Awaiting 01/30/22 CBC and CMET from Dr. Manuella Ghazi. She is for PT/INR on the day of surgery. UPDATE: See DISCUSSION. (all labs ordered are listed, but only abnormal results are displayed)  Labs Reviewed  GLUCOSE,  CAPILLARY - Abnormal; Notable for the following components:      Result Value   Glucose-Capillary 125 (*)    All other components within normal limits  HEMOGLOBIN A1C - Abnormal; Notable for the following components:   Hgb A1c MFr Bld 6.7 (*)    All other components within normal limits    EEG Video Monitoring 05/13/18 (in setting of recent CVA, [facility]): Interpretation: This is an abnormal adult long term video EEG due to :  1.  Continuous right hemispheric slowing  2.  Brief myoclonic jerk bilateral arm/shoulder seen/captured.  This did not have any electrographic correlate.  3.  Generalized theta slowing of background frequencies.  Clinical Correlation:  Findings indicate:  1.  Right hemispheric dysfunction due to structural and/or functional abnormality  2.  Brief myoclonus seen as stated above did not have any electrographic correlate.  3.  Mild encephalopathy of nonspecific etiologies (diffuse or multifocal cerebral dysfunction).    EKG: 02/05/22: NSR   CV: Per 08/04/18 [facility] notes, "Echo 07/13/18 EF 60-65%, no aortic valve mass, no valve abnormalities." Report requested.   Cardiac cath 06/29/18: FINDINGS:  1. Left Main: Large caliber without significant disease.  2. LAD: Medium caliber vessel.  Proximally free of disease.  Midportion  and distally without significant disease.  One medium size diagonal  without significant disease.  3. Circumflex: Medium size nondominant.  Circumflex proper with 30% mid  disease.  Obtuse marginals without significant disease.    4. Right Coronary Artery: Medium size and dominant vessel.  Proximally  with 20% disease.  Patent stent in the midsegment.  30% diffuse mid  disease.  Right PDA and PLV branches free of disease.    5. Hemodynamics: Aortic pressure 74/43     CONCLUSIONS:  1. Mild to moderate nonobstructive coronary artery disease  2. Right radial access  3. Patient will be vented for anticipated cardiothoracic surgery  tomorrow  for aortic valve fibroblastoma.   TEE 05/13/18 ([facility]): Interpretation Summary  A complete 2D transesophageal echocardiogram with color flow Doppler and  spectral Doppler was performed. When the patient was appropriately sedated,  the Transesophageal probe was passed to the distal esophageal without  difficulty. A three-dimenisonal acquisition was performed. The left  ventricle is normal in size.  There are no thrombi visualized in the left atrium or the left atrial  appendage.  There is a mobile lesion near the right coronary cusp. It is unclear if it  is attached to the base of the cusp or the wall of the coronary sinus. It  has the appearence of a papillary fibroelastoma although mobile atheroma,  thrombus, and endocarditis are also in the differential diagnosis. Clinical  correlation requied.    Echo 05/12/18 ([facility]): Interpretation Summary  A complete portable two-dimensional transthoracic echocardiogram with color  flow Doppler and Spectral Doppler was performed. The study was technically  adequate. Saline contrast injection was performed. The left ventricle is  normal in size, wall thickness and wall motion with ejection fraction of  55-60%.  Unable to  adequately determine diastolic dysfunction.  Injection of contrast documented no interatrial shunt .  Mild aortic sclerosis is present with good valvular opening.    Past Medical History:  Diagnosis Date   Anginal pain ([place]) 09/25/2013   Arthritis    CAD (coronary artery disease)    a. 09/25/13 s/p DES to mLCx and DES to pRCA.   Chronic left-sided back pain    has had ESI/Dr. Francesco Runner   DM (diabetes mellitus) ([place])    HTN (hypertension)    Myocardial infarction ([place]) 09/2013   nstemi   Stroke ([place]) 05/11/2018   Tobacco abuse     Past Surgical History:  Procedure Laterality Date   ABDOMINAL HYSTERECTOMY     ANKLE SURGERY Left    APPENDECTOMY     BREAST BIOPSY Right 06/09/2021   CERVICAL SPINE  SURGERY  2021   pt thinks it was a fusion, not sure but it was with Dr. Arnoldo Morale   CORONARY ANGIOPLASTY  09/25/2013   des mid circumflex  des rca   LEFT HEART CATHETERIZATION WITH CORONARY ANGIOGRAM N/A 09/25/2013   Procedure: [place];  Surgeon: Burnell Blanks, MD;  [facility];  Service: Cardiovascular;  Laterality: N/A;   skin grafts right arm      MEDICATIONS:  acetaminophen (TYLENOL) 325 MG tablet   aspirin EC 81 MG tablet   atorvastatin (LIPITOR) 80 MG tablet   azelastine (ASTELIN) 0.1 % nasal spray   calcium-vitamin D (OSCAL WITH D) 500-200 MG-UNIT tablet   cholecalciferol (VITAMIN D3) 25 MCG (1000 UNIT) tablet   cyclobenzaprine (FLEXERIL) 10 MG tablet   escitalopram (LEXAPRO) 10 MG tablet   gabapentin (NEURONTIN) 300 MG capsule   metFORMIN (GLUCOPHAGE) 500 MG tablet   metoprolol tartrate (LOPRESSOR) 25 MG tablet   nitroGLYCERIN (NITROSTAT) 0.4 MG SL tablet   oxyCODONE-acetaminophen (PERCOCET/ROXICET) 5-325 MG tablet   warfarin (COUMADIN) 5 MG tablet   No current facility-administered medications for this encounter.    Myra Gianotti, PA-C Surgical Short Stay/Anesthesiology [facility] Phone [phone] [facility] Phone [phone] 02/05/2022 6:27 PM

## 2022-02-05 NOTE — Progress Notes (Signed)
PCP - Dr. Monico Blitz [facility] [place], [place]  PPM/ICD - denies   Chest x-ray - 06/30/18- [facility] EKG - 02/05/22 Stress Test - denies ECHO - 05/13/18- records requested Cardiac Cath - 09/25/13  Sleep Study - denies  DM- Type 2 Fasting Blood Sugar - 100-140 Checks Blood Sugar every other day  Blood Thinner Instructions: Stop Coumadin 5 days prior to surgery Aspirin Instructions: pt instructed to f/u with surgeon for instructions  ERAS Protcol - yes PRE-SURGERY G2- given at PAT  COVID TEST- n/a   Anesthesia review: yes, cardiac hx. Pt saw Ebony Hail, APP in PAT  Patient denies shortness of breath, fever, cough and chest pain at PAT appointment   All instructions explained to the patient, with a verbal understanding of the material. Patient agrees to go over the instructions while at home for a better understanding. The opportunity to ask questions was provided.

## 2022-02-10 ENCOUNTER — Ambulatory Visit
Admission: RE | Admit: 2022-02-10 | Discharge: 2022-02-10 | Disposition: A | Discharge status: Home / Self Care | Payer: Medicare Other | Source: Ambulatory Visit | Attending: Surgery | Admitting: Surgery

## 2022-02-10 DIAGNOSIS — N6311 Unspecified lump in the right breast, upper outer quadrant: Secondary | ICD-10-CM

## 2022-02-10 DIAGNOSIS — R928 Other abnormal and inconclusive findings on diagnostic imaging of breast: Secondary | ICD-10-CM | POA: Diagnosis not present

## 2022-02-10 NOTE — H&P (Signed)
REFERRING PHYSICIAN: Charlotte Crumb, MD  PROVIDER: Beverlee Nims, MD  MRN: [id number] DOB: 03/27/1951  Subjective   Chief Complaint: New Consultation (Right Breast excisional biopsy )   History of Present Illness: Rachel Perkins is a 71 y.o. female who is seen as an office consultation for evaluation of New Consultation (Right Breast excisional biopsy ) .   This is a 71 year old female who earlier this year was found to have a heterogeneous hypoechoic lesion in the posterior right breast at the 10 o'clock position. She underwent a biopsy showing chronic inflammation, fibrocystic changes, and columnar cells. This was in January of this year. On her follow-up mammogram that was performed in August, the area has persisted so surgical consultation was recommended to remove the area by the radiologist. An ultrasound of the axilla was unremarkable. She reports she has always had a chronically inverted nipple after having kids. She denies nipple discharge. She is on Coumadin for a heart valve issue and previous stroke. She has a family history of breast cancer and a twin sister.  Review of Systems: A complete review of systems was obtained from the patient. I have reviewed this information and discussed as appropriate with the patient. See HPI as well for other ROS.  ROS   Medical History: Past Medical History:  Diagnosis Date  Arthritis  Asthma, unspecified asthma severity, unspecified whether complicated, unspecified whether persistent  Diabetes mellitus without complication (CMS-[name])  Heart valve disease  History of stroke  Hyperlipidemia  Hypertension   Patient Active Problem List  Diagnosis  Acute ischemic right MCA stroke (CMS-[name])  Anaphylactic shock  Aortic regurgitation  Aortic valve mass  Asthma  Cervicalgia  Chronic pain syndrome  Colles' fracture of left radius, initial encounter for closed fracture  Coronary atherosclerosis of native coronary artery   Diabetes mellitus type 2 in nonobese (CMS-[name])  Encounter for long-term use of opiate analgesic  Essential hypertension, benign  Female stress incontinence  HTN (hypertension)  Hyperglycemia  Hyperlipidemia  Insomnia  Lumbar degenerative disc disease  Lumbar facet joint pain  Lumbar foraminal stenosis  Neural foraminal stenosis of cervical spine  NSTEMI (non-ST elevated myocardial infarction) (CMS-[name])  Pain in left arm  Prolonged depressive reaction  Radiculopathy, cervical region  Sleep disturbance  Spondylosis of lumbar spine  Subtherapeutic international normalized ratio (INR)  Tobacco abuse  Tubular adenoma of colon  Ulnar neuropathy of left upper extremity   Past Surgical History:  Procedure Laterality Date  HYSTERECTOMY  NECK SURGERY    Allergies  Allergen Reactions  Codeine Itching  Other Other (See Comments)  NUTS ?LOBSTER unknown  Penicillins Rash  DID THE REACTION INVOLVE: Swelling of the face/tongue/throat, SOB, or low BP? Unknown Sudden or severe rash/hives, skin peeling, or the inside of the mouth or nose? Unknown Did it require medical treatment? Unknown When did it last happen? unk If all above answers are "NO", may proceed with cephalosporin use.  Sulfa (Sulfonamide Antibiotics) Rash  Lisinopril Cough  Lorazepam Other (See Comments)  Other reaction(s): Confusion Hyper  Hyper   Current Outpatient Medications on File Prior to Visit  Medication Sig Dispense Refill  aspirin 81 MG EC tablet Take by mouth  atorvastatin (LIPITOR) 20 MG tablet Take by mouth  escitalopram oxalate (LEXAPRO) 5 MG tablet Take by mouth  gabapentin (NEURONTIN) 300 MG capsule Take 300 mg by mouth 3 (three) times daily  metFORMIN (GLUMETZA) 500 MG (MOD) ER tablet Take 500 mg by mouth daily with breakfast  metoprolol succinate (TOPROL-XL)  25 MG XL tablet Take 25 mg by mouth once daily  nitroGLYcerin (NITROSTAT) 0.4 MG SL tablet Place 0.4 mg under the tongue every 5 (five)  minutes as needed for Chest pain May take up to 3 doses.  oxyCODONE-acetaminophen (PERCOCET) 5-325 mg tablet Take by mouth 2 (two) times daily as needed  warfarin (COUMADIN) 5 MG tablet Take 5 mg by mouth once daily   No current facility-administered medications on file prior to visit.   History reviewed. No pertinent family history.   Social History   Tobacco Use  Smoking Status Every Day  Packs/day: 0.50  Types: Cigarettes  Smokeless Tobacco Never    Social History   Socioeconomic History  Marital status: Married  Tobacco Use  Smoking status: Every Day  Packs/day: 0.50  Types: Cigarettes  Smokeless tobacco: Never  Substance and Sexual Activity  Alcohol use: Not Currently  Drug use: Never   Objective:  There were no vitals filed for this visit.  There is no height or weight on file to calculate BMI.  Physical Exam   She appears well on exam.  There are no breast masses. There is slight inversion of the right nipple.  There is no axillary adenopathy.  Labs, Imaging and Diagnostic Testing: I reviewed her mammograms, ultrasound, pathology results.  Assessment and Plan:   Diagnoses and all orders for this visit:  Mass of upper outer quadrant of right breast    As the area in her right breast that was biopsied has persisted and there is a 6 mm mass in the area of concern, a radioactive seed guided right breast lumpectomy is strongly recommended to completely remove this area to rule out a malignancy. I discussed the reasonings for this with the patient and her husband in detail. We discussed the risk of surgery which includes but is not limited to bleeding, infection, injury to surrounding structures, the need for further surgery if malignancy is found, cardiopulmonary issues, postoperative recovery, etc.  We will hold her Coumadin 5 days preoperatively and we will start her back the evening of surgery.  She and her husband agree with the plans.

## 2022-02-11 ENCOUNTER — Encounter (HOSPITAL_COMMUNITY)
Admission: RE | Disposition: A | Discharge status: Home / Self Care | Payer: Self-pay | Source: Home / Self Care | Attending: Surgery

## 2022-02-11 ENCOUNTER — Ambulatory Visit (HOSPITAL_COMMUNITY): Payer: Medicare Other | Admitting: Vascular Surgery

## 2022-02-11 ENCOUNTER — Ambulatory Visit (HOSPITAL_COMMUNITY)
Admission: RE | Admit: 2022-02-11 | Discharge: 2022-02-11 | Disposition: A | Discharge status: Home / Self Care | Payer: Medicare Other | Attending: Surgery | Admitting: Surgery

## 2022-02-11 ENCOUNTER — Ambulatory Visit
Admission: RE | Admit: 2022-02-11 | Discharge: 2022-02-11 | Disposition: A | Discharge status: Home / Self Care | Payer: Medicare Other | Source: Ambulatory Visit | Attending: Surgery | Admitting: Surgery

## 2022-02-11 ENCOUNTER — Other Ambulatory Visit: Payer: Self-pay

## 2022-02-11 ENCOUNTER — Encounter (HOSPITAL_COMMUNITY): Payer: Self-pay | Admitting: Surgery

## 2022-02-11 ENCOUNTER — Ambulatory Visit (HOSPITAL_BASED_OUTPATIENT_CLINIC_OR_DEPARTMENT_OTHER): Payer: Medicare Other | Admitting: Vascular Surgery

## 2022-02-11 DIAGNOSIS — N6310 Unspecified lump in the right breast, unspecified quadrant: Secondary | ICD-10-CM

## 2022-02-11 DIAGNOSIS — F17210 Nicotine dependence, cigarettes, uncomplicated: Secondary | ICD-10-CM | POA: Insufficient documentation

## 2022-02-11 DIAGNOSIS — N6011 Diffuse cystic mastopathy of right breast: Secondary | ICD-10-CM | POA: Diagnosis not present

## 2022-02-11 DIAGNOSIS — Z7901 Long term (current) use of anticoagulants: Secondary | ICD-10-CM | POA: Insufficient documentation

## 2022-02-11 DIAGNOSIS — Z803 Family history of malignant neoplasm of breast: Secondary | ICD-10-CM | POA: Insufficient documentation

## 2022-02-11 DIAGNOSIS — I10 Essential (primary) hypertension: Secondary | ICD-10-CM | POA: Insufficient documentation

## 2022-02-11 DIAGNOSIS — E119 Type 2 diabetes mellitus without complications: Secondary | ICD-10-CM | POA: Diagnosis not present

## 2022-02-11 DIAGNOSIS — N6081 Other benign mammary dysplasias of right breast: Secondary | ICD-10-CM | POA: Diagnosis not present

## 2022-02-11 DIAGNOSIS — Z7984 Long term (current) use of oral hypoglycemic drugs: Secondary | ICD-10-CM | POA: Diagnosis not present

## 2022-02-11 DIAGNOSIS — R928 Other abnormal and inconclusive findings on diagnostic imaging of breast: Secondary | ICD-10-CM | POA: Diagnosis not present

## 2022-02-11 DIAGNOSIS — I2510 Atherosclerotic heart disease of native coronary artery without angina pectoris: Secondary | ICD-10-CM | POA: Diagnosis not present

## 2022-02-11 DIAGNOSIS — I252 Old myocardial infarction: Secondary | ICD-10-CM | POA: Insufficient documentation

## 2022-02-11 DIAGNOSIS — N6311 Unspecified lump in the right breast, upper outer quadrant: Secondary | ICD-10-CM

## 2022-02-11 DIAGNOSIS — F32A Depression, unspecified: Secondary | ICD-10-CM | POA: Diagnosis not present

## 2022-02-11 DIAGNOSIS — F419 Anxiety disorder, unspecified: Secondary | ICD-10-CM | POA: Diagnosis not present

## 2022-02-11 DIAGNOSIS — Z79899 Other long term (current) drug therapy: Secondary | ICD-10-CM | POA: Insufficient documentation

## 2022-02-11 DIAGNOSIS — Z8673 Personal history of transient ischemic attack (TIA), and cerebral infarction without residual deficits: Secondary | ICD-10-CM | POA: Insufficient documentation

## 2022-02-11 HISTORY — PX: BREAST LUMPECTOMY WITH RADIOACTIVE SEED LOCALIZATION: SHX6424

## 2022-02-11 HISTORY — PX: BREAST EXCISIONAL BIOPSY: SUR124

## 2022-02-11 LAB — APTT: aPTT: 28 seconds (ref 24–36)

## 2022-02-11 LAB — GLUCOSE, CAPILLARY
Glucose-Capillary: 150 mg/dL — ABNORMAL HIGH (ref 70–99)
Glucose-Capillary: 134 mg/dL — ABNORMAL HIGH (ref 70–99)
Glucose-Capillary: 174 mg/dL — ABNORMAL HIGH (ref 70–99)

## 2022-02-11 LAB — PROTIME-INR
INR: 1 (ref 0.8–1.2)
Prothrombin Time: 13.4 seconds (ref 11.4–15.2)

## 2022-02-11 SURGERY — BREAST LUMPECTOMY WITH RADIOACTIVE SEED LOCALIZATION
Anesthesia: General | Site: Breast | Laterality: Right

## 2022-02-11 MED ORDER — 0.9 % SODIUM CHLORIDE (POUR BTL) OPTIME
TOPICAL | Status: DC | PRN
  Administered 2022-02-11: 1000 mL

## 2022-02-11 MED ORDER — PROPOFOL 10 MG/ML IV BOLUS
INTRAVENOUS | Status: AC
  Filled 2022-02-11: qty 20

## 2022-02-11 MED ORDER — IBUPROFEN 100 MG/5ML PO SUSP: 200.0000 mg | Freq: Four times a day (QID) | ORAL | Status: DC | PRN

## 2022-02-11 MED ORDER — ORAL CARE MOUTH RINSE: 15.0000 mL | Freq: Once | OROMUCOSAL | Status: AC

## 2022-02-11 MED ORDER — BUPIVACAINE-EPINEPHRINE 0.25% -1:200000 IJ SOLN
INTRAMUSCULAR | Status: DC | PRN
  Administered 2022-02-11: 20 mL

## 2022-02-11 MED ORDER — ACETAMINOPHEN 500 MG PO TABS
1000.0000 mg | ORAL_TABLET | ORAL | Status: AC
  Administered 2022-02-11: 1000 mg via ORAL
  Filled 2022-02-11: qty 2

## 2022-02-11 MED ORDER — FENTANYL CITRATE (PF) 250 MCG/5ML IJ SOLN
INTRAMUSCULAR | Status: AC
  Filled 2022-02-11: qty 5

## 2022-02-11 MED ORDER — CHLORHEXIDINE GLUCONATE 0.12 % MT SOLN
15.0000 mL | Freq: Once | OROMUCOSAL | Status: AC
  Administered 2022-02-11: 15 mL via OROMUCOSAL
  Filled 2022-02-11: qty 15

## 2022-02-11 MED ORDER — PHENYLEPHRINE HCL-NACL 20-0.9 MG/250ML-% IV SOLN
INTRAVENOUS | Status: DC | PRN
  Administered 2022-02-11: 20 ug/min via INTRAVENOUS

## 2022-02-11 MED ORDER — FENTANYL CITRATE (PF) 100 MCG/2ML IJ SOLN: 25.0000 ug | INTRAMUSCULAR | Status: DC | PRN

## 2022-02-11 MED ORDER — OXYCODONE HCL 5 MG PO TABS: 5.0000 mg | ORAL_TABLET | Freq: Once | ORAL | Status: DC | PRN

## 2022-02-11 MED ORDER — MIDAZOLAM HCL 2 MG/2ML IJ SOLN
INTRAMUSCULAR | Status: AC
  Filled 2022-02-11: qty 2

## 2022-02-11 MED ORDER — ONDANSETRON HCL 4 MG/2ML IJ SOLN
INTRAMUSCULAR | Status: DC | PRN
  Administered 2022-02-11: 4 mg via INTRAVENOUS

## 2022-02-11 MED ORDER — PROPOFOL 10 MG/ML IV BOLUS
INTRAVENOUS | Status: DC | PRN
  Administered 2022-02-11: 130 mg via INTRAVENOUS

## 2022-02-11 MED ORDER — BUPIVACAINE-EPINEPHRINE (PF) 0.25% -1:200000 IJ SOLN
INTRAMUSCULAR | Status: AC
  Filled 2022-02-11: qty 30

## 2022-02-11 MED ORDER — DEXAMETHASONE SODIUM PHOSPHATE 10 MG/ML IJ SOLN
INTRAMUSCULAR | Status: AC
  Filled 2022-02-11: qty 1

## 2022-02-11 MED ORDER — IBUPROFEN 200 MG PO TABS: 200.0000 mg | ORAL_TABLET | Freq: Four times a day (QID) | ORAL | Status: DC | PRN

## 2022-02-11 MED ORDER — LACTATED RINGERS IV SOLN: INTRAVENOUS | Status: DC

## 2022-02-11 MED ORDER — INSULIN ASPART 100 UNIT/ML IJ SOLN: 0.0000 [IU] | INTRAMUSCULAR | Status: DC | PRN

## 2022-02-11 MED ORDER — OXYCODONE HCL 5 MG/5ML PO SOLN: 5.0000 mg | Freq: Once | ORAL | Status: DC | PRN

## 2022-02-11 MED ORDER — CIPROFLOXACIN IN D5W 400 MG/200ML IV SOLN
400.0000 mg | INTRAVENOUS | Status: AC
  Administered 2022-02-11: 400 mg via INTRAVENOUS
  Filled 2022-02-11: qty 200

## 2022-02-11 MED ORDER — LIDOCAINE 2% (20 MG/ML) 5 ML SYRINGE
INTRAMUSCULAR | Status: DC | PRN
  Administered 2022-02-11: 100 mg via INTRAVENOUS

## 2022-02-11 MED ORDER — ONDANSETRON HCL 4 MG/2ML IJ SOLN
INTRAMUSCULAR | Status: AC
  Filled 2022-02-11: qty 2

## 2022-02-11 MED ORDER — CHLORHEXIDINE GLUCONATE CLOTH 2 % EX PADS: 6.0000 | MEDICATED_PAD | Freq: Once | CUTANEOUS | Status: DC

## 2022-02-11 MED ORDER — FENTANYL CITRATE (PF) 250 MCG/5ML IJ SOLN
INTRAMUSCULAR | Status: DC | PRN
  Administered 2022-02-11: 25 ug via INTRAVENOUS

## 2022-02-11 MED ORDER — ONDANSETRON HCL 4 MG/2ML IJ SOLN: 4.0000 mg | Freq: Once | INTRAMUSCULAR | Status: DC | PRN

## 2022-02-11 SURGICAL SUPPLY — 31 items
APPLIER CLIP 9.375 MED OPEN (MISCELLANEOUS) ×1
BAG COUNTER SPONGE SURGICOUNT (BAG) ×1 IMPLANT
BINDER BREAST LRG (GAUZE/BANDAGES/DRESSINGS) IMPLANT
BINDER BREAST XLRG (GAUZE/BANDAGES/DRESSINGS) IMPLANT
CANISTER SUCT 3000ML PPV (MISCELLANEOUS) ×1 IMPLANT
CHLORAPREP W/TINT 26 (MISCELLANEOUS) ×1 IMPLANT
CLIP APPLIE 9.375 MED OPEN (MISCELLANEOUS) ×1 IMPLANT
COVER PROBE W GEL 5X96 (DRAPES) ×1 IMPLANT
COVER SURGICAL LIGHT HANDLE (MISCELLANEOUS) ×1 IMPLANT
DERMABOND ADVANCED .7 DNX12 (GAUZE/BANDAGES/DRESSINGS) ×1 IMPLANT
DEVICE DUBIN SPECIMEN MAMMOGRA (MISCELLANEOUS) ×1 IMPLANT
DRAPE CHEST BREAST 15X10 FENES (DRAPES) ×1 IMPLANT
ELECT CAUTERY BLADE 6.4 (BLADE) ×1 IMPLANT
ELECT REM PT RETURN 9FT ADLT (ELECTROSURGICAL) ×1
ELECTRODE REM PT RTRN 9FT ADLT (ELECTROSURGICAL) ×1 IMPLANT
GLOVE SURG SIGNA 7.5 PF LTX (GLOVE) ×1 IMPLANT
GOWN STRL REUS W/ TWL LRG LVL3 (GOWN DISPOSABLE) ×1 IMPLANT
GOWN STRL REUS W/ TWL XL LVL3 (GOWN DISPOSABLE) ×1 IMPLANT
GOWN STRL REUS W/TWL LRG LVL3 (GOWN DISPOSABLE) ×1
GOWN STRL REUS W/TWL XL LVL3 (GOWN DISPOSABLE) ×1
KIT BASIN OR (CUSTOM PROCEDURE TRAY) ×1 IMPLANT
KIT MARKER MARGIN INK (KITS) ×1 IMPLANT
NDL HYPO 25GX1X1/2 BEV (NEEDLE) ×1 IMPLANT
NEEDLE HYPO 25GX1X1/2 BEV (NEEDLE) ×1 IMPLANT
NS IRRIG 1000ML POUR BTL (IV SOLUTION) IMPLANT
PACK GENERAL/GYN (CUSTOM PROCEDURE TRAY) ×1 IMPLANT
SUT MNCRL AB 4-0 PS2 18 (SUTURE) ×1 IMPLANT
SUT VIC AB 3-0 SH 18 (SUTURE) ×1 IMPLANT
SYR CONTROL 10ML LL (SYRINGE) ×1 IMPLANT
TOWEL GREEN STERILE (TOWEL DISPOSABLE) ×1 IMPLANT
TOWEL GREEN STERILE FF (TOWEL DISPOSABLE) ×1 IMPLANT

## 2022-02-11 NOTE — Anesthesia Procedure Notes (Signed)
Procedure Name: LMA Insertion Date/Time: 02/11/2022 12:03 PM  Performed by: Flossie Dibble, CRNAPre-anesthesia Checklist: Patient identified, Patient being monitored, Emergency Drugs available, Timeout performed and Suction available Patient Re-evaluated:Patient Re-evaluated prior to induction Oxygen Delivery Method: Circle System Utilized Preoxygenation: Pre-oxygenation with 100% oxygen Induction Type: IV induction Ventilation: Mask ventilation without difficulty LMA: LMA inserted LMA Size: 4.0 Number of attempts: 1 (LMA inserted by Gifford Shave MD) Placement Confirmation: positive ETCO2 and breath sounds checked- equal and bilateral Tube secured with: Tape Dental Injury: Teeth and Oropharynx as per pre-operative assessment

## 2022-02-11 NOTE — Transfer of Care (Signed)
Immediate Anesthesia Transfer of Care Note  Patient: Rachel Perkins [facility]  Procedure(s) Performed: RIGHT BREAST LUMPECTOMY WITH RADIOACTIVE SEED LOCALIZATION (Right: Breast)  Patient Location: PACU  Anesthesia Type:General  Level of Consciousness: awake, alert , and oriented  Airway & Oxygen Therapy: Patient Spontanous Breathing  Post-op Assessment: Report given to RN and Post -op Vital signs reviewed and stable  Post vital signs: Reviewed and stable  Last Vitals:  Vitals Value Taken Time  BP 124/106 02/11/22 1245  Temp    Pulse 54 02/11/22 1247  Resp 20 02/11/22 1247  SpO2 93 % 02/11/22 1247  Vitals shown include unvalidated device data.  Last Pain:  Vitals:   02/11/22 0751  TempSrc: Oral         Complications: No notable events documented.

## 2022-02-11 NOTE — Discharge Instructions (Signed)
[place] Office Phone Number [phone]  BREAST BIOPSY/ PARTIAL MASTECTOMY: POST OP INSTRUCTIONS  Always review your discharge instruction sheet given to you by the facility where your surgery was performed.  IF YOU HAVE DISABILITY OR FAMILY LEAVE FORMS, YOU MUST BRING THEM TO THE OFFICE FOR PROCESSING.  DO NOT GIVE THEM TO YOUR DOCTOR.  A prescription for pain medication may be given to you upon discharge.  Take your pain medication as prescribed, if needed.  If narcotic pain medicine is not needed, then you may take acetaminophen (Tylenol) or ibuprofen (Advil) as needed. Take your usually prescribed medications unless otherwise directed If you need a refill on your pain medication, please contact your pharmacy.  They will contact our office to request authorization.  Prescriptions will not be filled after 5pm or on week-ends. You should eat very light the first 24 hours after surgery, such as soup, crackers, pudding, etc.  Resume your normal diet the day after surgery. Most patients will experience some swelling and bruising in the breast.  Ice packs and a good support bra will help.  Swelling and bruising can take several days to resolve.  It is common to experience some constipation if taking pain medication after surgery.  Increasing fluid intake and taking a stool softener will usually help or prevent this problem from occurring.  A mild laxative (Milk of Magnesia or Miralax) should be taken according to package directions if there are no bowel movements after 48 hours. Unless discharge instructions indicate otherwise, you may remove your bandages 24-48 hours after surgery, and you may shower at that time.  You may have steri-strips (small skin tapes) in place directly over the incision.  These strips should be left on the skin for 7-10 days.  If your surgeon used skin glue on the incision, you may shower in 24 hours.  The glue will flake off over the next 2-3 weeks.  Any  sutures or staples will be removed at the office during your follow-up visit. ACTIVITIES:  You may resume regular daily activities (gradually increasing) beginning the next day.  Wearing a good support bra or sports bra minimizes pain and swelling.  You may have sexual intercourse when it is comfortable. You may drive when you no longer are taking prescription pain medication, you can comfortably wear a seatbelt, and you can safely maneuver your car and apply brakes. RETURN TO WORK:  ______________________________________________________________________________________ Dennis Bast should see your doctor in the office for a follow-up appointment approximately two weeks after your surgery.  Your doctor's nurse will typically make your follow-up appointment when she calls you with your pathology report.  Expect your pathology report 2-3 business days after your surgery.  You may call to check if you do not hear from [place] after three days. OTHER INSTRUCTIONS: you may shower starting tomorrow Ice pack, tylenol, ibuprofen for pain No vigorous activity for one week _______________________________________________________________________________________________ _____________________________________________________________________________________________________________________________________ _____________________________________________________________________________________________________________________________________ _____________________________________________________________________________________________________________________________________  WHEN TO CALL YOUR DOCTOR: Fever over 101.0 Nausea and/or vomiting. Extreme swelling or bruising. Continued bleeding from incision. Increased pain, redness, or drainage from the incision.  The clinic staff is available to answer your questions during regular business hours.  Please don't hesitate to call and ask to speak to one of the nurses for clinical concerns.   If you have a medical emergency, go to the nearest emergency room or call 911.  A surgeon from [facility] is always on call at the hospital.  For further questions, please visit [email or web address]

## 2022-02-11 NOTE — Op Note (Signed)
RIGHT BREAST LUMPECTOMY WITH RADIOACTIVE SEED LOCALIZATION  Procedure Note  Rachel Perkins 02/11/2022   Pre-op Diagnosis: RIGHT BREAST MASS     Post-op Diagnosis: same  Procedure(s): RIGHT BREAST LUMPECTOMY WITH RADIOACTIVE SEED LOCALIZATION  Surgeon(s): Coralie Keens, MD  Anesthesia: General  Staff:  Circulator: Rometta Emery, RN Relief Scrub: Rolan Bucco Scrub Person: Verlene Mayer  Estimated Blood Loss: Minimal               Specimens: sent to path  Indications: This is a 71 year old female who was found earlier this year to have an abnormality in the right breast on screening mammography.  She underwent a biopsy showing inflammatory changes and fibrocystic changes.  On a 10-monthfollow-up mammogram this area is persisted.  It was felt to be discordant by the radiologist so removal of the area is recommended.  Procedure: The patient was brought to operating identifies correct patient.  She was placed upon on the operating room table and general anesthesia was induced.  Her right breast was prepped and draped in usual sterile fashion.  Using the neoprobe I located the radioactive seed at the 9 o'clock position of the breast more than 6 and meters from the nipple areolar complex.  I elected to anesthetize the skin over the top of the signal with Marcaine and then make an incision there with a 15 blade.  I then dissected down to the breast tissue with electrocautery.  With the aid of the neoprobe within stayed widely around the radioactive seed and completed the lumpectomy with the cautery.  Once the specimen was removed I marked all margins with paint.  An x-ray was performed confirming the radioactive seed and previous biopsy clip were in the specimen.  The specimen was then sent to pathology for evaluation.  I achieved hemostasis with the cautery.  I then closed the subcutaneous tissue with interrupted 3-0 Vicryl sutures and closed the skin with a running 4-0 Monocryl.   Dermabond was then applied.  Patient tolerated the procedure well.  All the counts were correct at the end of the procedure.  The patient was then extubated in the operating room and taken in stable condition to the recovery room. DCoralie Keens  Date: 02/11/2022  Time: 12:27 PM

## 2022-02-11 NOTE — Interval H&P Note (Signed)
History and Physical Interval Note: No change in H and P  02/11/2022 8:15 AM  Rachel Perkins  has presented today for surgery, with the diagnosis of RIGHT BREAST MASS.  The various methods of treatment have been discussed with the patient and family. After consideration of risks, benefits and other options for treatment, the patient has consented to  Procedure(s): RIGHT BREAST LUMPECTOMY WITH RADIOACTIVE SEED LOCALIZATION (Right) as a surgical intervention.  The patient's history has been reviewed, patient examined, no change in status, stable for surgery.  I have reviewed the patient's chart and labs.  Questions were answered to the patient's satisfaction.     Coralie Keens

## 2022-02-11 NOTE — Anesthesia Postprocedure Evaluation (Signed)
Anesthesia Post Note  Patient: Rachel Perkins [facility]  Procedure(s) Performed: RIGHT BREAST LUMPECTOMY WITH RADIOACTIVE SEED LOCALIZATION (Right: Breast)     Patient location during evaluation: PACU Anesthesia Type: General Level of consciousness: awake Pain management: pain level controlled Vital Signs Assessment: post-procedure vital signs reviewed and stable Respiratory status: spontaneous breathing Cardiovascular status: stable Postop Assessment: no apparent nausea or vomiting Anesthetic complications: no   No notable events documented.  Last Vitals:  Vitals:   02/11/22 1255 02/11/22 1310  BP: (!) 124/106 124/88  Pulse: (!) 53 (!) 52  Resp: 16 18  Temp:  36.7 C  SpO2: 95% 94%    Last Pain:  Vitals:   02/11/22 1310  TempSrc:   PainSc: 0-No pain                 Huston Foley

## 2022-02-12 ENCOUNTER — Encounter (HOSPITAL_COMMUNITY): Payer: Self-pay | Admitting: Surgery

## 2022-02-13 LAB — SURGICAL PATHOLOGY

## 2022-02-17 ENCOUNTER — Encounter (HOSPITAL_COMMUNITY): Payer: Self-pay

## 2022-03-02 DIAGNOSIS — E1165 Type 2 diabetes mellitus with hyperglycemia: Secondary | ICD-10-CM | POA: Diagnosis not present

## 2022-03-02 DIAGNOSIS — Z6825 Body mass index (BMI) 25.0-25.9, adult: Secondary | ICD-10-CM | POA: Diagnosis not present

## 2022-03-02 DIAGNOSIS — Z299 Encounter for prophylactic measures, unspecified: Secondary | ICD-10-CM | POA: Diagnosis not present

## 2022-03-02 DIAGNOSIS — Z8673 Personal history of transient ischemic attack (TIA), and cerebral infarction without residual deficits: Secondary | ICD-10-CM | POA: Diagnosis not present

## 2022-03-02 DIAGNOSIS — I10 Essential (primary) hypertension: Secondary | ICD-10-CM | POA: Diagnosis not present

## 2022-03-04 DIAGNOSIS — D126 Benign neoplasm of colon, unspecified: Secondary | ICD-10-CM | POA: Diagnosis not present

## 2022-03-18 DIAGNOSIS — Z299 Encounter for prophylactic measures, unspecified: Secondary | ICD-10-CM | POA: Diagnosis not present

## 2022-03-18 DIAGNOSIS — I10 Essential (primary) hypertension: Secondary | ICD-10-CM | POA: Diagnosis not present

## 2022-03-18 DIAGNOSIS — Z8673 Personal history of transient ischemic attack (TIA), and cerebral infarction without residual deficits: Secondary | ICD-10-CM | POA: Diagnosis not present

## 2022-03-18 DIAGNOSIS — G8194 Hemiplegia, unspecified affecting left nondominant side: Secondary | ICD-10-CM | POA: Diagnosis not present

## 2022-03-18 DIAGNOSIS — Z6825 Body mass index (BMI) 25.0-25.9, adult: Secondary | ICD-10-CM | POA: Diagnosis not present

## 2022-03-19 DIAGNOSIS — M5136 Other intervertebral disc degeneration, lumbar region: Secondary | ICD-10-CM | POA: Diagnosis not present

## 2022-04-20 DIAGNOSIS — G8194 Hemiplegia, unspecified affecting left nondominant side: Secondary | ICD-10-CM | POA: Diagnosis not present

## 2022-04-20 DIAGNOSIS — Z8673 Personal history of transient ischemic attack (TIA), and cerebral infarction without residual deficits: Secondary | ICD-10-CM | POA: Diagnosis not present

## 2022-04-20 DIAGNOSIS — I10 Essential (primary) hypertension: Secondary | ICD-10-CM | POA: Diagnosis not present

## 2022-04-20 DIAGNOSIS — Z299 Encounter for prophylactic measures, unspecified: Secondary | ICD-10-CM | POA: Diagnosis not present

## 2022-04-24 DIAGNOSIS — I358 Other nonrheumatic aortic valve disorders: Secondary | ICD-10-CM | POA: Diagnosis not present

## 2022-04-24 DIAGNOSIS — I10 Essential (primary) hypertension: Secondary | ICD-10-CM | POA: Diagnosis not present

## 2022-04-24 DIAGNOSIS — I2510 Atherosclerotic heart disease of native coronary artery without angina pectoris: Secondary | ICD-10-CM | POA: Diagnosis not present

## 2022-05-13 DIAGNOSIS — N183 Chronic kidney disease, stage 3 unspecified: Secondary | ICD-10-CM | POA: Diagnosis not present

## 2022-05-13 DIAGNOSIS — I10 Essential (primary) hypertension: Secondary | ICD-10-CM | POA: Diagnosis not present

## 2022-05-13 DIAGNOSIS — F339 Major depressive disorder, recurrent, unspecified: Secondary | ICD-10-CM | POA: Diagnosis not present

## 2022-05-13 DIAGNOSIS — Z299 Encounter for prophylactic measures, unspecified: Secondary | ICD-10-CM | POA: Diagnosis not present

## 2022-05-13 DIAGNOSIS — Z8673 Personal history of transient ischemic attack (TIA), and cerebral infarction without residual deficits: Secondary | ICD-10-CM | POA: Diagnosis not present

## 2022-05-14 DIAGNOSIS — M4726 Other spondylosis with radiculopathy, lumbar region: Secondary | ICD-10-CM | POA: Diagnosis not present

## 2022-05-14 DIAGNOSIS — M48061 Spinal stenosis, lumbar region without neurogenic claudication: Secondary | ICD-10-CM | POA: Diagnosis not present

## 2022-05-14 DIAGNOSIS — M5136 Other intervertebral disc degeneration, lumbar region: Secondary | ICD-10-CM | POA: Diagnosis not present

## 2022-05-25 ENCOUNTER — Emergency Department (HOSPITAL_COMMUNITY): Payer: Medicare Other

## 2022-05-25 ENCOUNTER — Encounter (HOSPITAL_COMMUNITY): Payer: Self-pay | Admitting: Radiology

## 2022-05-25 ENCOUNTER — Observation Stay (HOSPITAL_COMMUNITY)
Admission: EM | Admit: 2022-05-25 | Discharge: 2022-05-27 | Disposition: A | Discharge status: Home Health | Payer: Medicare Other | Attending: Family Medicine | Admitting: Family Medicine

## 2022-05-25 DIAGNOSIS — E1165 Type 2 diabetes mellitus with hyperglycemia: Secondary | ICD-10-CM | POA: Insufficient documentation

## 2022-05-25 DIAGNOSIS — Z1152 Encounter for screening for COVID-19: Secondary | ICD-10-CM | POA: Diagnosis not present

## 2022-05-25 DIAGNOSIS — R2689 Other abnormalities of gait and mobility: Secondary | ICD-10-CM | POA: Insufficient documentation

## 2022-05-25 DIAGNOSIS — F17210 Nicotine dependence, cigarettes, uncomplicated: Secondary | ICD-10-CM | POA: Insufficient documentation

## 2022-05-25 DIAGNOSIS — R1110 Vomiting, unspecified: Secondary | ICD-10-CM | POA: Insufficient documentation

## 2022-05-25 DIAGNOSIS — I480 Paroxysmal atrial fibrillation: Secondary | ICD-10-CM | POA: Diagnosis not present

## 2022-05-25 DIAGNOSIS — Z955 Presence of coronary angioplasty implant and graft: Secondary | ICD-10-CM | POA: Insufficient documentation

## 2022-05-25 DIAGNOSIS — I10 Essential (primary) hypertension: Secondary | ICD-10-CM | POA: Diagnosis not present

## 2022-05-25 DIAGNOSIS — Z7982 Long term (current) use of aspirin: Secondary | ICD-10-CM | POA: Diagnosis not present

## 2022-05-25 DIAGNOSIS — E782 Mixed hyperlipidemia: Secondary | ICD-10-CM | POA: Diagnosis not present

## 2022-05-25 DIAGNOSIS — Z79899 Other long term (current) drug therapy: Secondary | ICD-10-CM | POA: Diagnosis not present

## 2022-05-25 DIAGNOSIS — I6523 Occlusion and stenosis of bilateral carotid arteries: Secondary | ICD-10-CM | POA: Diagnosis not present

## 2022-05-25 DIAGNOSIS — R2681 Unsteadiness on feet: Secondary | ICD-10-CM | POA: Diagnosis not present

## 2022-05-25 DIAGNOSIS — I160 Hypertensive urgency: Secondary | ICD-10-CM | POA: Insufficient documentation

## 2022-05-25 DIAGNOSIS — K5730 Diverticulosis of large intestine without perforation or abscess without bleeding: Secondary | ICD-10-CM | POA: Diagnosis not present

## 2022-05-25 DIAGNOSIS — R4182 Altered mental status, unspecified: Principal | ICD-10-CM | POA: Diagnosis present

## 2022-05-25 DIAGNOSIS — Z8673 Personal history of transient ischemic attack (TIA), and cerebral infarction without residual deficits: Secondary | ICD-10-CM

## 2022-05-25 DIAGNOSIS — I2510 Atherosclerotic heart disease of native coronary artery without angina pectoris: Secondary | ICD-10-CM | POA: Diagnosis not present

## 2022-05-25 DIAGNOSIS — Z7984 Long term (current) use of oral hypoglycemic drugs: Secondary | ICD-10-CM | POA: Diagnosis not present

## 2022-05-25 DIAGNOSIS — G459 Transient cerebral ischemic attack, unspecified: Secondary | ICD-10-CM

## 2022-05-25 DIAGNOSIS — Z7901 Long term (current) use of anticoagulants: Secondary | ICD-10-CM | POA: Insufficient documentation

## 2022-05-25 DIAGNOSIS — R400 Somnolence: Secondary | ICD-10-CM | POA: Diagnosis not present

## 2022-05-25 LAB — CBC
HCT: 49.9 % — ABNORMAL HIGH (ref 36.0–46.0)
Hemoglobin: 16.8 g/dL — ABNORMAL HIGH (ref 12.0–15.0)
MCH: 31.5 pg (ref 26.0–34.0)
MCHC: 33.7 g/dL (ref 30.0–36.0)
MCV: 93.6 fL (ref 80.0–100.0)
Platelets: 302 10*3/uL (ref 150–400)
RBC: 5.33 MIL/uL — ABNORMAL HIGH (ref 3.87–5.11)
RDW: 14.1 % (ref 11.5–15.5)
WBC: 12.6 10*3/uL — ABNORMAL HIGH (ref 4.0–10.5)
nRBC: 0 % (ref 0.0–0.2)

## 2022-05-25 LAB — URINALYSIS, ROUTINE W REFLEX MICROSCOPIC
Bilirubin Urine: NEGATIVE
Glucose, UA: NEGATIVE mg/dL
Hgb urine dipstick: NEGATIVE
Ketones, ur: NEGATIVE mg/dL
Leukocytes,Ua: NEGATIVE
Nitrite: NEGATIVE
Protein, ur: 100 mg/dL — AB
Specific Gravity, Urine: 1.016 (ref 1.005–1.030)
pH: 5 (ref 5.0–8.0)

## 2022-05-25 LAB — DIFFERENTIAL
Abs Immature Granulocytes: 0.04 10*3/uL (ref 0.00–0.07)
Basophils Absolute: 0.1 10*3/uL (ref 0.0–0.1)
Basophils Relative: 1 %
Eosinophils Absolute: 0.1 10*3/uL (ref 0.0–0.5)
Eosinophils Relative: 1 %
Immature Granulocytes: 0 %
Lymphocytes Relative: 18 %
Lymphs Abs: 2.3 10*3/uL (ref 0.7–4.0)
Monocytes Absolute: 0.8 10*3/uL (ref 0.1–1.0)
Monocytes Relative: 6 %
Neutro Abs: 9.4 10*3/uL — ABNORMAL HIGH (ref 1.7–7.7)
Neutrophils Relative %: 74 %

## 2022-05-25 LAB — COMPREHENSIVE METABOLIC PANEL
ALT: 27 U/L (ref 0–44)
AST: 22 U/L (ref 15–41)
Albumin: 4.7 g/dL (ref 3.5–5.0)
Alkaline Phosphatase: 95 U/L (ref 38–126)
Anion gap: 12 (ref 5–15)
BUN: 15 mg/dL (ref 8–23)
CO2: 23 mmol/L (ref 22–32)
Calcium: 9.9 mg/dL (ref 8.9–10.3)
Chloride: 100 mmol/L (ref 98–111)
Creatinine, Ser: 1.13 mg/dL — ABNORMAL HIGH (ref 0.44–1.00)
GFR, Estimated: 51 mL/min — ABNORMAL LOW (ref >60)
Glucose, Bld: 152 mg/dL — ABNORMAL HIGH (ref 70–99)
Potassium: 4.3 mmol/L (ref 3.5–5.1)
Sodium: 135 mmol/L (ref 135–145)
Total Bilirubin: 0.9 mg/dL (ref 0.3–1.2)
Total Protein: 8.2 g/dL — ABNORMAL HIGH (ref 6.5–8.1)

## 2022-05-25 LAB — RAPID URINE DRUG SCREEN, HOSP PERFORMED
Amphetamines: NOT DETECTED
Barbiturates: NOT DETECTED
Benzodiazepines: NOT DETECTED
Cocaine: NOT DETECTED
Opiates: NOT DETECTED
Tetrahydrocannabinol: NOT DETECTED

## 2022-05-25 LAB — PROTIME-INR
INR: 1.5 — ABNORMAL HIGH (ref 0.8–1.2)
Prothrombin Time: 17.7 seconds — ABNORMAL HIGH (ref 11.4–15.2)

## 2022-05-25 LAB — APTT: aPTT: 30 seconds (ref 24–36)

## 2022-05-25 LAB — RESP PANEL BY RT-PCR (RSV, FLU A&B, COVID)  RVPGX2
Influenza A by PCR: NEGATIVE
Influenza B by PCR: NEGATIVE
Resp Syncytial Virus by PCR: NEGATIVE
SARS Coronavirus 2 by RT PCR: NEGATIVE

## 2022-05-25 LAB — CBG MONITORING, ED: Glucose-Capillary: 141 mg/dL — ABNORMAL HIGH (ref 70–99)

## 2022-05-25 LAB — ETHANOL: Alcohol, Ethyl (B): <10 mg/dL (ref <10)

## 2022-05-25 MED ORDER — SODIUM CHLORIDE 0.9 % IV BOLUS
1000.0000 mL | Freq: Once | INTRAVENOUS | Status: AC
  Administered 2022-05-25: 1000 mL via INTRAVENOUS

## 2022-05-25 MED ORDER — ONDANSETRON HCL 4 MG/2ML IJ SOLN
4.0000 mg | Freq: Once | INTRAMUSCULAR | Status: AC
  Administered 2022-05-25: 4 mg via INTRAVENOUS
  Filled 2022-05-25: qty 2

## 2022-05-25 MED ORDER — IOHEXOL 300 MG/ML  SOLN
75.0000 mL | Freq: Once | INTRAMUSCULAR | Status: AC | PRN
  Administered 2022-05-25: 75 mL via INTRAVENOUS

## 2022-05-25 NOTE — ED Triage Notes (Signed)
Pt arrived to triage with AMS. Spouse says at 2 Pm today he noticed that she was confused. She was asking him strange questions and not able to hold things or follow commands.  Pt assisted out of car by staff, covered in vomit.  Pt having a hard time following commands and being repetitive. Pt oriented to self but not time or situation  Pt able to move all extremities  Dr. Regenia Skeeter in triage assessing pt Hx of stroke and taking warfarin.

## 2022-05-25 NOTE — ED Provider Notes (Signed)
[facility] EMERGENCY DEPARTMENT Provider Note   CSN: [id number] Arrival date & time: 05/25/22  2015     History  Chief Complaint  Patient presents with   Altered Mental Status    Rachel Perkins is a 72 y.o. female.  HPI 72 year old female presents with altered mental status and slurred speech.  History is from the husband who is at the bedside in triage.  Started around 2 PM.  He last saw her normal just before that.  She is having trouble speaking and slurring her words.  He feels like she had some weakness and was dropping things but is not sure out of which hand or which side.  Has previous left-sided weakness from a stroke.  She is on warfarin and took a dose tonight but then threw up.  Home Medications Prior to Admission medications   Medication Sig Start Date End Date Taking? Authorizing Provider  acetaminophen (TYLENOL) 325 MG tablet Take 1-2 tablets (325-650 mg total) by mouth every 4 (four) hours as needed for mild pain. 05/19/18   Love, Ivan Anchors, PA-C  aspirin EC 81 MG tablet Take 81 mg by mouth daily.    [provider]  atorvastatin (LIPITOR) 80 MG tablet Take 80 mg by mouth daily.    [provider]  azelastine (ASTELIN) 0.1 % nasal spray Place 2 sprays into both nostrils 2 (two) times daily as needed for rhinitis or allergies. Use in each nostril as directed    [provider]  calcium-vitamin D (OSCAL WITH D) 500-200 MG-UNIT tablet Take 1 tablet by mouth daily with breakfast. 05/20/18   Love, Ivan Anchors, PA-C  cholecalciferol (VITAMIN D3) 25 MCG (1000 UNIT) tablet Take 1,000 Units by mouth daily.    [provider]  cyclobenzaprine (FLEXERIL) 10 MG tablet Take 10 mg by mouth 3 (three) times daily as needed for muscle spasms. 05/20/21   [provider]  escitalopram (LEXAPRO) 10 MG tablet Take 10 mg by mouth daily. 04/16/21   [provider]  gabapentin (NEURONTIN) 300 MG capsule Take 300 mg by mouth 3 (three) times daily.  12/08/21   [provider]  metFORMIN (GLUCOPHAGE) 500 MG tablet Take 500 mg by mouth 2 (two) times daily. 12/09/21   [provider]  metoprolol tartrate (LOPRESSOR) 25 MG tablet Take 1 tablet (25 mg total) by mouth 2 (two) times daily. 05/27/18   Love, Ivan Anchors, PA-C  nitroGLYCERIN (NITROSTAT) 0.4 MG SL tablet PLACE 1 TABLET UNDER THE TONGUE EVERY 5 MINUTES FOR 3 DOSES AS NEEDED CHEST PAIN 12/13/17   Herminio Commons, MD  oxyCODONE-acetaminophen (PERCOCET/ROXICET) 5-325 MG tablet Take 1 tablet by mouth every 8 (eight) hours as needed for severe pain. 05/14/21   [provider]  warfarin (COUMADIN) 5 MG tablet Take 1 tablet (5 mg total) by mouth daily at 6 PM. 05/27/18   Love, Ivan Anchors, PA-C      Allergies    Codeine, Other, Penicillins, Sulfa antibiotics, Lisinopril, and Lorazepam    Review of Systems   Review of Systems  Unable to perform ROS: Mental status change    Physical Exam Updated Vital Signs BP (!) 190/94   Pulse 75   Temp 98.9 F (37.2 C) (Rectal)   Resp 14   SpO2 100%  Physical Exam Vitals and nursing note reviewed.  Constitutional:      Appearance: She is well-developed.  HENT:     Head: Normocephalic and atraumatic.  Cardiovascular:     Rate  and Rhythm: Normal rate and regular rhythm.     Heart sounds: Normal heart sounds.  Pulmonary:     Effort: Pulmonary effort is normal.     Breath sounds: Normal breath sounds.  Abdominal:     Palpations: Abdomen is soft.     Tenderness: There is no abdominal tenderness.  Skin:    General: Skin is warm and dry.  Neurological:     Mental Status: She is alert.     Comments: Seems generally confused. I don't appreciate a facial droop or slurred speech. She is able to squeeze my hands with equal strength. Seems to have a hard time following some commands. No appreciable unilateral weakness in arms or legs.     ED Results / Procedures / Treatments   Labs (all labs ordered are listed, but only  abnormal results are displayed) Labs Reviewed  PROTIME-INR - Abnormal; Notable for the following components:      Result Value   Prothrombin Time 17.7 (*)    INR 1.5 (*)    All other components within normal limits  CBC - Abnormal; Notable for the following components:   WBC 12.6 (*)    RBC 5.33 (*)    Hemoglobin 16.8 (*)    HCT 49.9 (*)    All other components within normal limits  DIFFERENTIAL - Abnormal; Notable for the following components:   Neutro Abs 9.4 (*)    All other components within normal limits  COMPREHENSIVE METABOLIC PANEL - Abnormal; Notable for the following components:   Glucose, Bld 152 (*)    Creatinine, Ser 1.13 (*)    Total Protein 8.2 (*)    GFR, Estimated 51 (*)    All other components within normal limits  URINALYSIS, ROUTINE W REFLEX MICROSCOPIC - Abnormal; Notable for the following components:   Protein, ur 100 (*)    Bacteria, UA RARE (*)    All other components within normal limits  CBG MONITORING, ED - Abnormal; Notable for the following components:   Glucose-Capillary 141 (*)    All other components within normal limits  RESP PANEL BY RT-PCR (RSV, FLU A&B, COVID)  RVPGX2  URINE CULTURE  ETHANOL  APTT  RAPID URINE DRUG SCREEN, HOSP PERFORMED    EKG EKG Interpretation  Date/Time:  Monday May 25 2022 20:21:26 EST Ventricular Rate:  61 PR Interval:  180 QRS Duration: 82 QT Interval:  424 QTC Calculation: 426 R Axis:   -35 Text Interpretation: Normal sinus rhythm Left axis deviation Interpretation limited secondary to artifact otherwise appears similar to Sept 2023 Confirmed by Sherwood Gambler [phone]) on 05/25/2022 8:39:25 PM  Radiology DG Chest Portable 1 View  Result Date: 05/25/2022 CLINICAL DATA:  Altered mental status EXAM: PORTABLE CHEST 1 VIEW COMPARISON:  None Available. FINDINGS: The heart size and mediastinal contours are within normal limits. Both lungs are clear. The visualized skeletal structures are unremarkable.  IMPRESSION: No active disease. Electronically Signed   By: Ulyses Jarred M.D.   On: 05/25/2022 23:07   CT ABDOMEN PELVIS W CONTRAST  Result Date: 05/25/2022 CLINICAL DATA:  Bowel obstruction suspected.  Vomiting. EXAM: CT ABDOMEN AND PELVIS WITH CONTRAST TECHNIQUE: Multidetector CT imaging of the abdomen and pelvis was performed using the standard protocol following bolus administration of intravenous contrast. RADIATION DOSE REDUCTION: This exam was performed according to the departmental dose-optimization program which includes automated exposure control, adjustment of the mA and/or kV according to patient size and/or use of iterative reconstruction technique. CONTRAST:  3m OMNIPAQUE  IOHEXOL 300 MG/ML  SOLN COMPARISON:  None Available. FINDINGS: Lower chest: No acute findings Hepatobiliary: No focal hepatic abnormality. Gallbladder unremarkable. Pancreas: No focal abnormality or ductal dilatation. Spleen: No focal abnormality.  Normal size. Adrenals/Urinary Tract: Small bilateral adrenal nodules, 12 mm on the right in 10 mm on the left. No renal stones or hydronephrosis. Urinary bladder unremarkable. Stomach/Bowel: Sigmoid diverticulosis. No active diverticulitis. Stomach and small bowel decompressed, unremarkable. Vascular/Lymphatic: Aortic atherosclerosis. No evidence of aneurysm or adenopathy. Reproductive: Prior hysterectomy.  No adnexal masses. Other: No free fluid or free air. Musculoskeletal: No acute bony abnormality. IMPRESSION: No acute findings in the abdomen or pelvis. Sigmoid diverticulosis. Bilateral adrenal nodules, likely adenomas. Aortic atherosclerosis. Electronically Signed   By: Rolm Baptise M.D.   On: 05/25/2022 22:54   CT HEAD WO CONTRAST  Result Date: 05/25/2022 CLINICAL DATA:  Altered level of consciousness, neurologic deficit EXAM: CT HEAD WITHOUT CONTRAST TECHNIQUE: Contiguous axial images were obtained from the base of the skull through the vertex without intravenous contrast.  RADIATION DOSE REDUCTION: This exam was performed according to the departmental dose-optimization program which includes automated exposure control, adjustment of the mA and/or kV according to patient size and/or use of iterative reconstruction technique. COMPARISON:  05/11/2018 report only, images unavailable FINDINGS: Brain: Encephalomalacia is seen within the right MCA territory and within a small region of the left parietal cortex, consistent with chronic infarcts. Hypodensities throughout the periventricular and subcortical white matter are most consistent with chronic small vessel ischemic change. No evidence of acute infarct or hemorrhage. Lateral ventricles and midline structures are unremarkable. No acute extra-axial fluid collections. No mass effect. Vascular: Atherosclerosis of the internal carotid arteries. No hyperdense vessel. Skull: Normal. Negative for fracture or focal lesion. Sinuses/Orbits: No acute finding. Other: None. IMPRESSION: 1. Chronic ischemic changes as above. No acute intracranial process. Electronically Signed   By: Randa Ngo M.D.   On: 05/25/2022 21:00    Procedures Procedures    Medications Ordered in ED Medications  ondansetron [facility]) injection 4 mg (4 mg Intravenous Given 05/25/22 2119)  sodium chloride 0.9 % bolus 1,000 mL (1,000 mLs Intravenous New Bag/Given 05/25/22 2135)  iohexol (OMNIPAQUE) 300 MG/ML solution 75 mL (75 mLs Intravenous Contrast Given 05/25/22 2236)    ED Course/ Medical Decision Making/ A&P Clinical Course as of 05/25/22 2312  Mon May 25, 2022  2025 I was asked to assess patient in triage for concern for possible stroke.  At the time I am seeing her, husband notes that her last known well was 2 PM.  He has noticed slurred speech and trouble speaking.  He feels like she was weak but is not sure which side.  She has previous stroke affecting the left side.  She is on warfarin.  She did take her warfarin tonight at 6 PM but then threw up.  I do  not find any obvious lateralizing symptoms I do not think a code stroke is warranted as she is outside of 4 and half hours but will get urgent head CT. [SG]    Clinical Course User Index [SG] Sherwood Gambler, MD                             Medical Decision Making Amount and/or Complexity of Data Reviewed External Data Reviewed: notes. Labs: ordered.    Details: Nonspecific leukocytosis. UA without UTI. Radiology: ordered and independent interpretation performed.    Details: CT head - no head  bleed CT abdomen/pelvis-no SBO. ECG/medicine tests: ordered and independent interpretation performed.    Details: NSR, no acute ischemia  Risk Prescription drug management. Decision regarding hospitalization.   Patient presents with altered mental status.  No focal neurodeficits on my exam, as above.  CT head without head bleed.  Otherwise the workup shows no significant findings besides a mild leukocytosis.  She was given some fluids.  Urinalysis without UTI.  At this point there is no obvious infection.  Her blood pressure was quite elevated, 765+ systolic on arrival, though when she got to the treatment room her blood pressure had come down some to the 465-035 systolic range.  It is now crept up to 190 though she otherwise just appears generally confused without focal findings.  She did have some vomiting which led to a CT of the abdomen which is unremarkable.  Right now she is resting comfortably.  She will need admission for further workup and likely MRI.  Discussed with hospitalist, Dr. Josephine Cables. After discussion, he asks for neurology's opinion to determine if she can stay up here or needs more urgent MRI. Discussed with Dr. Lorrin Goodell, advises CTA head/neck. If no occlusion, can safely lower BP per him to around 180. Will order CTA head/neck. Will need to re-consult Dr. Josephine Cables after these results for admission.        Final Clinical Impression(s) / ED Diagnoses Final diagnoses:  Altered  mental status, unspecified altered mental status type    Rx / DC Orders ED Discharge Orders     None         Sherwood Gambler, MD 05/25/22 2332

## 2022-05-26 ENCOUNTER — Emergency Department (HOSPITAL_COMMUNITY): Payer: Medicare Other

## 2022-05-26 ENCOUNTER — Other Ambulatory Visit: Payer: Self-pay

## 2022-05-26 ENCOUNTER — Observation Stay (HOSPITAL_BASED_OUTPATIENT_CLINIC_OR_DEPARTMENT_OTHER): Payer: Medicare Other

## 2022-05-26 DIAGNOSIS — R410 Disorientation, unspecified: Secondary | ICD-10-CM | POA: Diagnosis not present

## 2022-05-26 DIAGNOSIS — I2510 Atherosclerotic heart disease of native coronary artery without angina pectoris: Secondary | ICD-10-CM | POA: Diagnosis not present

## 2022-05-26 DIAGNOSIS — R4182 Altered mental status, unspecified: Secondary | ICD-10-CM | POA: Diagnosis not present

## 2022-05-26 DIAGNOSIS — E782 Mixed hyperlipidemia: Secondary | ICD-10-CM

## 2022-05-26 DIAGNOSIS — I10 Essential (primary) hypertension: Secondary | ICD-10-CM

## 2022-05-26 DIAGNOSIS — E1165 Type 2 diabetes mellitus with hyperglycemia: Secondary | ICD-10-CM | POA: Diagnosis not present

## 2022-05-26 DIAGNOSIS — Z8673 Personal history of transient ischemic attack (TIA), and cerebral infarction without residual deficits: Secondary | ICD-10-CM | POA: Diagnosis not present

## 2022-05-26 DIAGNOSIS — I160 Hypertensive urgency: Secondary | ICD-10-CM

## 2022-05-26 DIAGNOSIS — I6523 Occlusion and stenosis of bilateral carotid arteries: Secondary | ICD-10-CM | POA: Diagnosis not present

## 2022-05-26 DIAGNOSIS — G459 Transient cerebral ischemic attack, unspecified: Secondary | ICD-10-CM

## 2022-05-26 LAB — ECHOCARDIOGRAM COMPLETE
AR max vel: 2.74 cm2
AV Area VTI: 2.48 cm2
AV Area mean vel: 2.53 cm2
AV Mean grad: 3 mmHg
AV Peak grad: 5.6 mmHg
Ao pk vel: 1.18 m/s
Area-P 1/2: 2.38 cm2
MV VTI: 2.39 cm2
S' Lateral: 2.4 cm

## 2022-05-26 LAB — CBG MONITORING, ED
Glucose-Capillary: 158 mg/dL — ABNORMAL HIGH (ref 70–99)
Glucose-Capillary: 115 mg/dL — ABNORMAL HIGH (ref 70–99)

## 2022-05-26 LAB — GLUCOSE, CAPILLARY
Glucose-Capillary: 110 mg/dL — ABNORMAL HIGH (ref 70–99)
Glucose-Capillary: 121 mg/dL — ABNORMAL HIGH (ref 70–99)
Glucose-Capillary: 134 mg/dL — ABNORMAL HIGH (ref 70–99)
Glucose-Capillary: 146 mg/dL — ABNORMAL HIGH (ref 70–99)

## 2022-05-26 LAB — LIPID PANEL
Cholesterol: 105 mg/dL (ref 0–200)
HDL: 45 mg/dL (ref >40)
LDL Cholesterol: 48 mg/dL (ref 0–99)
Total CHOL/HDL Ratio: 2.3 RATIO
Triglycerides: 58 mg/dL (ref <150)
VLDL: 12 mg/dL (ref 0–40)

## 2022-05-26 LAB — HEMOGLOBIN A1C
Hgb A1c MFr Bld: 6.9 % — ABNORMAL HIGH (ref 4.8–5.6)
Mean Plasma Glucose: 151.33 mg/dL

## 2022-05-26 MED ORDER — METOPROLOL TARTRATE 25 MG PO TABS
25.0000 mg | ORAL_TABLET | Freq: Two times a day (BID) | ORAL | Status: DC
  Administered 2022-05-26 – 2022-05-27 (×3): 25 mg via ORAL
  Filled 2022-05-26 (×3): qty 1

## 2022-05-26 MED ORDER — INSULIN ASPART 100 UNIT/ML IJ SOLN
0.0000 [IU] | INTRAMUSCULAR | Status: DC
  Administered 2022-05-26: 2 [IU] via SUBCUTANEOUS
  Administered 2022-05-26 (×3): 1 [IU] via SUBCUTANEOUS
  Filled 2022-05-26: qty 1

## 2022-05-26 MED ORDER — WARFARIN SODIUM 5 MG PO TABS
5.0000 mg | ORAL_TABLET | Freq: Once | ORAL | Status: AC
  Administered 2022-05-26: 5 mg via ORAL
  Filled 2022-05-26: qty 1

## 2022-05-26 MED ORDER — ASPIRIN 81 MG PO TBEC
81.0000 mg | DELAYED_RELEASE_TABLET | Freq: Every day | ORAL | Status: DC
  Administered 2022-05-26 – 2022-05-27 (×2): 81 mg via ORAL
  Filled 2022-05-26 (×2): qty 1

## 2022-05-26 MED ORDER — ESCITALOPRAM OXALATE 10 MG PO TABS
10.0000 mg | ORAL_TABLET | Freq: Every day | ORAL | Status: DC
  Administered 2022-05-26 – 2022-05-27 (×2): 10 mg via ORAL
  Filled 2022-05-26 (×2): qty 1

## 2022-05-26 MED ORDER — ATORVASTATIN CALCIUM 40 MG PO TABS
80.0000 mg | ORAL_TABLET | Freq: Every day | ORAL | Status: DC
  Administered 2022-05-26 – 2022-05-27 (×2): 80 mg via ORAL
  Filled 2022-05-26 (×2): qty 2

## 2022-05-26 MED ORDER — WARFARIN - PHARMACIST DOSING INPATIENT: Freq: Every day | Status: DC

## 2022-05-26 MED ORDER — IOHEXOL 350 MG/ML SOLN
75.0000 mL | Freq: Once | INTRAVENOUS | Status: AC | PRN
  Administered 2022-05-26: 75 mL via INTRAVENOUS

## 2022-05-26 MED ORDER — GABAPENTIN 300 MG PO CAPS
300.0000 mg | ORAL_CAPSULE | Freq: Three times a day (TID) | ORAL | Status: DC
  Administered 2022-05-26 – 2022-05-27 (×3): 300 mg via ORAL
  Filled 2022-05-26 (×3): qty 1

## 2022-05-26 NOTE — Progress Notes (Signed)
ANTICOAGULATION CONSULT NOTE - Initial Consult  Pharmacy Consult for Warfarin Indication: atrial fibrillation  Allergies  Allergen Reactions   Codeine Itching    Pt says she can take    Other Anaphylaxis    NUTS   LOBSTER - causes itching      Penicillins Rash    DID THE REACTION INVOLVE: Swelling of the face/tongue/throat, SOB, or low BP? Unknown Sudden or severe rash/hives, skin peeling, or the inside of the mouth or nose? Unknown Did it require medical treatment? Unknown When did it last happen?      unk If all above answers are "NO", may proceed with cephalosporin use.    Sulfa Antibiotics Rash   Lisinopril Cough   Lorazepam Other (See Comments)    Confusion  Hyper  Other Reaction(s): Confusion  Other reaction(s): Confusion  Hyper  Other reaction(s): Confusion Hyper    Hyper    Patient Measurements:    Vital Signs: Temp: 98 F (36.7 C) (01/16 0935) Temp Source: Oral (01/16 0935) BP: 133/72 (01/16 0935) Pulse Rate: 71 (01/16 0935)  Labs: Recent Labs    05/25/22 2026  HGB 16.8*  HCT 49.9*  PLT 302  APTT 30  LABPROT 17.7*  INR 1.5*  CREATININE 1.13*    CrCl cannot be calculated (Unknown ideal weight.).   Medical History: Past Medical History:  Diagnosis Date   Anginal pain ([place]) 09/25/2013   Arthritis    CAD (coronary artery disease)    a. 09/25/13 s/p DES to mLCx and DES to pRCA.   Chronic left-sided back pain    has had ESI/Dr. Francesco Runner   DM (diabetes mellitus) ([place])    HTN (hypertension)    Myocardial infarction ([place]) 09/2013   nstemi   Stroke ([place]) 05/11/2018   Tobacco abuse     Medications:  Medications Prior to Admission  Medication Sig Dispense Refill Last Dose   acetaminophen (TYLENOL) 325 MG tablet Take 1-2 tablets (325-650 mg total) by mouth every 4 (four) hours as needed for mild pain.   unk   atorvastatin (LIPITOR) 80 MG tablet Take 80 mg by mouth daily.   Past Week   azelastine (ASTELIN) 0.1 % nasal spray Place 2  sprays into both nostrils 2 (two) times daily as needed for rhinitis or allergies. Use in each nostril as directed   unk   calcium-vitamin D (OSCAL WITH D) 500-200 MG-UNIT tablet Take 1 tablet by mouth daily with breakfast.   05/25/2022   cholecalciferol (VITAMIN D3) 25 MCG (1000 UNIT) tablet Take 1,000 Units by mouth daily.   05/25/2022   cyclobenzaprine (FLEXERIL) 10 MG tablet Take 10 mg by mouth 3 (three) times daily as needed for muscle spasms.   unk   escitalopram (LEXAPRO) 10 MG tablet Take 10 mg by mouth daily.   05/25/2022   gabapentin (NEURONTIN) 300 MG capsule Take 300 mg by mouth 3 (three) times daily.   05/25/2022   metFORMIN (GLUCOPHAGE) 500 MG tablet Take 500 mg by mouth 2 (two) times daily.   05/25/2022   metoprolol tartrate (LOPRESSOR) 25 MG tablet Take 1 tablet (25 mg total) by mouth 2 (two) times daily. 60 tablet 0 05/25/2022 at am   nitroGLYCERIN (NITROSTAT) 0.4 MG SL tablet PLACE 1 TABLET UNDER THE TONGUE EVERY 5 MINUTES FOR 3 DOSES AS NEEDED CHEST PAIN 25 tablet 0 unk   warfarin (COUMADIN) 5 MG tablet Take 1 tablet (5 mg total) by mouth daily at 6 PM. 30 tablet 0 05/25/2022 at 1800  oxyCODONE-acetaminophen (PERCOCET/ROXICET) 5-325 MG tablet Take 1 tablet by mouth every 8 (eight) hours as needed for severe pain. (Patient not taking: Reported on 05/26/2022)   Not Taking    Assessment: 72 y.o. female with medical history significant of hypertension, hyperlipidemia, CAD s/p stent placement, T2DM, prior stroke with residual left-sided weakness who presents to the emergency department a due to altered mental status. Patient is on chronic anticoagulation for afib with warfarin. Pharmacy asked to dose.  INR 1.5, subtherapeutic  Home dose is '5mg'$  po daily  Goal of Therapy:  INR 2-3 Monitor platelets by anticoagulation protocol: Yes   Plan:  Warfarin '5mg'$  po x 1 today Daily PT-INR Monitor for S/S of bleeding  Isac Sarna, BS Pharm D, BCPS Clinical Pharmacist 05/26/2022,12:07 PM

## 2022-05-26 NOTE — Plan of Care (Signed)
  Problem: Acute Rehab PT Goals(only PT should resolve) Goal: Pt Will Go Supine/Side To Sit Outcome: Progressing Flowsheets (Taken 05/26/2022 1428) Pt will go Supine/Side to Sit: with modified independence Goal: Patient Will Transfer Sit To/From Stand Outcome: Progressing Flowsheets (Taken 05/26/2022 1428) Patient will transfer sit to/from stand: with modified independence Goal: Pt Will Transfer Bed To Chair/Chair To Bed Outcome: Progressing Flowsheets (Taken 05/26/2022 1428) Pt will Transfer Bed to Chair/Chair to Bed: with modified independence Goal: Pt Will Ambulate Outcome: Progressing Flowsheets (Taken 05/26/2022 1428) Pt will Ambulate:  100 feet  with modified independence  with supervision  with least restrictive assistive device   2:29 PM, 05/26/22 Lonell Grandchild, MPT Physical Therapist with [facility] [phone] office [phone] mobile phone

## 2022-05-26 NOTE — H&P (Signed)
History and Physical    Patient: Rachel Perkins UJW:[id number] DOB: 03-Jan-1951 DOA: 05/25/2022 DOS: the patient was seen and examined on 05/26/2022 PCP: Monico Blitz, MD  Patient coming from: Home  Chief Complaint:  Chief Complaint  Patient presents with   Altered Mental Status   HPI: Rachel Perkins is a 72 y.o. female with medical history significant of hypertension, hyperlipidemia, CAD s/p stent placement, T2DM, prior stroke with residual left-sided weakness who presents to the emergency department accompanied by husband due to altered mental status.  Patient was unable to provide history, history was obtained from ED PA and husband at bedside.  Per report, LKW was unknown, but patient's husband states that he called patient while outside of home around 2 PM and he had difficulty in being able to understand what she was saying on the phone, so he returned home, and at home, he states that patient was slurring her words and was still difficult to understand her.  Patient was also reported to on the right side, since she was dropping things and was unable to open a jar (new from baseline).  Patient took warfarin this evening and threw up.  Patient was taken to the ED for further evaluation and management.  ED Course:  In the emergency department, BP was elevated at 217/59, other vital signs are within normal range.  Workup in the ED showed leukocytosis and H/H of 16.8/49.9.  Alcohol level was less than 10, urine drug screen was normal, urinalysis was normal.  Influenza A, B, SARS coronavirus 2, RSV was negative Chest x-ray showed no active disease CT head without contrast showed no acute intracranial process CT angiography head and neck showed: 1. Age-indeterminate occlusion of the left vertebral artery V1 and proximal V2 segments. This is new since 05/11/18. 2. No intracranial arterial occlusion or hemodynamically significant stenosis. 3. Bilateral carotid bifurcation atherosclerosis without  stenosis or occlusion. CT abdomen and pelvis with contrast showed no acute findings in the abdomen or pelvis Patient was treated with IV hydration, Zofran was given.  Hospitalist was asked to admit patient for further evaluation and management.  Review of Systems: Review of systems as noted in the HPI. All other systems reviewed and are negative.   Past Medical History:  Diagnosis Date   Anginal pain ([place]) 09/25/2013   Arthritis    CAD (coronary artery disease)    a. 09/25/13 s/p DES to mLCx and DES to pRCA.   Chronic left-sided back pain    has had ESI/Dr. Francesco Runner   DM (diabetes mellitus) ([place])    HTN (hypertension)    Myocardial infarction ([place]) 09/2013   nstemi   Stroke ([place]) 05/11/2018   Tobacco abuse    Past Surgical History:  Procedure Laterality Date   ABDOMINAL HYSTERECTOMY     ANKLE SURGERY Left    APPENDECTOMY     BREAST BIOPSY Right 06/09/2021   BREAST LUMPECTOMY WITH RADIOACTIVE SEED LOCALIZATION Right 02/11/2022   Procedure: RIGHT BREAST LUMPECTOMY WITH RADIOACTIVE SEED LOCALIZATION;  Surgeon: Coralie Keens, MD;  Location: [place];  Service: General;  Laterality: Right;   CERVICAL SPINE SURGERY  2021   ACDF by Dr. Newman Pies   CORONARY ANGIOPLASTY  09/25/2013   des mid circumflex  des rca   LEFT HEART CATHETERIZATION WITH CORONARY ANGIOGRAM N/A 09/25/2013   Procedure: [place];  Surgeon: Burnell Blanks, MD;  Location: [facility] CATH LAB;  Service: Cardiovascular;  Laterality: N/A;   skin grafts right arm  Social History:  reports that she has been smoking cigarettes. She started smoking about 58 years ago. She has a 25.00 pack-year smoking history. She has never used smokeless tobacco. She reports that she does not drink alcohol and does not use drugs.   Allergies  Allergen Reactions   Codeine Itching    Pt says she can take    Other Anaphylaxis    NUTS   LOBSTER - causes itching      Penicillins  Rash    DID THE REACTION INVOLVE: Swelling of the face/tongue/throat, SOB, or low BP? Unknown Sudden or severe rash/hives, skin peeling, or the inside of the mouth or nose? Unknown Did it require medical treatment? Unknown When did it last happen?      unk If all above answers are "NO", may proceed with cephalosporin use.    Sulfa Antibiotics Rash   Lisinopril Cough   Lorazepam     Confusion Hyper    Family History  Problem Relation Age of Onset   Heart failure Father 54   Cirrhosis Mother    Alcohol abuse Mother    Diabetes Sister    Breast cancer Sister        twin sister   Uterine cancer Sister      Prior to Admission medications   Medication Sig Start Date End Date Taking? Authorizing Provider  acetaminophen (TYLENOL) 325 MG tablet Take 1-2 tablets (325-650 mg total) by mouth every 4 (four) hours as needed for mild pain. 05/19/18   Love, Ivan Anchors, PA-C  aspirin EC 81 MG tablet Take 81 mg by mouth daily.    [provider]  atorvastatin (LIPITOR) 80 MG tablet Take 80 mg by mouth daily.    [provider]  azelastine (ASTELIN) 0.1 % nasal spray Place 2 sprays into both nostrils 2 (two) times daily as needed for rhinitis or allergies. Use in each nostril as directed    [provider]  calcium-vitamin D (OSCAL WITH D) 500-200 MG-UNIT tablet Take 1 tablet by mouth daily with breakfast. 05/20/18   Love, Ivan Anchors, PA-C  cholecalciferol (VITAMIN D3) 25 MCG (1000 UNIT) tablet Take 1,000 Units by mouth daily.    [provider]  cyclobenzaprine (FLEXERIL) 10 MG tablet Take 10 mg by mouth 3 (three) times daily as needed for muscle spasms. 05/20/21   [provider]  escitalopram (LEXAPRO) 10 MG tablet Take 10 mg by mouth daily. 04/16/21   [provider]  gabapentin (NEURONTIN) 300 MG capsule Take 300 mg by mouth 3 (three) times daily. 12/08/21   [provider]  metFORMIN (GLUCOPHAGE) 500 MG tablet Take 500 mg by mouth 2 (two)  times daily. 12/09/21   [provider]  metoprolol tartrate (LOPRESSOR) 25 MG tablet Take 1 tablet (25 mg total) by mouth 2 (two) times daily. 05/27/18   Love, Ivan Anchors, PA-C  nitroGLYCERIN (NITROSTAT) 0.4 MG SL tablet PLACE 1 TABLET UNDER THE TONGUE EVERY 5 MINUTES FOR 3 DOSES AS NEEDED CHEST PAIN 12/13/17   Herminio Commons, MD  oxyCODONE-acetaminophen (PERCOCET/ROXICET) 5-325 MG tablet Take 1 tablet by mouth every 8 (eight) hours as needed for severe pain. 05/14/21   [provider]  warfarin (COUMADIN) 5 MG tablet Take 1 tablet (5 mg total) by mouth daily at 6 PM. 05/27/18   Love, Ivan Anchors, PA-C    Physical Exam: BP 130/78   Pulse 72   Temp 98.9 F (37.2 C) (Rectal)   Resp 14   SpO2  98%   General: 72 y.o. year-old female well developed well nourished in no acute distress.   HEENT: NCAT, EOMI Neck: Supple, trachea medial Cardiovascular: Regular rate and rhythm with no rubs or gallops.  No thyromegaly or JVD noted.  No lower extremity edema. 2/4 pulses in all 4 extremities. Respiratory: Clear to auscultation with no wheezes or rales. Good inspiratory effort. Abdomen: Soft, nontender nondistended with normal bowel sounds x4 quadrants. Muskuloskeletal: No cyanosis, clubbing or edema noted bilaterally Neuro: Confused, unable to follow some commands.  No slurred speech on exam.   Skin: No ulcerative lesions noted or rashes Psychiatry: Mood is appropriate for condition and setting          Labs on Admission:  Basic Metabolic Panel: Recent Labs  Lab 05/25/22 2026  NA 135  K 4.3  CL 100  CO2 23  GLUCOSE 152*  BUN 15  CREATININE 1.13*  CALCIUM 9.9   Liver Function Tests: Recent Labs  Lab 05/25/22 2026  AST 22  ALT 27  ALKPHOS 95  BILITOT 0.9  PROT 8.2*  ALBUMIN 4.7   No results for input(s): "LIPASE", "AMYLASE" in the last 168 hours. No results for input(s): "AMMONIA" in the last 168 hours. CBC: Recent Labs  Lab 05/25/22 2026  WBC 12.6*  NEUTROABS  9.4*  HGB 16.8*  HCT 49.9*  MCV 93.6  PLT 302   Cardiac Enzymes: No results for input(s): "CKTOTAL", "CKMB", "CKMBINDEX", "TROPONINI" in the last 168 hours.  BNP (last 3 results) No results for input(s): "BNP" in the last 8760 hours.  ProBNP (last 3 results) No results for input(s): "PROBNP" in the last 8760 hours.  CBG: Recent Labs  Lab 05/25/22 2019  GLUCAP 141*    Radiological Exams on Admission: CT ANGIO HEAD NECK W WO CM  Result Date: 05/26/2022 CLINICAL DATA:  Stroke/TIA.  Altered mental status. EXAM: CT ANGIOGRAPHY HEAD AND NECK TECHNIQUE: Multidetector CT imaging of the head and neck was performed using the standard protocol during bolus administration of intravenous contrast. Multiplanar CT image reconstructions and MIPs were obtained to evaluate the vascular anatomy. Carotid stenosis measurements (when applicable) are obtained utilizing NASCET criteria, using the distal internal carotid diameter as the denominator. RADIATION DOSE REDUCTION: This exam was performed according to the departmental dose-optimization program which includes automated exposure control, adjustment of the mA and/or kV according to patient size and/or use of iterative reconstruction technique. CONTRAST:  53m OMNIPAQUE IOHEXOL 350 MG/ML SOLN COMPARISON:  05/11/2018 CTA HEAD NECK report (images not available) FINDINGS: CTA NECK FINDINGS Aortic arch: Normal aortic arch caliber. There is an independent origin of the left vertebral artery. Right carotid system: Minimal atherosclerotic calcification at the bifurcation without stenosis or occlusion. Left carotid system: Moderate atherosclerotic calcification at the bifurcation and proximal ICA without stenosis or occlusion. Vertebral arteries: Right dominant configuration. The left vertebral artery V1 and proximal V2 segments are occluded. The right vertebral artery is normal to the skull base. Skeleton: C5-7 ACDF.  No acute abnormality. Other neck: Negative. Upper  chest: Negative. Review of the MIP images confirms the above findings CTA HEAD FINDINGS Anterior circulation: There is atherosclerotic calcification of the internal carotid arteries at the skull base without hemodynamically significant stenosis. Normal anterior and middle cerebral arteries. Posterior circulation: No significant stenosis, proximal occlusion, aneurysm, or vascular malformation. Venous sinuses: As permitted by contrast timing, patent. Anatomic variants: None Review of the MIP images confirms the above findings IMPRESSION: 1. Age-indeterminate occlusion of the left vertebral artery V1 and proximal  V2 segments. This is new since 05/11/18. 2. No intracranial arterial occlusion or hemodynamically significant stenosis. 3. Bilateral carotid bifurcation atherosclerosis without stenosis or occlusion. Electronically Signed   By: Ulyses Jarred M.D.   On: 05/26/2022 01:32   DG Chest Portable 1 View  Result Date: 05/25/2022 CLINICAL DATA:  Altered mental status EXAM: PORTABLE CHEST 1 VIEW COMPARISON:  None Available. FINDINGS: The heart size and mediastinal contours are within normal limits. Both lungs are clear. The visualized skeletal structures are unremarkable. IMPRESSION: No active disease. Electronically Signed   By: Ulyses Jarred M.D.   On: 05/25/2022 23:07   CT ABDOMEN PELVIS W CONTRAST  Result Date: 05/25/2022 CLINICAL DATA:  Bowel obstruction suspected.  Vomiting. EXAM: CT ABDOMEN AND PELVIS WITH CONTRAST TECHNIQUE: Multidetector CT imaging of the abdomen and pelvis was performed using the standard protocol following bolus administration of intravenous contrast. RADIATION DOSE REDUCTION: This exam was performed according to the departmental dose-optimization program which includes automated exposure control, adjustment of the mA and/or kV according to patient size and/or use of iterative reconstruction technique. CONTRAST:  56m OMNIPAQUE IOHEXOL 300 MG/ML  SOLN COMPARISON:  None Available.  FINDINGS: Lower chest: No acute findings Hepatobiliary: No focal hepatic abnormality. Gallbladder unremarkable. Pancreas: No focal abnormality or ductal dilatation. Spleen: No focal abnormality.  Normal size. Adrenals/Urinary Tract: Small bilateral adrenal nodules, 12 mm on the right in 10 mm on the left. No renal stones or hydronephrosis. Urinary bladder unremarkable. Stomach/Bowel: Sigmoid diverticulosis. No active diverticulitis. Stomach and small bowel decompressed, unremarkable. Vascular/Lymphatic: Aortic atherosclerosis. No evidence of aneurysm or adenopathy. Reproductive: Prior hysterectomy.  No adnexal masses. Other: No free fluid or free air. Musculoskeletal: No acute bony abnormality. IMPRESSION: No acute findings in the abdomen or pelvis. Sigmoid diverticulosis. Bilateral adrenal nodules, likely adenomas. Aortic atherosclerosis. Electronically Signed   By: KRolm BaptiseM.D.   On: 05/25/2022 22:54   CT HEAD WO CONTRAST  Result Date: 05/25/2022 CLINICAL DATA:  Altered level of consciousness, neurologic deficit EXAM: CT HEAD WITHOUT CONTRAST TECHNIQUE: Contiguous axial images were obtained from the base of the skull through the vertex without intravenous contrast. RADIATION DOSE REDUCTION: This exam was performed according to the departmental dose-optimization program which includes automated exposure control, adjustment of the mA and/or kV according to patient size and/or use of iterative reconstruction technique. COMPARISON:  05/11/2018 report only, images unavailable FINDINGS: Brain: Encephalomalacia is seen within the right MCA territory and within a small region of the left parietal cortex, consistent with chronic infarcts. Hypodensities throughout the periventricular and subcortical white matter are most consistent with chronic small vessel ischemic change. No evidence of acute infarct or hemorrhage. Lateral ventricles and midline structures are unremarkable. No acute extra-axial fluid collections.  No mass effect. Vascular: Atherosclerosis of the internal carotid arteries. No hyperdense vessel. Skull: Normal. Negative for fracture or focal lesion. Sinuses/Orbits: No acute finding. Other: None. IMPRESSION: 1. Chronic ischemic changes as above. No acute intracranial process. Electronically Signed   By: MRanda NgoM.D.   On: 05/25/2022 21:00    EKG: I independently viewed the EKG done and my findings are as followed: Normal sinus rhythm at a rate of 61 bpm  Assessment/Plan Present on Admission:  Altered mental status  Essential hypertension  Coronary atherosclerosis of native coronary artery  Principal Problem:   Altered mental status Active Problems:   Coronary atherosclerosis of native coronary artery   Essential hypertension   Hypertensive urgency   Mixed hyperlipidemia   Type 2 diabetes mellitus with  hyperglycemia ([place])   History of stroke  Altered mental status, rule out TIA versus acute ischemic stroke Patient's husband reported slurred speech inpatient, however, patient's speech was already back to normal in the ED Patient will be admitted to telemetry unit  CT head without contrast showed no acute intracranial process No intracranial arterial occlusion or hemodynamically significant stenosis noted on CT angiography of head and neck Echocardiogram in the morning MRI of brain without contrast in the morning Continue home aspirin and statin after MRI  Continue fall precautions and neuro checks Lipid panel and hemoglobin A1c will be checked Continue PT/OT eval and treat Bedside swallow eval by nursing prior to diet Consider SLP consult if patient fails bedside swallow screen Tele neurologist will be consulted and we shall await further recommendations  Hypertensive urgency-resolved Essential hypertension  Antihypertensives PRN if Blood pressure is greater than 220/120 or there is a concern for End organ damage/contraindications for permissive HTN. If blood pressure is  greater than 220/120 give labetalol PO or IV or Vasotec IV with a goal of 15% reduction in BP during the first 24 hours.  T2DM with hyperglycemia Continue ISS and hypoglycemia protocol  Mixed hyperlipidemia Continue Lipitor when patient resumes oral intake  CAD/Prior stroke Continue aspirin and Lipitor when patient resumes oral intake  DVT prophylaxis: SCDs (consider restarting home warfarin after MRI of brain)  Code Status: Full code  Family Communication: Husband at bedside (all questions answered to satisfaction)  Consults: Neurology  Severity of Illness: The appropriate patient status for this patient is OBSERVATION. Observation status is judged to be reasonable and necessary in order to provide the required intensity of service to ensure the patient's safety. The patient's presenting symptoms, physical exam findings, and initial radiographic and laboratory data in the context of their medical condition is felt to place them at decreased risk for further clinical deterioration. Furthermore, it is anticipated that the patient will be medically stable for discharge from the hospital within 2 midnights of admission.   Author: Bernadette Hoit, DO 05/26/2022 4:26 AM  For on call review [email or web address].

## 2022-05-26 NOTE — ED Notes (Signed)
Pt remains altered. Pt pulling off gown and monitoring. Pt placed on [place] Bed alarm and mittens placed on pt hands.  Pt attempted to get out of bed.   Husband is not able to stay at bedside, his number is in the chart if he needs to be called with any updates.

## 2022-05-26 NOTE — Progress Notes (Signed)
Patient up to bathroom with assistance. Gait steady. Voided without difficulty.

## 2022-05-26 NOTE — Progress Notes (Signed)
PROGRESS NOTE    Rachel Perkins  EXB:[id number] DOB: 26-Nov-1950 DOA: 05/25/2022 PCP: Monico Blitz, MD    Brief Narrative:   Rachel Perkins is a 72 y.o. female with past medical history significant for essential hypertension, hyperlipidemia, CAD s/p stent, type 2 diabetes mellitus, paroxysmal atrial fibrillation on Coumadin, history of CVA with residual left-sided weakness who presented to [facility], ED on 1/15 with confusion/altered mental status per family.  Husband reports approximately 2 PM on 1/15 he noticed that she was more confused, not following commands appropriately and asking strange questions.  Patient was reportedly slurring her words and difficult to understand.  She also reported weakness to the right side, dropping items and unable to open a jar (which is new from baseline).  Patient is on Coumadin, but medication was on hold earlier this month per husband, but had restarted over the last week.  Husband brought his specimen for further evaluation in the ED.  In the ED, BP 217/59, temperature 98.0 F, HR 60, RR 18, BP 94% on room air.  WBC 12.6, hemoglobin 16.8, platelets 302.  Sodium 135, potassium 4.3, chloride 100, CO2 23, glucose 152, BUN 15, creatinine 1.13.  AST 22, ALT 27, total bilirubin 0.9.  Cova-19/influenza A/B/RSV PCR negative.  Urinalysis unrevealing.  EtOH level less than 10.  UDS negative.  CT head without contrast with chronic ischemic changes but no acute intracranial process.  CT angiogram head/neck with age-indeterminate occlusion of the left vertebral artery V1 and proximal V2 segments, no intracranial arterial occlusion or hemodynamically significant stenosis, bilateral carotid bifurcation atherosclerosis without stenosis or occlusion.  Chest x-ray with no active cardiopulmonary disease process.  CT abdomen/pelvis with no acute findings in the abdomen/pelvis.  TRH was consulted for admission for further evaluation management of concern for TIA versus  CVA.  Assessment & Plan:   Expressive aphasia likely secondary to TIA Hx CVA Patient presenting to ED with confusion, not following commands appropriately, slurred speech and right-sided weakness.  History of CVA and A-fib currently on Coumadin.  Patient is afebrile without leukocytosis.  Urinalysis unrevealing.  UDS, EtOH level negative. CT head without contrast with chronic ischemic changes but no acute intracranial process.  CT angiogram head/neck with age-indeterminate occlusion of the left vertebral artery V1 and proximal V2 segments, no intracranial arterial occlusion or hemodynamically significant stenosis, bilateral carotid bifurcation atherosclerosis without stenosis or occlusion.  MR brain without contrast with no evidence of acute intracranial normality, chronic cortically based infarcts within the bilateral cerebral hemispheres, chronic lacunar infarct within the right basal ganglia/internal capsule, chronic small vessel ischemic changes and tiny chronic infarct within the left cerebellar hemisphere.  TTE with LVEF 70 to 10%, grade 1 diastolic dysfunction, IVC normal, no LV regional wall motion normalities, no intra-atrial shunt identified.  LDL 45, hemoglobin A1c 6.9. -- OT recommends home health -- PT consult: Pending -- Continue warfarin, pharmacy consulted for dosing/monitoring -- Aspirin 81 mg p.o. daily -- Atorvastatin 80 mg p.o. daily -- Continue monitor on telemetry  Paroxysmal atrial fibrillation on anticoagulation Subtherapeutic INR Essential hypertension -- Metoprolol tartrate 25 g p.o. twice daily -- Continue warfarin, pharmacy consulted for dosing/monitoring (goal INR 2-3) -- INR daily  Hyperlipidemia LDL 45, at goal. -- Continue atorvastatin 80 mg p.o. daily  Type 2 diabetes mellitus Hemoglobin A1c 6.9, well-controlled.  Home regimen includes metformin 500 mg p.o. twice daily. -- Hold oral hypoglycemics while inpatient -- SSI for coverage -- CBG before every  meal/at bedtime  Peripheral neuropathy --  Gabapentin 300 mg p.o. 3 times daily  Depression -- Lexapro 10 mg p.o. daily   DVT prophylaxis:  warfarin (COUMADIN) tablet 5 mg    Code Status: Prior Family Communication: Updated patient's spouse via telephone this afternoon  Disposition Plan:  Level of care: Telemetry Status is: Observation The patient remains OBS appropriate and will d/c before 2 midnights.     Procedures:  TTE  Antimicrobials:  None   Subjective: Patient seen exam bedside, resting comfortably.  Lying in bed.  Continues with mild expressive aphasia, improved per spouse since yesterday; but still not back at baseline.  Seen by OT with recommendations of home health.  Pending PT evaluation.  Patient with no other complaints or concerns at this time.  Denies headache, no dizziness, no chest pain, no palpitations, no shortness of breath, no abdominal pain, no fever/chills, no nausea/vomiting/diarrhea.  No acute events overnight per nursing staff.  Objective: Vitals:   05/26/22 0330 05/26/22 0630 05/26/22 0747 05/26/22 0935  BP: 130/78 130/65 136/71 133/72  Pulse: 72 68 69 71  Resp: '14 19 20 18  '$ Temp:  98.7 F (37.1 C) 98.8 F (37.1 C) 98 F (36.7 C)  TempSrc:  Oral Oral Oral  SpO2: 98% 97% 95% 98%    Intake/Output Summary (Last 24 hours) at 05/26/2022 1312 Last data filed at 05/26/2022 0010 Gross per 24 hour  Intake 1000 ml  Output --  Net 1000 ml   There were no vitals filed for this visit.  Examination:  Physical Exam: GEN: NAD, alert and oriented x 3, elderly in appearance, mild expressive aphasia HEENT: NCAT, PERRL, EOMI, sclera clear, MMM PULM: CTAB w/o wheezes/crackles, normal respiratory effort CV: RRR w/o M/G/R GI: abd soft, NTND, NABS, no R/G/M MSK: no peripheral edema, muscle strength globally intact 5/5 bilateral upper/lower extremities NEURO: CN II-XII intact, mild expressive aphasia, otherwise no focal deficits, sensation to light  touch intact PSYCH: normal mood/affect Integumentary: dry/intact, no rashes or wounds    Data Reviewed: I have personally reviewed following labs and imaging studies  CBC: Recent Labs  Lab 05/25/22 2026  WBC 12.6*  NEUTROABS 9.4*  HGB 16.8*  HCT 49.9*  MCV 93.6  PLT 124   Basic Metabolic Panel: Recent Labs  Lab 05/25/22 2026  NA 135  K 4.3  CL 100  CO2 23  GLUCOSE 152*  BUN 15  CREATININE 1.13*  CALCIUM 9.9   GFR: CrCl cannot be calculated (Unknown ideal weight.). Liver Function Tests: Recent Labs  Lab 05/25/22 2026  AST 22  ALT 27  ALKPHOS 95  BILITOT 0.9  PROT 8.2*  ALBUMIN 4.7   No results for input(s): "LIPASE", "AMYLASE" in the last 168 hours. No results for input(s): "AMMONIA" in the last 168 hours. Coagulation Profile: Recent Labs  Lab 05/25/22 2026  INR 1.5*   Cardiac Enzymes: No results for input(s): "CKTOTAL", "CKMB", "CKMBINDEX", "TROPONINI" in the last 168 hours. BNP (last 3 results) No results for input(s): "PROBNP" in the last 8760 hours. HbA1C: Recent Labs    05/26/22 0500  HGBA1C 6.9*   CBG: Recent Labs  Lab 05/25/22 2019 05/26/22 0504 05/26/22 0806 05/26/22 1151  GLUCAP 141* 158* 115* 121*   Lipid Profile: Recent Labs    05/26/22 0500  CHOL 105  HDL 45  LDLCALC 48  TRIG 58  CHOLHDL 2.3   Thyroid Function Tests: No results for input(s): "TSH", "T4TOTAL", "FREET4", "T3FREE", "THYROIDAB" in the last 72 hours. Anemia Panel: No results for input(s): "VITAMINB12", "FOLATE", "FERRITIN", "  TIBC", "IRON", "RETICCTPCT" in the last 72 hours. Sepsis Labs: No results for input(s): "PROCALCITON", "LATICACIDVEN" in the last 168 hours.  Recent Results (from the past 240 hour(s))  Resp panel by RT-PCR (RSV, Flu A&B, Covid) Anterior Nasal Swab     Status: None   Collection Time: 05/25/22 10:07 PM   Specimen: Anterior Nasal Swab  Result Value Ref Range Status   SARS Coronavirus 2 by RT PCR NEGATIVE NEGATIVE Final    Comment:  (NOTE) SARS-CoV-2 target nucleic acids are NOT DETECTED.  The SARS-CoV-2 RNA is generally detectable in upper respiratory specimens during the acute phase of infection. The lowest concentration of SARS-CoV-2 viral copies this assay can detect is 138 copies/mL. A negative result does not preclude SARS-Cov-2 infection and should not be used as the sole basis for treatment or other patient management decisions. A negative result may occur with  improper specimen collection/handling, submission of specimen other than nasopharyngeal swab, presence of viral mutation(s) within the areas targeted by this assay, and inadequate number of viral copies(<138 copies/mL). A negative result must be combined with clinical observations, patient history, and epidemiological information. The expected result is Negative.  Fact Sheet for Patients:  EntrepreneurPulse.com.au  Fact Sheet for Healthcare Providers:  IncredibleEmployment.be  This test is no t yet approved or cleared by the [place] FDA and  has been authorized for detection and/or diagnosis of SARS-CoV-2 by FDA under an Emergency Use Authorization (EUA). This EUA will remain  in effect (meaning this test can be used) for the duration of the COVID-19 declaration under Section 564(b)(1) of the Act, 21 U.S.C.section 360bbb-3(b)(1), unless the authorization is terminated  or revoked sooner.       Influenza A by PCR NEGATIVE NEGATIVE Final   Influenza B by PCR NEGATIVE NEGATIVE Final    Comment: (NOTE) The Xpert Xpress SARS-CoV-2/FLU/RSV plus assay is intended as an aid in the diagnosis of influenza from Nasopharyngeal swab specimens and should not be used as a sole basis for treatment. Nasal washings and aspirates are unacceptable for Xpert Xpress SARS-CoV-2/FLU/RSV testing.  Fact Sheet for Patients: EntrepreneurPulse.com.au  Fact Sheet for Healthcare  Providers: IncredibleEmployment.be  This test is not yet approved or cleared by the [place] FDA and has been authorized for detection and/or diagnosis of SARS-CoV-2 by FDA under an Emergency Use Authorization (EUA). This EUA will remain in effect (meaning this test can be used) for the duration of the COVID-19 declaration under Section 564(b)(1) of the Act, 21 U.S.C. section 360bbb-3(b)(1), unless the authorization is terminated or revoked.     Resp Syncytial Virus by PCR NEGATIVE NEGATIVE Final    Comment: (NOTE) Fact Sheet for Patients: EntrepreneurPulse.com.au  Fact Sheet for Healthcare Providers: IncredibleEmployment.be  This test is not yet approved or cleared by the [place] FDA and has been authorized for detection and/or diagnosis of SARS-CoV-2 by FDA under an Emergency Use Authorization (EUA). This EUA will remain in effect (meaning this test can be used) for the duration of the COVID-19 declaration under Section 564(b)(1) of the Act, 21 U.S.C. section 360bbb-3(b)(1), unless the authorization is terminated or revoked.  Performed at [facility], [address], [place], [place] [address]          Radiology Studies: ECHOCARDIOGRAM COMPLETE  Result Date: 05/26/2022    ECHOCARDIOGRAM REPORT   Patient Name:   MARIANNE GOLIGHTLY Date of Exam: 05/26/2022 Medical Rec #:  [id number]      Height:       62.0 in  Accession #:    [id number]     Weight:       136.0 lb Date of Birth:  07-18-1950      BSA:          1.623 m Patient Age:    45 years       BP:           136/71 mmHg Patient Gender: F              HR:           74 bpm. Exam Location:  [facility] Procedure: 2D Echo, Cardiac Doppler and Color Doppler Indications:    Stroke  History:        Patient has no prior history of Echocardiogram examinations. CAD                 and Previous Myocardial Infarction, Stroke,                 Signs/Symptoms:Altered Mental Status;  Risk Factors:Hypertension,                 Diabetes, Dyslipidemia and Current Smoker.  Sonographer:    Wenda Low Referring Phys: [id number] OLADAPO ADEFESO IMPRESSIONS  1. Left ventricular ejection fraction, by estimation, is 70 to 75%. The left ventricle has hyperdynamic function. The left ventricle has no regional wall motion abnormalities. Left ventricular diastolic parameters are consistent with Grade I diastolic dysfunction (impaired relaxation).  2. Right ventricular systolic function is normal. The right ventricular size is normal. Tricuspid regurgitation signal is inadequate for assessing PA pressure.  3. The mitral valve is normal in structure. No evidence of mitral valve regurgitation. No evidence of mitral stenosis.  4. The aortic valve is tricuspid. Aortic valve regurgitation is not visualized. No aortic stenosis is present.  5. The inferior vena cava is normal in size with greater than 50% respiratory variability, suggesting right atrial pressure of 3 mmHg. FINDINGS  Left Ventricle: Left ventricular ejection fraction, by estimation, is 70 to 75%. The left ventricle has hyperdynamic function. The left ventricle has no regional wall motion abnormalities. The left ventricular internal cavity size was normal in size. There is no left ventricular hypertrophy. Left ventricular diastolic parameters are consistent with Grade I diastolic dysfunction (impaired relaxation). Normal left ventricular filling pressure. Right Ventricle: The right ventricular size is normal. Right vetricular wall thickness was not well visualized. Right ventricular systolic function is normal. Tricuspid regurgitation signal is inadequate for assessing PA pressure. Left Atrium: Left atrial size was normal in size. Right Atrium: Right atrial size was normal in size. Pericardium: There is no evidence of pericardial effusion. Mitral Valve: The mitral valve is normal in structure. No evidence of mitral valve regurgitation. No evidence of  mitral valve stenosis. MV peak gradient, 4.4 mmHg. The mean mitral valve gradient is 2.0 mmHg. Tricuspid Valve: The tricuspid valve is normal in structure. Tricuspid valve regurgitation is not demonstrated. No evidence of tricuspid stenosis. Aortic Valve: The aortic valve is tricuspid. Aortic valve regurgitation is not visualized. No aortic stenosis is present. Aortic valve mean gradient measures 3.0 mmHg. Aortic valve peak gradient measures 5.6 mmHg. Aortic valve area, by VTI measures 2.48 cm. Pulmonic Valve: The pulmonic valve was not well visualized. Pulmonic valve regurgitation is not visualized. No evidence of pulmonic stenosis. Aorta: The aortic root is normal in size and structure. Venous: The inferior vena cava is normal in size with greater than 50% respiratory variability, suggesting right atrial pressure of 3 mmHg.  IAS/Shunts: No atrial level shunt detected by color flow Doppler.  LEFT VENTRICLE PLAX 2D LVIDd:         4.30 cm   Diastology LVIDs:         2.40 cm   LV e' medial:    6.85 cm/s LV PW:         0.90 cm   LV E/e' medial:  9.6 LV IVS:        1.00 cm   LV e' lateral:   7.72 cm/s LVOT diam:     1.90 cm   LV E/e' lateral: 8.5 LV SV:         65 LV SV Index:   40 LVOT Area:     2.84 cm  RIGHT VENTRICLE RV Basal diam:  2.15 cm RV Mid diam:    2.00 cm RV S prime:     11.00 cm/s TAPSE (M-mode): 2.0 cm LEFT ATRIUM             Index        RIGHT ATRIUM          Index LA diam:        3.30 cm 2.03 cm/m   RA Area:     9.05 cm LA Vol (A2C):   30.3 ml 18.67 ml/m  RA Volume:   17.30 ml 10.66 ml/m LA Vol (A4C):   31.6 ml 19.47 ml/m LA Biplane Vol: 32.0 ml 19.72 ml/m  AORTIC VALVE                    PULMONIC VALVE AV Area (Vmax):    2.74 cm     PV Vmax:       1.13 m/s AV Area (Vmean):   2.53 cm     PV Peak grad:  5.1 mmHg AV Area (VTI):     2.48 cm AV Vmax:           118.00 cm/s AV Vmean:          80.400 cm/s AV VTI:            0.262 m AV Peak Grad:      5.6 mmHg AV Mean Grad:      3.0 mmHg LVOT Vmax:          114.00 cm/s LVOT Vmean:        71.700 cm/s LVOT VTI:          0.229 m LVOT/AV VTI ratio: 0.87  AORTA Ao Root diam: 2.80 cm MITRAL VALVE MV Area (PHT): 2.38 cm    SHUNTS MV Area VTI:   2.39 cm    Systemic VTI:  0.23 m MV Peak grad:  4.4 mmHg    Systemic Diam: 1.90 cm MV Mean grad:  2.0 mmHg MV Vmax:       1.05 m/s MV Vmean:      60.5 cm/s MV Decel Time: 319 msec MV E velocity: 65.60 cm/s MV A velocity: 96.40 cm/s MV E/A ratio:  0.68 Carlyle Dolly MD Electronically signed by Carlyle Dolly MD Signature Date/Time: 05/26/2022/1:03:37 PM    Final    MR BRAIN WO CONTRAST  Result Date: 05/26/2022 CLINICAL DATA:  Provided history: Neuro deficit, acute, stroke suspected. Altered mental status. Confusion. History of stroke. EXAM: MRI HEAD WITHOUT CONTRAST TECHNIQUE: Multiplanar, multiecho pulse sequences of the brain and surrounding structures were obtained without intravenous contrast. COMPARISON:  Noncontrast head CT 05/25/2022. CT angiogram head/neck 05/26/2022. FINDINGS: Intermittently motion degraded examination. Most notably, the axial  T2 FLAIR sequence is severely motion degraded, the axial T1 weighted sequence is moderate to severely motion degraded and the coronal T2 sequence is moderately motion degraded. Within this limitation, findings are as follows. Brain: No age advanced or lobar predominant parenchymal atrophy. Redemonstrated chronic cortical/subcortical right MCA territory infarct within the right insula, right frontoparietal operculum and right temporoparietal junction. Redemonstrated chronic cortically-based infarcts within the mid left frontal lobe and left parietal lobe (left MCA and left PCA vascular territories, respectively). Background moderate multifocal T2 FLAIR hyperintense signal abnormality within the cerebral white matter, nonspecific but compatible with chronic small vessel ischemic disease. Chronic infarct within the right basal ganglia/anterior limb of internal capsule. Tiny  chronic infarct within the left cerebellar hemisphere. There is no acute infarct. No evidence of an intracranial mass. No chronic intracranial blood products. No extra-axial fluid collection. No midline shift. Vascular: Maintained flow voids within the proximal large arterial vessels. Skull and upper cervical spine: No focal suspicious marrow lesion. Sinuses/Orbits: No mass or acute finding within the imaged orbits. Prior bilateral ocular lens replacement. No significant paranasal sinus disease. Other: Trace fluid within the left mastoid air cells. IMPRESSION: 1. Intermittently motion degraded examination. 2. No evidence of acute intracranial abnormality. The diffusion-weighted imaging is of good quality, and there is no evidence of an acute infarct. 3. Chronic cortically-based infarcts within the bilateral cerebral hemispheres, as described. 4. Chronic lacunar infarct within the right basal ganglia/internal capsule. 5. Background moderate chronic small vessel ischemic changes within the cerebral white matter. 6. Tiny chronic infarct within the left cerebellar hemisphere. Electronically Signed   By: Kellie Simmering D.O.   On: 05/26/2022 09:11   CT ANGIO HEAD NECK W WO CM  Result Date: 05/26/2022 CLINICAL DATA:  Stroke/TIA.  Altered mental status. EXAM: CT ANGIOGRAPHY HEAD AND NECK TECHNIQUE: Multidetector CT imaging of the head and neck was performed using the standard protocol during bolus administration of intravenous contrast. Multiplanar CT image reconstructions and MIPs were obtained to evaluate the vascular anatomy. Carotid stenosis measurements (when applicable) are obtained utilizing NASCET criteria, using the distal internal carotid diameter as the denominator. RADIATION DOSE REDUCTION: This exam was performed according to the departmental dose-optimization program which includes automated exposure control, adjustment of the mA and/or kV according to patient size and/or use of iterative reconstruction  technique. CONTRAST:  5m OMNIPAQUE IOHEXOL 350 MG/ML SOLN COMPARISON:  05/11/2018 CTA HEAD NECK report (images not available) FINDINGS: CTA NECK FINDINGS Aortic arch: Normal aortic arch caliber. There is an independent origin of the left vertebral artery. Right carotid system: Minimal atherosclerotic calcification at the bifurcation without stenosis or occlusion. Left carotid system: Moderate atherosclerotic calcification at the bifurcation and proximal ICA without stenosis or occlusion. Vertebral arteries: Right dominant configuration. The left vertebral artery V1 and proximal V2 segments are occluded. The right vertebral artery is normal to the skull base. Skeleton: C5-7 ACDF.  No acute abnormality. Other neck: Negative. Upper chest: Negative. Review of the MIP images confirms the above findings CTA HEAD FINDINGS Anterior circulation: There is atherosclerotic calcification of the internal carotid arteries at the skull base without hemodynamically significant stenosis. Normal anterior and middle cerebral arteries. Posterior circulation: No significant stenosis, proximal occlusion, aneurysm, or vascular malformation. Venous sinuses: As permitted by contrast timing, patent. Anatomic variants: None Review of the MIP images confirms the above findings IMPRESSION: 1. Age-indeterminate occlusion of the left vertebral artery V1 and proximal V2 segments. This is new since 05/11/18. 2. No intracranial arterial occlusion or hemodynamically significant stenosis.  3. Bilateral carotid bifurcation atherosclerosis without stenosis or occlusion. Electronically Signed   By: Ulyses Jarred M.D.   On: 05/26/2022 01:32   DG Chest Portable 1 View  Result Date: 05/25/2022 CLINICAL DATA:  Altered mental status EXAM: PORTABLE CHEST 1 VIEW COMPARISON:  None Available. FINDINGS: The heart size and mediastinal contours are within normal limits. Both lungs are clear. The visualized skeletal structures are unremarkable. IMPRESSION: No  active disease. Electronically Signed   By: Ulyses Jarred M.D.   On: 05/25/2022 23:07   CT ABDOMEN PELVIS W CONTRAST  Result Date: 05/25/2022 CLINICAL DATA:  Bowel obstruction suspected.  Vomiting. EXAM: CT ABDOMEN AND PELVIS WITH CONTRAST TECHNIQUE: Multidetector CT imaging of the abdomen and pelvis was performed using the standard protocol following bolus administration of intravenous contrast. RADIATION DOSE REDUCTION: This exam was performed according to the departmental dose-optimization program which includes automated exposure control, adjustment of the mA and/or kV according to patient size and/or use of iterative reconstruction technique. CONTRAST:  42m OMNIPAQUE IOHEXOL 300 MG/ML  SOLN COMPARISON:  None Available. FINDINGS: Lower chest: No acute findings Hepatobiliary: No focal hepatic abnormality. Gallbladder unremarkable. Pancreas: No focal abnormality or ductal dilatation. Spleen: No focal abnormality.  Normal size. Adrenals/Urinary Tract: Small bilateral adrenal nodules, 12 mm on the right in 10 mm on the left. No renal stones or hydronephrosis. Urinary bladder unremarkable. Stomach/Bowel: Sigmoid diverticulosis. No active diverticulitis. Stomach and small bowel decompressed, unremarkable. Vascular/Lymphatic: Aortic atherosclerosis. No evidence of aneurysm or adenopathy. Reproductive: Prior hysterectomy.  No adnexal masses. Other: No free fluid or free air. Musculoskeletal: No acute bony abnormality. IMPRESSION: No acute findings in the abdomen or pelvis. Sigmoid diverticulosis. Bilateral adrenal nodules, likely adenomas. Aortic atherosclerosis. Electronically Signed   By: KRolm BaptiseM.D.   On: 05/25/2022 22:54   CT HEAD WO CONTRAST  Result Date: 05/25/2022 CLINICAL DATA:  Altered level of consciousness, neurologic deficit EXAM: CT HEAD WITHOUT CONTRAST TECHNIQUE: Contiguous axial images were obtained from the base of the skull through the vertex without intravenous contrast. RADIATION DOSE  REDUCTION: This exam was performed according to the departmental dose-optimization program which includes automated exposure control, adjustment of the mA and/or kV according to patient size and/or use of iterative reconstruction technique. COMPARISON:  05/11/2018 report only, images unavailable FINDINGS: Brain: Encephalomalacia is seen within the right MCA territory and within a small region of the left parietal cortex, consistent with chronic infarcts. Hypodensities throughout the periventricular and subcortical white matter are most consistent with chronic small vessel ischemic change. No evidence of acute infarct or hemorrhage. Lateral ventricles and midline structures are unremarkable. No acute extra-axial fluid collections. No mass effect. Vascular: Atherosclerosis of the internal carotid arteries. No hyperdense vessel. Skull: Normal. Negative for fracture or focal lesion. Sinuses/Orbits: No acute finding. Other: None. IMPRESSION: 1. Chronic ischemic changes as above. No acute intracranial process. Electronically Signed   By: MRanda NgoM.D.   On: 05/25/2022 21:00        Scheduled Meds:  aspirin EC  81 mg Oral Daily   atorvastatin  80 mg Oral Daily   escitalopram  10 mg Oral Daily   gabapentin  300 mg Oral TID   insulin aspart  0-9 Units Subcutaneous Q4H   metoprolol tartrate  25 mg Oral BID   warfarin  5 mg Oral ONCE-1600   Warfarin - Pharmacist Dosing Inpatient   Does not apply q1600   Continuous Infusions:   LOS: 0 days    Time spent: 50 minutes spent  on chart review, discussion with nursing staff, consultants, updating family and interview/physical exam; more than 50% of that time was spent in counseling and/or coordination of care.    Safari Cinque J [place] DO [facility] Available via Epic secure chat 7am-7pm After these hours, please refer to coverage provider listed on [email or web address] 05/26/2022, 1:12 PM

## 2022-05-26 NOTE — Evaluation (Signed)
Physical Therapy Evaluation Patient Details Name: Rachel Perkins MRN: [id number] DOB: 13-Mar-1951 Today's Date: 05/26/2022  History of Present Illness  Rachel Perkins is a 72 y.o. female with medical history significant of hypertension, hyperlipidemia, CAD s/p stent placement, T2DM, prior stroke with residual left-sided weakness who presents to the emergency department accompanied by husband due to altered mental status.  Patient was unable to provide history, history was obtained from ED PA and husband at bedside.  Per report, LKW was unknown, but patient's husband states that he called patient while outside of home around 2 PM and he had difficulty in being able to understand what she was saying on the phone, so he returned home, and at home, he states that patient was slurring her words and was still difficult to understand her.  Patient was also reported to on the right side, since she was dropping things and was unable to open a jar (new from baseline).  Patient took warfarin this evening and threw up.  Patient was taken to the ED for further evaluation and management.   Clinical Impression  Patient functioning near baseline for functional mobility and gait other than having decreased memory, can ambulate in room/hallway without AD, but requires verbal cues to let arms swing with fair carryover, no loss of balance and limited mostly due to fatigue.  Patient demonstrates fair/good return for transferring to/from commode in bathroom and left seated at bedside working with OT.  Patient will benefit from continued skilled physical therapy in hospital and recommended venue below to increase strength, balance, endurance for safe ADLs and gait.         Recommendations for follow up therapy are one component of a multi-disciplinary discharge planning process, led by the attending physician.  Recommendations may be updated based on patient status, additional functional criteria and insurance  authorization.  Follow Up Recommendations Home health PT      Assistance Recommended at Discharge Intermittent Supervision/Assistance  Patient can return home with the following  A little help with walking and/or transfers;A little help with bathing/dressing/bathroom;Help with stairs or ramp for entrance;Assistance with cooking/housework    Equipment Recommendations    Recommendations for Other Services       Functional Status Assessment Patient has had a recent decline in their functional status and demonstrates the ability to make significant improvements in function in a reasonable and predictable amount of time.     Precautions / Restrictions Precautions Precautions: Fall Restrictions Weight Bearing Restrictions: No      Mobility  Bed Mobility Overal bed mobility: Modified Independent                  Transfers Overall transfer level: Needs assistance Equipment used: 1 person hand held assist Transfers: Sit to/from Stand, Bed to chair/wheelchair/BSC Sit to Stand: Supervision, Min guard   Step pivot transfers: Supervision, Min guard       General transfer comment: occasional leaning on nearby objects for support, demonstrates good return for transferring to/from commode in bathroom and washing hands over sink without requiring use of an AD    Ambulation/Gait Ambulation/Gait assistance: Supervision Gait Distance (Feet): 85 Feet Assistive device: None Gait Pattern/deviations: Decreased step length - left, Decreased stance time - right, Decreased stride length Gait velocity: decreased     General Gait Details: requires occasional verbal cues to let arms swing with fair carryover, no loss of balance with good return for ambulating in room/hallway without requiring use of an AD  Stairs  Wheelchair Mobility    Modified Rankin (Stroke Patients Only)       Balance Overall balance assessment: Mild deficits observed, not formally tested                                            Pertinent Vitals/Pain Pain Assessment Pain Assessment: No/denies pain    Home Living Family/patient expects to be discharged to:: Private residence Living Arrangements: Spouse/significant other Available Help at Discharge: Family;Available PRN/intermittently Type of Home: House Home Access: Stairs to enter Entrance Stairs-Rails: Can reach both Entrance Stairs-Number of Steps: 2   Home Layout: One level Home Equipment: Conservation officer, nature (2 wheels);Cane - single point;BSC/3in1;Shower seat;Grab bars - tub/shower      Prior Function Prior Level of Function : Independent/Modified Independent             Mobility Comments: Hydrographic surveyor without AD ADLs Comments: Indepndent ; drives     Hand Dominance   Dominant Hand: Right    Extremity/Trunk Assessment   Upper Extremity Assessment Upper Extremity Assessment: Defer to OT evaluation    Lower Extremity Assessment Lower Extremity Assessment: Generalized weakness    Cervical / Trunk Assessment Cervical / Trunk Assessment: Normal  Communication   Communication: Expressive difficulties;Other (comment) (has difficulty with memory)  Cognition Arousal/Alertness: Awake/alert Behavior During Therapy: WFL for tasks assessed/performed Overall Cognitive Status: Within Functional Limits for tasks assessed                                 General Comments: has difficulty with memory and able to follow instructions consistently        General Comments      Exercises     Assessment/Plan    PT Assessment Patient needs continued PT services  PT Problem List Decreased strength;Decreased activity tolerance;Decreased balance;Decreased mobility       PT Treatment Interventions DME instruction;Gait training;Stair training;Functional mobility training;Therapeutic activities;Therapeutic exercise;Patient/family education;Balance training    PT Goals  (Current goals can be found in the Care Plan section)  Acute Rehab PT Goals Patient Stated Goal: return home with family to assist PT Goal Formulation: With patient Time For Goal Achievement: 05/29/22 Potential to Achieve Goals: Good    Frequency Min 3X/week     Co-evaluation PT/OT/SLP Co-Evaluation/Treatment: Yes Reason for Co-Treatment: To address functional/ADL transfers PT goals addressed during session: Mobility/safety with mobility;Balance         AM-PAC PT "6 Clicks" Mobility  Outcome Measure Help needed turning from your back to your side while in a flat bed without using bedrails?: None Help needed moving from lying on your back to sitting on the side of a flat bed without using bedrails?: None Help needed moving to and from a bed to a chair (including a wheelchair)?: A Little Help needed standing up from a chair using your arms (e.g., wheelchair or bedside chair)?: A Little Help needed to walk in hospital room?: A Little Help needed climbing 3-5 steps with a railing? : A Little 6 Click Score: 20    End of Session   Activity Tolerance: Patient tolerated treatment well;Patient limited by fatigue Patient left: in bed;with call bell/phone within reach Nurse Communication: Mobility status PT Visit Diagnosis: Unsteadiness on feet (R26.81);Other abnormalities of gait and mobility (R26.89);Muscle weakness (generalized) (M62.81)    Time: 3267-1245 PT Time Calculation (  min) (ACUTE ONLY): 24 min   Charges:   PT Evaluation $PT Eval Moderate Complexity: 1 Mod PT Treatments $Therapeutic Activity: 23-37 mins        2:27 PM, 05/26/22 Lonell Grandchild, MPT Physical Therapist with [facility] 336 [phone] office [phone] mobile phone

## 2022-05-26 NOTE — Evaluation (Signed)
Occupational Therapy Evaluation Patient Details Name: Rachel Perkins MRN: [id number] DOB: 23-Nov-1950 Today's Date: 05/26/2022   History of Present Illness Rachel Perkins is a 72 y.o. female with medical history significant of hypertension, hyperlipidemia, CAD s/p stent placement, T2DM, prior stroke with residual left-sided weakness who presents to the emergency department accompanied by husband due to altered mental status.  Patient was unable to provide history, history was obtained from ED PA and husband at bedside.  Per report, LKW was unknown, but patient's husband states that he called patient while outside of home around 2 PM and he had difficulty in being able to understand what she was saying on the phone, so he returned home, and at home, he states that patient was slurring her words and was still difficult to understand her.  Patient was also reported to on the right side, since she was dropping things and was unable to open a jar (new from baseline).  Patient took warfarin this evening and threw up.  Patient was taken to the ED for further evaluation and management. (per DO)   Clinical Impression   Pt agreeable to OT and PT co-evaluation. Pt still reports feeling "fuzzy," but was oriented to year, month, self, and partially to place. Pt demonstrates mixed visual perceptual deficits. See vision section for details. Pt mildly unsteady in standing needing supervision to min G assist for standing ADL's and transfers. No significant strength deficits. Slow fine motor skills which may be baseline. Pt will benefit from continued OT in the hospital and recommended venue below to increase strength, balance, and endurance for safe ADL's.        Recommendations for follow up therapy are one component of a multi-disciplinary discharge planning process, led by the attending physician.  Recommendations may be updated based on patient status, additional functional criteria and insurance authorization.    Follow Up Recommendations  Home health OT     Assistance Recommended at Discharge Set up Supervision/Assistance  Patient can return home with the following A little help with walking and/or transfers;Assistance with cooking/housework;Assist for transportation;A little help with bathing/dressing/bathroom    Functional Status Assessment  Patient has had a recent decline in their functional status and demonstrates the ability to make significant improvements in function in a reasonable and predictable amount of time.  Equipment Recommendations  None recommended by OT    Recommendations for Other Services Other (comment) (Optometrist or other vision sepcialist to determine if pt's vision is baseline or altered.)     Precautions / Restrictions Precautions Precautions: Fall Restrictions Weight Bearing Restrictions: No      Mobility Bed Mobility Overal bed mobility: Modified Independent                  Transfers Overall transfer level: Needs assistance   Transfers: Sit to/from Stand, Bed to chair/wheelchair/BSC Sit to Stand: Supervision, Min guard     Step pivot transfers: Supervision, Min guard     General transfer comment: Mildly unsteady on feet.      Balance Overall balance assessment: Mild deficits observed, not formally tested                                         ADL either performed or assessed with clinical judgement   ADL Overall ADL's : Needs assistance/impaired     Grooming: Supervision/safety;Standing   Upper Body Bathing: Modified independent;Sitting  Lower Body Bathing: Modified independent;Sitting/lateral leans       Lower Body Dressing: Modified independent;Sitting/lateral leans   Toilet Transfer: Supervision/safety;Min guard Toilet Transfer Details (indicate cue type and reason): EOB to toilet and back with Min G to supervision. Toileting- Water quality scientist and Hygiene: Min guard;Sitting/lateral  lean Toileting - Clothing Manipulation Details (indicate cue type and reason): Pt able to complete peri-care and adjust briefs without physical assist.     Functional mobility during ADLs: Supervision/safety;Min guard       Vision Baseline Vision/History: 0 No visual deficits Ability to See in Adequate Light: 1 Impaired Patient Visual Report: No change from baseline;Other (comment) (Pt reported no visual issues but stated that she had done the clock test before in therapy and did not like it. This indicates pt may have residual deficits from prvious stroke.) Vision Assessment?: Yes Tracking/Visual Pursuits: Able to track stimulus in all quads without difficulty Visual Fields: Other (comment) (Mixed performance here. Pt iniitally demonstrated possibly R visual field deficit leading to the clock test where pt then demonstrated L visual field deficit which may be possible at baseline. Pt is still feeling "fuzzy" and seems unsure about history.) Additional Comments: Recommend pt follow up with optometrist or other vision specialist.     Perception     Praxis      Pertinent Vitals/Pain Pain Assessment Pain Assessment: No/denies pain     Hand Dominance Right   Extremity/Trunk Assessment Upper Extremity Assessment Upper Extremity Assessment:  (Overall WFL strength. Slow fine motor coordination and reports of decreased sensation in L UE but this could be from previous stroke.)   Lower Extremity Assessment Lower Extremity Assessment: Defer to PT evaluation   Cervical / Trunk Assessment Cervical / Trunk Assessment: Normal   Communication Communication Communication: Expressive difficulties (Some difficulty word finding.)   Cognition Arousal/Alertness: Awake/alert Behavior During Therapy: WFL for tasks assessed/performed Overall Cognitive Status: No family/caregiver present to determine baseline cognitive functioning                                 General Comments:  Pt able to follow commands but reported feeling "fuzzy."                      Home Living Family/patient expects to be discharged to:: Private residence Living Arrangements: Spouse/significant other Available Help at Discharge: Family;Available PRN/intermittently Type of Home: House Home Access: Stairs to enter [employer] of Steps: 2 Entrance Stairs-Rails: Can reach both Home Layout: One level     Bathroom Shower/Tub: Teacher, early years/pre: Handicapped height Bathroom Accessibility: Yes How Accessible: Accessible via walker Home Equipment: Shiner (2 wheels);Cane - single point;BSC/3in1;Shower seat;Grab bars - tub/shower          Prior Functioning/Environment Prior Level of Function : Independent/Modified Independent             Mobility Comments: Hydrographic surveyor without AD ADLs Comments: Indepndent ; drives        OT Problem List: Impaired balance (sitting and/or standing);Decreased cognition;Impaired sensation;Impaired vision/perception      OT Treatment/Interventions: Self-care/ADL training;Therapeutic exercise;Neuromuscular education;Therapeutic activities;Patient/family education;Visual/perceptual remediation/compensation    OT Goals(Current goals can be found in the care plan section) Acute Rehab OT Goals Patient Stated Goal: return home OT Goal Formulation: With patient Time For Goal Achievement: 06/09/22 Potential to Achieve Goals: Good  OT Frequency: Min 1X/week    Co-evaluation PT/OT/SLP Co-Evaluation/Treatment: Yes Reason  for Co-Treatment: To address functional/ADL transfers   OT goals addressed during session: ADL's and self-care      AM-PAC OT "6 Clicks" Daily Activity     Outcome Measure Help from another person eating meals?: None Help from another person taking care of personal grooming?: A Little Help from another person toileting, which includes using toliet, bedpan, or urinal?: A Little Help  from another person bathing (including washing, rinsing, drying)?: A Little Help from another person to put on and taking off regular upper body clothing?: A Little Help from another person to put on and taking off regular lower body clothing?: A Little 6 Click Score: 19   End of Session Equipment Utilized During Treatment: Gait belt  Activity Tolerance: Patient tolerated treatment well Patient left: in bed;with call bell/phone within reach;with bed alarm set  OT Visit Diagnosis: Unsteadiness on feet (R26.81);Other abnormalities of gait and mobility (R26.89);Other symptoms and signs involving the nervous system (M63.817)                Time: 7116-5790 OT Time Calculation (min): 32 min Charges:  OT General Charges $OT Visit: 1 Visit OT Evaluation $OT Eval Low Complexity: 1 Low  Kylani Wires OT, MOT   Larey Seat 05/26/2022, 9:58 AM

## 2022-05-26 NOTE — Progress Notes (Signed)
*  PRELIMINARY RESULTS* Echocardiogram 2D Echocardiogram has been performed.  Elpidio Anis 05/26/2022, 11:26 AM

## 2022-05-26 NOTE — ED Notes (Signed)
Pt moved to a room closer to nursing station due to [place] and attempted to get out of bed and pulling at monitors and lines

## 2022-05-26 NOTE — TOC Initial Note (Addendum)
Transition of Care [facility]) - Initial/Assessment Note    Patient Details  Name: Rachel Perkins MRN: [id number] Date of Birth: 19-Nov-1950  Transition of Care [facility]) CM/SW Contact:    Salome Arnt, [place] Phone Number: 05/26/2022, 11:32 AM  Clinical Narrative:  Pt admitted due to altered mental status. Assessment completed with pt's husband as pt oriented to self and place only per chart. Pt's husband reports pt's son also lives in the home. She is typically independent at baseline. PT/OT evaluated pt and recommend home health. Discussed with husband who requests [facility]. Sarah accepts referral. MD notified for orders.                 Update: [facility] does not have OT currently. MD notified and agreeable to just PT.    Expected Discharge Plan: [place] Barriers to Discharge: Continued Medical Work up   Patient Goals and CMS Choice Patient states their goals for this hospitalization and ongoing recovery are:: return home   Choice offered to / list presented to : [place] ownership interest in [facility].provided to::  (n/a)    Expected Discharge Plan and Services In-house Referral: Clinical Social Work   Post Acute Care Choice: [place] arrangements for the past 2 months: [place]: PT, OT [place] Agency: [place] Date [place]: 05/26/22 Time [place]: 1131 Representative spoke with at [place]: [place] Arrangements/Services Living arrangements for the past 2 months: [place] with:: Spouse, Adult Children Patient language and need for interpreter reviewed:: Yes Do you feel safe going back to the place where you live?: Yes      Need for Family Participation in Patient Care: Yes (Comment) Care giver support system in place?: Yes (comment)   Criminal Activity/Legal Involvement Pertinent to Current  Situation/Hospitalization: No - Comment as needed  Activities of Daily Living      Permission Sought/Granted                  Emotional Assessment       Orientation: : Oriented to Self, Oriented to Place Alcohol / Substance Use: Not Applicable Psych Involvement: No (comment)  Admission diagnosis:  Altered mental status [R41.82] Altered mental status, unspecified altered mental status type [R41.82] Patient Active Problem List   Diagnosis Date Noted   Altered mental status 05/26/2022   Hypertensive urgency 05/26/2022   Mixed hyperlipidemia 05/26/2022   Type 2 diabetes mellitus with hyperglycemia ([place]) 05/26/2022   History of stroke 05/26/2022   Colles' fracture of left radius, initial encounter for closed fracture 05/18/2021   Subtherapeutic international normalized ratio (INR)    Acute ischemic right MCA stroke ([place]) 05/18/2018   Chronic pain syndrome    Diabetes mellitus type 2 in nonobese [facility])    Sleep disturbance    NSTEMI (non-ST elevated myocardial infarction) ([place]) 09/25/2013   Coronary atherosclerosis of native coronary artery 09/25/2013   Essential hypertension 09/25/2013   Diabetes mellitus ([place]) 09/25/2013   Tobacco abuse 09/25/2013   PCP:  Monico Blitz, MD Pharmacy:   [facility] [address] [place] [address] [place] [address] Phone: [phone] Fax: [phone]     Social Determinants of Health (SDOH) Social History: SDOH Screenings  Food Insecurity: No Food Insecurity (05/18/2018)  Depression (PHQ2-9): Low Risk  (06/06/2018)  Tobacco Use: High Risk (05/25/2022)   SDOH Interventions:     Readmission Risk Interventions     No data to display

## 2022-05-26 NOTE — Plan of Care (Signed)
  Problem: Acute Rehab OT Goals (only OT should resolve) Goal: Pt. Will Perform Grooming Flowsheets (Taken 05/26/2022 1000) Pt Will Perform Grooming:  with modified independence  standing Goal: Pt. Will Transfer To Toilet Flowsheets (Taken 05/26/2022 1000) Pt Will Transfer to Toilet:  with modified independence  ambulating  Terica Yogi OT, MOT

## 2022-05-27 DIAGNOSIS — I160 Hypertensive urgency: Secondary | ICD-10-CM | POA: Diagnosis not present

## 2022-05-27 DIAGNOSIS — E1165 Type 2 diabetes mellitus with hyperglycemia: Secondary | ICD-10-CM | POA: Diagnosis not present

## 2022-05-27 DIAGNOSIS — I10 Essential (primary) hypertension: Secondary | ICD-10-CM | POA: Diagnosis not present

## 2022-05-27 DIAGNOSIS — R4182 Altered mental status, unspecified: Secondary | ICD-10-CM | POA: Diagnosis not present

## 2022-05-27 DIAGNOSIS — Z8673 Personal history of transient ischemic attack (TIA), and cerebral infarction without residual deficits: Secondary | ICD-10-CM | POA: Diagnosis not present

## 2022-05-27 LAB — GLUCOSE, CAPILLARY
Glucose-Capillary: 116 mg/dL — ABNORMAL HIGH (ref 70–99)
Glucose-Capillary: 95 mg/dL (ref 70–99)
Glucose-Capillary: 109 mg/dL — ABNORMAL HIGH (ref 70–99)

## 2022-05-27 LAB — URINE CULTURE: Culture: NO GROWTH

## 2022-05-27 LAB — BASIC METABOLIC PANEL
Anion gap: 8 (ref 5–15)
BUN: 22 mg/dL (ref 8–23)
CO2: 23 mmol/L (ref 22–32)
Calcium: 8.6 mg/dL — ABNORMAL LOW (ref 8.9–10.3)
Chloride: 105 mmol/L (ref 98–111)
Creatinine, Ser: 1.1 mg/dL — ABNORMAL HIGH (ref 0.44–1.00)
GFR, Estimated: 53 mL/min — ABNORMAL LOW (ref >60)
Glucose, Bld: 108 mg/dL — ABNORMAL HIGH (ref 70–99)
Potassium: 3.7 mmol/L (ref 3.5–5.1)
Sodium: 136 mmol/L (ref 135–145)

## 2022-05-27 LAB — CBC
HCT: 41.4 % (ref 36.0–46.0)
Hemoglobin: 14.1 g/dL (ref 12.0–15.0)
MCH: 31.5 pg (ref 26.0–34.0)
MCHC: 34.1 g/dL (ref 30.0–36.0)
MCV: 92.6 fL (ref 80.0–100.0)
Platelets: 262 10*3/uL (ref 150–400)
RBC: 4.47 MIL/uL (ref 3.87–5.11)
RDW: 13.9 % (ref 11.5–15.5)
WBC: 11.7 10*3/uL — ABNORMAL HIGH (ref 4.0–10.5)
nRBC: 0 % (ref 0.0–0.2)

## 2022-05-27 LAB — PROTIME-INR
INR: 1.6 — ABNORMAL HIGH (ref 0.8–1.2)
Prothrombin Time: 18.5 s — ABNORMAL HIGH (ref 11.4–15.2)

## 2022-05-27 LAB — MAGNESIUM: Magnesium: 1.7 mg/dL (ref 1.7–2.4)

## 2022-05-27 MED ORDER — WARFARIN SODIUM 5 MG PO TABS: 5.0000 mg | ORAL_TABLET | Freq: Once | ORAL | Status: DC

## 2022-05-27 NOTE — Progress Notes (Signed)
ANTICOAGULATION CONSULT NOTE -   Pharmacy Consult for Warfarin Indication: atrial fibrillation  Allergies  Allergen Reactions   Codeine Itching    Pt says she can take    Other Anaphylaxis    NUTS   LOBSTER - causes itching      Penicillins Rash    DID THE REACTION INVOLVE: Swelling of the face/tongue/throat, SOB, or low BP? Unknown Sudden or severe rash/hives, skin peeling, or the inside of the mouth or nose? Unknown Did it require medical treatment? Unknown When did it last happen?      unk If all above answers are "NO", may proceed with cephalosporin use.    Sulfa Antibiotics Rash   Lisinopril Cough   Lorazepam Other (See Comments)    Confusion  Hyper  Other Reaction(s): Confusion  Other reaction(s): Confusion  Hyper  Other reaction(s): Confusion Hyper    Hyper    Patient Measurements:    Vital Signs: Temp: 97.6 F (36.4 C) (01/17 0525) Temp Source: Oral (01/17 0525) BP: 149/85 (01/17 0525) Pulse Rate: 60 (01/17 0525)  Labs: Recent Labs    05/25/22 2026 05/27/22 0006  HGB 16.8* 14.1  HCT 49.9* 41.4  PLT 302 262  APTT 30  --   LABPROT 17.7* 18.5*  INR 1.5* 1.6*  CREATININE 1.13* 1.10*     CrCl cannot be calculated (Unknown ideal weight.).   Medical History: Past Medical History:  Diagnosis Date   Anginal pain ([place]) 09/25/2013   Arthritis    CAD (coronary artery disease)    a. 09/25/13 s/p DES to mLCx and DES to pRCA.   Chronic left-sided back pain    has had ESI/Dr. Francesco Runner   DM (diabetes mellitus) ([place])    HTN (hypertension)    Myocardial infarction ([place]) 09/2013   nstemi   Stroke ([place]) 05/11/2018   Tobacco abuse     Medications:  Medications Prior to Admission  Medication Sig Dispense Refill Last Dose   acetaminophen (TYLENOL) 325 MG tablet Take 1-2 tablets (325-650 mg total) by mouth every 4 (four) hours as needed for mild pain.   unk   atorvastatin (LIPITOR) 80 MG tablet Take 80 mg by mouth daily.   Past Week   azelastine  (ASTELIN) 0.1 % nasal spray Place 2 sprays into both nostrils 2 (two) times daily as needed for rhinitis or allergies. Use in each nostril as directed   unk   calcium-vitamin D (OSCAL WITH D) 500-200 MG-UNIT tablet Take 1 tablet by mouth daily with breakfast.   05/25/2022   cholecalciferol (VITAMIN D3) 25 MCG (1000 UNIT) tablet Take 1,000 Units by mouth daily.   05/25/2022   cyclobenzaprine (FLEXERIL) 10 MG tablet Take 10 mg by mouth 3 (three) times daily as needed for muscle spasms.   unk   escitalopram (LEXAPRO) 10 MG tablet Take 10 mg by mouth daily.   05/25/2022   gabapentin (NEURONTIN) 300 MG capsule Take 300 mg by mouth 3 (three) times daily.   05/25/2022   metFORMIN (GLUCOPHAGE) 500 MG tablet Take 500 mg by mouth 2 (two) times daily.   05/25/2022   metoprolol tartrate (LOPRESSOR) 25 MG tablet Take 1 tablet (25 mg total) by mouth 2 (two) times daily. 60 tablet 0 05/25/2022 at am   nitroGLYCERIN (NITROSTAT) 0.4 MG SL tablet PLACE 1 TABLET UNDER THE TONGUE EVERY 5 MINUTES FOR 3 DOSES AS NEEDED CHEST PAIN 25 tablet 0 unk   warfarin (COUMADIN) 5 MG tablet Take 1 tablet (5 mg total) by mouth daily  at 6 PM. 30 tablet 0 05/25/2022 at 1800   oxyCODONE-acetaminophen (PERCOCET/ROXICET) 5-325 MG tablet Take 1 tablet by mouth every 8 (eight) hours as needed for severe pain. (Patient not taking: Reported on 05/26/2022)   Not Taking    Assessment: 72 y.o. female with medical history significant of hypertension, hyperlipidemia, CAD s/p stent placement, T2DM, prior stroke with residual left-sided weakness who presents to the emergency department a due to altered mental status. Patient is on chronic anticoagulation for afib with warfarin. Pharmacy asked to dose.  INR 1.5> 1.6 , subtherapeutic Coumadin had been on hold earlier this month per husband, but had restarted over the last week so will continue home dose. Home dose is '5mg'$  po daily  Goal of Therapy:  INR 2-3 Monitor platelets by anticoagulation protocol:  Yes   Plan:  Warfarin '5mg'$  po x 1 today Daily PT-INR Monitor for S/S of bleeding  Isac Sarna, BS Pharm D, BCPS Clinical Pharmacist 05/27/2022,9:07 AM

## 2022-05-27 NOTE — TOC Transition Note (Signed)
Transition of Care [facility]) - CM/SW Discharge Note   Patient Details  Name: Rachel Perkins MRN: [id number] Date of Birth: [birth year]  [facility] CM/SW Contact:  Boneta Lucks, RN Phone Number: 05/27/2022, 10:43 AM   Clinical Narrative:   Patient discharging home. [place] updated, orders placed.  Final next level of care: [place] Barriers to Discharge: Barriers Resolved   Patient Goals and CMS Choice   Choice offered to / list presented to : Spouse  Discharge Placement         Discharge Plan and Services Additional resources added to the After Visit Summary for   In-house Referral: Clinical Social Work   Post Acute Care Choice: Home Health                 HH Arranged: PT, OT [facility] Agency: [place] Date [place]: 05/26/22 Time [place]: [id number] Representative spoke with at [place]: [place] Determinants of Health ([place]) Interventions SDOH Screenings   Food Insecurity: No Food Insecurity (05/26/2022)  Housing: Low Risk  (05/26/2022)  Transportation Needs: No Transportation Needs (05/26/2022)  Utilities: Not At Risk (05/26/2022)  Depression (PHQ2-9): Low Risk  (06/06/2018)  Tobacco Use: High Risk (05/25/2022)

## 2022-05-27 NOTE — Discharge Summary (Signed)
Physician Discharge Summary  Rachel Perkins [facility] JSE:[id number] DOB: [birth year] DOA: 05/25/2022  PCP: Monico Blitz, MD  Admit date: 05/25/2022 Discharge date: 05/27/2022  Admitted From:  Home  Disposition: Home with [place]   Recommendations for Outpatient Follow-up:  Follow up with PCP in 1 weeks Follow up with neurology in 6-8 weeks Please monitor PT/INR closely until stabilized Please check blood sugar at least 3 times per day   Home Health:  PT/OT   Discharge Condition: STABLE   CODE STATUS: FULL DIET: resume heart healthy, vitamin K consistent    Brief Hospitalization Summary: Please see all hospital notes, images, labs for full details of the hospitalization. ADMISSION HPI:  72 y.o. female with past medical history significant for essential hypertension, hyperlipidemia, CAD s/p stent, type 2 diabetes mellitus, paroxysmal atrial fibrillation on Coumadin, history of CVA with residual left-sided weakness who presented to [facility], ED on 1/15 with confusion/altered mental status per family.  Husband reports approximately 2 PM on 1/15 he noticed that she was more confused, not following commands appropriately and asking strange questions.  Patient was reportedly slurring her words and difficult to understand.  She also reported weakness to the right side, dropping items and unable to open a jar (which is new from baseline).  Patient is on Coumadin, but medication was on hold earlier this month per husband, but had restarted over the last week.  Husband brought his specimen for further evaluation in the ED.   In the ED, BP 217/59, temperature 98.0 F, HR 60, RR 18, BP 94% on room air.  WBC 12.6, hemoglobin 16.8, platelets 302.  Sodium 135, potassium 4.3, chloride 100, CO2 23, glucose 152, BUN 15, creatinine 1.13.  AST 22, ALT 27, total bilirubin 0.9.  Cova-19/influenza A/B/RSV PCR negative.  Urinalysis unrevealing.  EtOH level less than 10.  UDS negative.  CT head without contrast with chronic ischemic  changes but no acute intracranial process.  CT angiogram head/neck with age-indeterminate occlusion of the left vertebral artery V1 and proximal V2 segments, no intracranial arterial occlusion or hemodynamically significant stenosis, bilateral carotid bifurcation atherosclerosis without stenosis or occlusion.  Chest x-ray with no active cardiopulmonary disease process.  CT abdomen/pelvis with no acute findings in the abdomen/pelvis.  TRH was consulted for admission for further evaluation management of concern for TIA versus CVA.  HOSPITAL COURSE BY PROBLEM   Expressive aphasia likely secondary to TIA Hx CVA Patient presenting to ED with confusion, not following commands appropriately, slurred speech and right-sided weakness.  History of CVA and A-fib currently on Coumadin.  Patient is afebrile without leukocytosis.  Urinalysis unrevealing.  UDS, EtOH level negative. CT head without contrast with chronic ischemic changes but no acute intracranial process.  CT angiogram head/neck with age-indeterminate occlusion of the left vertebral artery V1 and proximal V2 segments, no intracranial arterial occlusion or hemodynamically significant stenosis, bilateral carotid bifurcation atherosclerosis without stenosis or occlusion.  MR brain without contrast with no evidence of acute intracranial normality, chronic cortically based infarcts within the bilateral cerebral hemispheres, chronic lacunar infarct within the right basal ganglia/internal capsule, chronic small vessel ischemic changes and tiny chronic infarct within the left cerebellar hemisphere.  TTE with LVEF 70 to 07%, grade 1 diastolic dysfunction, IVC normal, no LV regional wall motion normalities, no intra-atrial shunt identified.  LDL 45, hemoglobin A1c 6.9. -- OT recommends home health -- PT consult: home health  -- Continue warfarin, pharmacy consulted for dosing/monitoring -- Atorvastatin 80 mg p.o. daily -- Continue monitor on telemetry  Paroxysmal  atrial fibrillation on anticoagulation Subtherapeutic INR Essential hypertension -- Metoprolol tartrate 25 g p.o. twice daily -- Continue warfarin, pharmacy consulted for dosing/monitoring (goal INR 2-3) -- INR daily in hospital, pt has appt for INR test with PCP on 05/28/22 for ongoing mgmt of PT/INR.    Hyperlipidemia LDL 45, at goal. -- Continue atorvastatin 80 mg p.o. daily   Type 2 diabetes mellitus Hemoglobin A1c 6.9, well-controlled.  Home regimen includes metformin 500 mg p.o. twice daily. -- Held oral hypoglycemics while inpatient -- SSI for coverage in hospital  -- CBG before every meal/at bedtime  CBG (last 3)  Recent Labs    05/26/22 2337 05/27/22 0340 05/27/22 0742  GLUCAP 110* 116* 95    Peripheral neuropathy -- Gabapentin 300 mg p.o. 3 times daily   Depression -- Lexapro 10 mg p.o. daily    Discharge Diagnoses:  Principal Problem:   Altered mental status Active Problems:   Coronary atherosclerosis of native coronary artery   Essential hypertension   Hypertensive urgency   Mixed hyperlipidemia   Type 2 diabetes mellitus with hyperglycemia ([name])   History of stroke   Discharge Instructions: Discharge Instructions     Ambulatory referral to Neurology   Complete by: As directed    An appointment is requested in approximately: 8 weeks      Allergies as of 05/27/2022       Reactions   Codeine Itching   Pt says she can take    Other Anaphylaxis   NUTS  LOBSTER - causes itching    Penicillins Rash   DID THE REACTION INVOLVE: Swelling of the face/tongue/throat, SOB, or low BP? Unknown Sudden or severe rash/hives, skin peeling, or the inside of the mouth or nose? Unknown Did it require medical treatment? Unknown When did it last happen?      unk If all above answers are "NO", may proceed with cephalosporin use.   Sulfa Antibiotics Rash   Lisinopril Cough   Lorazepam Other (See Comments)   Confusion Hyper Other Reaction(s): Confusion Other  reaction(s): Confusion  Hyper Other reaction(s): Confusion Hyper    Hyper        Medication List     STOP taking these medications    oxyCODONE-acetaminophen 5-325 MG tablet Commonly known as: PERCOCET/ROXICET       TAKE these medications    acetaminophen 325 MG tablet Commonly known as: TYLENOL Take 1-2 tablets (325-650 mg total) by mouth every 4 (four) hours as needed for mild pain.   atorvastatin 80 MG tablet Commonly known as: LIPITOR Take 80 mg by mouth daily.   azelastine 0.1 % nasal spray Commonly known as: ASTELIN Place 2 sprays into both nostrils 2 (two) times daily as needed for rhinitis or allergies. Use in each nostril as directed   calcium-vitamin D 500-200 MG-UNIT tablet Commonly known as: OSCAL WITH D Take 1 tablet by mouth daily with breakfast.   cholecalciferol 25 MCG (1000 UNIT) tablet Commonly known as: VITAMIN D3 Take 1,000 Units by mouth daily.   cyclobenzaprine 10 MG tablet Commonly known as: FLEXERIL Take 10 mg by mouth 3 (three) times daily as needed for muscle spasms.   escitalopram 10 MG tablet Commonly known as: LEXAPRO Take 10 mg by mouth daily.   gabapentin 300 MG capsule Commonly known as: NEURONTIN Take 300 mg by mouth 3 (three) times daily.   metFORMIN 500 MG tablet Commonly known as: GLUCOPHAGE Take 500 mg by mouth 2 (two) times daily.   metoprolol tartrate  25 MG tablet Commonly known as: LOPRESSOR Take 1 tablet (25 mg total) by mouth 2 (two) times daily.   nitroGLYCERIN 0.4 MG SL tablet Commonly known as: NITROSTAT PLACE 1 TABLET UNDER THE TONGUE EVERY 5 MINUTES FOR 3 DOSES AS NEEDED CHEST PAIN   warfarin 5 MG tablet Commonly known as: COUMADIN Take 1 tablet (5 mg total) by mouth daily at 6 PM.        [facility] Follow up.   Why: Will contact you to schedule home health visits. Contact information: [place] [address] [phone]          Monico Blitz, MD. Schedule an appointment as soon as possible for a visit in 1 week(s).   Specialty: Internal Medicine Why: Hospital Follow Up Contact information: [address]  [place] [address] [phone]         [facility]. Schedule an appointment as soon as possible for a visit in 2 week(s).   Specialty: Neurology Why: Hospital Follow Up Contact information: [address] [place] [phone]               Allergies  Allergen Reactions   Codeine Itching    Pt says she can take    Other Anaphylaxis    NUTS   LOBSTER - causes itching      Penicillins Rash    DID THE REACTION INVOLVE: Swelling of the face/tongue/throat, SOB, or low BP? Unknown Sudden or severe rash/hives, skin peeling, or the inside of the mouth or nose? Unknown Did it require medical treatment? Unknown When did it last happen?      unk If all above answers are "NO", may proceed with cephalosporin use.    Sulfa Antibiotics Rash   Lisinopril Cough   Lorazepam Other (See Comments)    Confusion  Hyper  Other Reaction(s): Confusion  Other reaction(s): Confusion  Hyper  Other reaction(s): Confusion Hyper    Hyper   Allergies as of 05/27/2022       Reactions   Codeine Itching   Pt says she can take    Other Anaphylaxis   NUTS  LOBSTER - causes itching    Penicillins Rash   DID THE REACTION INVOLVE: Swelling of the face/tongue/throat, SOB, or low BP? Unknown Sudden or severe rash/hives, skin peeling, or the inside of the mouth or nose? Unknown Did it require medical treatment? Unknown When did it last happen?      unk If all above answers are "NO", may proceed with cephalosporin use.   Sulfa Antibiotics Rash   Lisinopril Cough   Lorazepam Other (See Comments)   Confusion Hyper Other Reaction(s): Confusion Other reaction(s): Confusion  Hyper Other reaction(s): Confusion Hyper    Hyper        Medication List      STOP taking these medications    oxyCODONE-acetaminophen 5-325 MG tablet Commonly known as: PERCOCET/ROXICET       TAKE these medications    acetaminophen 325 MG tablet Commonly known as: TYLENOL Take 1-2 tablets (325-650 mg total) by mouth every 4 (four) hours as needed for mild pain.   atorvastatin 80 MG tablet Commonly known as: LIPITOR Take 80 mg by mouth daily.   azelastine 0.1 % nasal spray Commonly known as: ASTELIN Place 2 sprays into both nostrils 2 (two) times daily as needed for rhinitis or allergies. Use in each nostril as directed  calcium-vitamin D 500-200 MG-UNIT tablet Commonly known as: OSCAL WITH D Take 1 tablet by mouth daily with breakfast.   cholecalciferol 25 MCG (1000 UNIT) tablet Commonly known as: VITAMIN D3 Take 1,000 Units by mouth daily.   cyclobenzaprine 10 MG tablet Commonly known as: FLEXERIL Take 10 mg by mouth 3 (three) times daily as needed for muscle spasms.   escitalopram 10 MG tablet Commonly known as: LEXAPRO Take 10 mg by mouth daily.   gabapentin 300 MG capsule Commonly known as: NEURONTIN Take 300 mg by mouth 3 (three) times daily.   metFORMIN 500 MG tablet Commonly known as: GLUCOPHAGE Take 500 mg by mouth 2 (two) times daily.   metoprolol tartrate 25 MG tablet Commonly known as: LOPRESSOR Take 1 tablet (25 mg total) by mouth 2 (two) times daily.   nitroGLYCERIN 0.4 MG SL tablet Commonly known as: NITROSTAT PLACE 1 TABLET UNDER THE TONGUE EVERY 5 MINUTES FOR 3 DOSES AS NEEDED CHEST PAIN   warfarin 5 MG tablet Commonly known as: COUMADIN Take 1 tablet (5 mg total) by mouth daily at 6 PM.        Procedures/Studies: ECHOCARDIOGRAM COMPLETE  Result Date: 05/26/2022    ECHOCARDIOGRAM REPORT   Patient Name:   ANAILY ASHBAUGH Date of Exam: 05/26/2022 Medical Rec #:  [id number]      Height:       62.0 in Accession #:    [id number]     Weight:       136.0 lb Date of Birth:  [birth year]      BSA:          1.623 m  Patient Age:    72 years       BP:           136/71 mmHg Patient Gender: F              HR:           74 bpm. Exam Location:  [facility] Procedure: 2D Echo, Cardiac Doppler and Color Doppler Indications:    Stroke  History:        Patient has no prior history of Echocardiogram examinations. CAD                 and Previous Myocardial Infarction, Stroke,                 Signs/Symptoms:Altered Mental Status; Risk Factors:Hypertension,                 Diabetes, Dyslipidemia and Current Smoker.  Sonographer:    Wenda Low Referring Phys: [id number] OLADAPO ADEFESO IMPRESSIONS  1. Left ventricular ejection fraction, by estimation, is 70 to 75%. The left ventricle has hyperdynamic function. The left ventricle has no regional wall motion abnormalities. Left ventricular diastolic parameters are consistent with Grade I diastolic dysfunction (impaired relaxation).  2. Right ventricular systolic function is normal. The right ventricular size is normal. Tricuspid regurgitation signal is inadequate for assessing PA pressure.  3. The mitral valve is normal in structure. No evidence of mitral valve regurgitation. No evidence of mitral stenosis.  4. The aortic valve is tricuspid. Aortic valve regurgitation is not visualized. No aortic stenosis is present.  5. The inferior vena cava is normal in size with greater than 50% respiratory variability, suggesting right atrial pressure of 3 mmHg. FINDINGS  Left Ventricle: Left ventricular ejection fraction, by estimation, is 70 to 75%. The left ventricle has hyperdynamic function. The left ventricle has no regional wall motion  abnormalities. The left ventricular internal cavity size was normal in size. There is no left ventricular hypertrophy. Left ventricular diastolic parameters are consistent with Grade I diastolic dysfunction (impaired relaxation). Normal left ventricular filling pressure. Right Ventricle: The right ventricular size is normal. Right vetricular wall thickness was not  well visualized. Right ventricular systolic function is normal. Tricuspid regurgitation signal is inadequate for assessing PA pressure. Left Atrium: Left atrial size was normal in size. Right Atrium: Right atrial size was normal in size. Pericardium: There is no evidence of pericardial effusion. Mitral Valve: The mitral valve is normal in structure. No evidence of mitral valve regurgitation. No evidence of mitral valve stenosis. MV peak gradient, 4.4 mmHg. The mean mitral valve gradient is 2.0 mmHg. Tricuspid Valve: The tricuspid valve is normal in structure. Tricuspid valve regurgitation is not demonstrated. No evidence of tricuspid stenosis. Aortic Valve: The aortic valve is tricuspid. Aortic valve regurgitation is not visualized. No aortic stenosis is present. Aortic valve mean gradient measures 3.0 mmHg. Aortic valve peak gradient measures 5.6 mmHg. Aortic valve area, by VTI measures 2.48 cm. Pulmonic Valve: The pulmonic valve was not well visualized. Pulmonic valve regurgitation is not visualized. No evidence of pulmonic stenosis. Aorta: The aortic root is normal in size and structure. Venous: The inferior vena cava is normal in size with greater than 50% respiratory variability, suggesting right atrial pressure of 3 mmHg. IAS/Shunts: No atrial level shunt detected by color flow Doppler.  LEFT VENTRICLE PLAX 2D LVIDd:         4.30 cm   Diastology LVIDs:         2.40 cm   LV e' medial:    6.85 cm/s LV PW:         0.90 cm   LV E/e' medial:  9.6 LV IVS:        1.00 cm   LV e' lateral:   7.72 cm/s LVOT diam:     1.90 cm   LV E/e' lateral: 8.5 LV SV:         65 LV SV Index:   40 LVOT Area:     2.84 cm  RIGHT VENTRICLE RV Basal diam:  2.15 cm RV Mid diam:    2.00 cm RV S prime:     11.00 cm/s TAPSE (M-mode): 2.0 cm LEFT ATRIUM             Index        RIGHT ATRIUM          Index LA diam:        3.30 cm 2.03 cm/m   RA Area:     9.05 cm LA Vol (A2C):   30.3 ml 18.67 ml/m  RA Volume:   17.30 ml 10.66 ml/m LA Vol  (A4C):   31.6 ml 19.47 ml/m LA Biplane Vol: 32.0 ml 19.72 ml/m  AORTIC VALVE                    PULMONIC VALVE AV Area (Vmax):    2.74 cm     PV Vmax:       1.13 m/s AV Area (Vmean):   2.53 cm     PV Peak grad:  5.1 mmHg AV Area (VTI):     2.48 cm AV Vmax:           118.00 cm/s AV Vmean:          80.400 cm/s AV VTI:  0.262 m AV Peak Grad:      5.6 mmHg AV Mean Grad:      3.0 mmHg LVOT Vmax:         114.00 cm/s LVOT Vmean:        71.700 cm/s LVOT VTI:          0.229 m LVOT/AV VTI ratio: 0.87  AORTA Ao Root diam: 2.80 cm MITRAL VALVE MV Area (PHT): 2.38 cm    SHUNTS MV Area VTI:   2.39 cm    Systemic VTI:  0.23 m MV Peak grad:  4.4 mmHg    Systemic Diam: 1.90 cm MV Mean grad:  2.0 mmHg MV Vmax:       1.05 m/s MV Vmean:      60.5 cm/s MV Decel Time: 319 msec MV E velocity: 65.60 cm/s MV A velocity: 96.40 cm/s MV E/A ratio:  0.68 Carlyle Dolly MD Electronically signed by Carlyle Dolly MD Signature Date/Time: 05/26/2022/1:03:37 PM    Final    MR BRAIN WO CONTRAST  Result Date: 05/26/2022 CLINICAL DATA:  Provided history: Neuro deficit, acute, stroke suspected. Altered mental status. Confusion. History of stroke. EXAM: MRI HEAD WITHOUT CONTRAST TECHNIQUE: Multiplanar, multiecho pulse sequences of the brain and surrounding structures were obtained without intravenous contrast. COMPARISON:  Noncontrast head CT 05/25/2022. CT angiogram head/neck 05/26/2022. FINDINGS: Intermittently motion degraded examination. Most notably, the axial T2 FLAIR sequence is severely motion degraded, the axial T1 weighted sequence is moderate to severely motion degraded and the coronal T2 sequence is moderately motion degraded. Within this limitation, findings are as follows. Brain: No age advanced or lobar predominant parenchymal atrophy. Redemonstrated chronic cortical/subcortical right MCA territory infarct within the right insula, right frontoparietal operculum and right temporoparietal junction. Redemonstrated  chronic cortically-based infarcts within the mid left frontal lobe and left parietal lobe (left MCA and left PCA vascular territories, respectively). Background moderate multifocal T2 FLAIR hyperintense signal abnormality within the cerebral white matter, nonspecific but compatible with chronic small vessel ischemic disease. Chronic infarct within the right basal ganglia/anterior limb of internal capsule. Tiny chronic infarct within the left cerebellar hemisphere. There is no acute infarct. No evidence of an intracranial mass. No chronic intracranial blood products. No extra-axial fluid collection. No midline shift. Vascular: Maintained flow voids within the proximal large arterial vessels. Skull and upper cervical spine: No focal suspicious marrow lesion. Sinuses/Orbits: No mass or acute finding within the imaged orbits. Prior bilateral ocular lens replacement. No significant paranasal sinus disease. Other: Trace fluid within the left mastoid air cells. IMPRESSION: 1. Intermittently motion degraded examination. 2. No evidence of acute intracranial abnormality. The diffusion-weighted imaging is of good quality, and there is no evidence of an acute infarct. 3. Chronic cortically-based infarcts within the bilateral cerebral hemispheres, as described. 4. Chronic lacunar infarct within the right basal ganglia/internal capsule. 5. Background moderate chronic small vessel ischemic changes within the cerebral white matter. 6. Tiny chronic infarct within the left cerebellar hemisphere. Electronically Signed   By: Kellie Simmering D.O.   On: 05/26/2022 09:11   CT ANGIO HEAD NECK W WO CM  Result Date: 05/26/2022 CLINICAL DATA:  Stroke/TIA.  Altered mental status. EXAM: CT ANGIOGRAPHY HEAD AND NECK TECHNIQUE: Multidetector CT imaging of the head and neck was performed using the standard protocol during bolus administration of intravenous contrast. Multiplanar CT image reconstructions and MIPs were obtained to evaluate the  vascular anatomy. Carotid stenosis measurements (when applicable) are obtained utilizing NASCET criteria, using the distal internal carotid diameter as  the denominator. RADIATION DOSE REDUCTION: This exam was performed according to the departmental dose-optimization program which includes automated exposure control, adjustment of the mA and/or kV according to patient size and/or use of iterative reconstruction technique. CONTRAST:  52m OMNIPAQUE IOHEXOL 350 MG/ML SOLN COMPARISON:  05/11/2018 CTA HEAD NECK report (images not available) FINDINGS: CTA NECK FINDINGS Aortic arch: Normal aortic arch caliber. There is an independent origin of the left vertebral artery. Right carotid system: Minimal atherosclerotic calcification at the bifurcation without stenosis or occlusion. Left carotid system: Moderate atherosclerotic calcification at the bifurcation and proximal ICA without stenosis or occlusion. Vertebral arteries: Right dominant configuration. The left vertebral artery V1 and proximal V2 segments are occluded. The right vertebral artery is normal to the skull base. Skeleton: C5-7 ACDF.  No acute abnormality. Other neck: Negative. Upper chest: Negative. Review of the MIP images confirms the above findings CTA HEAD FINDINGS Anterior circulation: There is atherosclerotic calcification of the internal carotid arteries at the skull base without hemodynamically significant stenosis. Normal anterior and middle cerebral arteries. Posterior circulation: No significant stenosis, proximal occlusion, aneurysm, or vascular malformation. Venous sinuses: As permitted by contrast timing, patent. Anatomic variants: None Review of the MIP images confirms the above findings IMPRESSION: 1. Age-indeterminate occlusion of the left vertebral artery V1 and proximal V2 segments. This is new since 05/11/18. 2. No intracranial arterial occlusion or hemodynamically significant stenosis. 3. Bilateral carotid bifurcation atherosclerosis without  stenosis or occlusion. Electronically Signed   By: KUlyses JarredM.D.   On: 05/26/2022 01:32   DG Chest Portable 1 View  Result Date: 05/25/2022 CLINICAL DATA:  Altered mental status EXAM: PORTABLE CHEST 1 VIEW COMPARISON:  None Available. FINDINGS: The heart size and mediastinal contours are within normal limits. Both lungs are clear. The visualized skeletal structures are unremarkable. IMPRESSION: No active disease. Electronically Signed   By: KUlyses JarredM.D.   On: 05/25/2022 23:07   CT ABDOMEN PELVIS W CONTRAST  Result Date: 05/25/2022 CLINICAL DATA:  Bowel obstruction suspected.  Vomiting. EXAM: CT ABDOMEN AND PELVIS WITH CONTRAST TECHNIQUE: Multidetector CT imaging of the abdomen and pelvis was performed using the standard protocol following bolus administration of intravenous contrast. RADIATION DOSE REDUCTION: This exam was performed according to the departmental dose-optimization program which includes automated exposure control, adjustment of the mA and/or kV according to patient size and/or use of iterative reconstruction technique. CONTRAST:  741mOMNIPAQUE IOHEXOL 300 MG/ML  SOLN COMPARISON:  None Available. FINDINGS: Lower chest: No acute findings Hepatobiliary: No focal hepatic abnormality. Gallbladder unremarkable. Pancreas: No focal abnormality or ductal dilatation. Spleen: No focal abnormality.  Normal size. Adrenals/Urinary Tract: Small bilateral adrenal nodules, 12 mm on the right in 10 mm on the left. No renal stones or hydronephrosis. Urinary bladder unremarkable. Stomach/Bowel: Sigmoid diverticulosis. No active diverticulitis. Stomach and small bowel decompressed, unremarkable. Vascular/Lymphatic: Aortic atherosclerosis. No evidence of aneurysm or adenopathy. Reproductive: Prior hysterectomy.  No adnexal masses. Other: No free fluid or free air. Musculoskeletal: No acute bony abnormality. IMPRESSION: No acute findings in the abdomen or pelvis. Sigmoid diverticulosis. Bilateral  adrenal nodules, likely adenomas. Aortic atherosclerosis. Electronically Signed   By: KeRolm Baptise.D.   On: 05/25/2022 22:54   CT HEAD WO CONTRAST  Result Date: 05/25/2022 CLINICAL DATA:  Altered level of consciousness, neurologic deficit EXAM: CT HEAD WITHOUT CONTRAST TECHNIQUE: Contiguous axial images were obtained from the base of the skull through the vertex without intravenous contrast. RADIATION DOSE REDUCTION: This exam was performed according to the departmental dose-optimization  program which includes automated exposure control, adjustment of the mA and/or kV according to patient size and/or use of iterative reconstruction technique. COMPARISON:  05/11/2018 report only, images unavailable FINDINGS: Brain: Encephalomalacia is seen within the right MCA territory and within a small region of the left parietal cortex, consistent with chronic infarcts. Hypodensities throughout the periventricular and subcortical white matter are most consistent with chronic small vessel ischemic change. No evidence of acute infarct or hemorrhage. Lateral ventricles and midline structures are unremarkable. No acute extra-axial fluid collections. No mass effect. Vascular: Atherosclerosis of the internal carotid arteries. No hyperdense vessel. Skull: Normal. Negative for fracture or focal lesion. Sinuses/Orbits: No acute finding. Other: None. IMPRESSION: 1. Chronic ischemic changes as above. No acute intracranial process. Electronically Signed   By: Randa Ngo M.D.   On: 05/25/2022 21:00     Subjective: Pt reports she feels back to baseline, feels well, no difficulty with speech.   Discharge Exam: Vitals:   05/26/22 2052 05/27/22 0525  BP: 119/73 (!) 149/85  Pulse: 65 60  Resp: 20 20  Temp: 98.6 F (37 C) 97.6 F (36.4 C)  SpO2: 99% 99%   Vitals:   05/26/22 1330 05/26/22 1832 05/26/22 2052 05/27/22 0525  BP: 116/74 108/64 119/73 (!) 149/85  Pulse: 89 70 65 60  Resp:   20 20  Temp: 98.4 F (36.9 C)  (!) 97.4 F (36.3 C) 98.6 F (37 C) 97.6 F (36.4 C)  TempSrc: Oral Oral Oral Oral  SpO2: 97% 99% 99% 99%   General: Pt is alert, awake, not in acute distress Cardiovascular: normal S1/S2 +, no rubs, no gallops Respiratory: CTA bilaterally, no wheezing, no rhonchi Abdominal: Soft, NT, ND, bowel sounds + Extremities: no edema, no cyanosis   The results of significant diagnostics from this hospitalization (including imaging, microbiology, ancillary and laboratory) are listed below for reference.     Microbiology: Recent Results (from the past 240 hour(s))  Urine Culture     Status: None   Collection Time: 05/25/22  9:14 PM   Specimen: In/Out Cath Urine  Result Value Ref Range Status   Specimen Description   Final    IN/OUT CATH URINE Performed at [facility], [address], [place], [place] [address]    Special Requests   Final    NONE Performed at [facility], [address] [place], [place] [address]    Culture   Final    NO GROWTH Performed at [facility], [place] [address], [place], [place] [address]    Report Status 05/27/2022 FINAL  Final  Resp panel by RT-PCR (RSV, Flu A&B, Covid) Anterior Nasal Swab     Status: None   Collection Time: 05/25/22 10:07 PM   Specimen: Anterior Nasal Swab  Result Value Ref Range Status   SARS Coronavirus 2 by RT PCR NEGATIVE NEGATIVE Final    Comment: (NOTE) SARS-CoV-2 target nucleic acids are NOT DETECTED.  The SARS-CoV-2 RNA is generally detectable in upper respiratory specimens during the acute phase of infection. The lowest concentration of SARS-CoV-2 viral copies this assay can detect is 138 copies/mL. A negative result does not preclude SARS-Cov-2 infection and should not be used as the sole basis for treatment or other patient management decisions. A negative result may occur with  improper specimen collection/handling, submission of specimen other than nasopharyngeal swab, presence of viral mutation(s) within  the areas targeted by this assay, and inadequate number of viral copies(<138 copies/mL). A negative result must be combined with  clinical observations, patient history, and epidemiological information. The expected result is Negative.  Fact Sheet for Patients:  EntrepreneurPulse.com.au  Fact Sheet for Healthcare Providers:  IncredibleEmployment.be  This test is no t yet approved or cleared by the [place] FDA and  has been authorized for detection and/or diagnosis of SARS-CoV-2 by FDA under an Emergency Use Authorization (EUA). This EUA will remain  in effect (meaning this test can be used) for the duration of the COVID-19 declaration under Section 564(b)(1) of the Act, 21 U.S.C.section 360bbb-3(b)(1), unless the authorization is terminated  or revoked sooner.       Influenza A by PCR NEGATIVE NEGATIVE Final   Influenza B by PCR NEGATIVE NEGATIVE Final    Comment: (NOTE) The Xpert Xpress SARS-CoV-2/FLU/RSV plus assay is intended as an aid in the diagnosis of influenza from Nasopharyngeal swab specimens and should not be used as a sole basis for treatment. Nasal washings and aspirates are unacceptable for Xpert Xpress SARS-CoV-2/FLU/RSV testing.  Fact Sheet for Patients: EntrepreneurPulse.com.au  Fact Sheet for Healthcare Providers: IncredibleEmployment.be  This test is not yet approved or cleared by the [place] FDA and has been authorized for detection and/or diagnosis of SARS-CoV-2 by FDA under an Emergency Use Authorization (EUA). This EUA will remain in effect (meaning this test can be used) for the duration of the COVID-19 declaration under Section 564(b)(1) of the Act, 21 U.S.C. section 360bbb-3(b)(1), unless the authorization is terminated or revoked.     Resp Syncytial Virus by PCR NEGATIVE NEGATIVE Final    Comment: (NOTE) Fact Sheet for  Patients: EntrepreneurPulse.com.au  Fact Sheet for Healthcare Providers: IncredibleEmployment.be  This test is not yet approved or cleared by the [place] FDA and has been authorized for detection and/or diagnosis of SARS-CoV-2 by FDA under an Emergency Use Authorization (EUA). This EUA will remain in effect (meaning this test can be used) for the duration of the COVID-19 declaration under Section 564(b)(1) of the Act, 21 U.S.C. section 360bbb-3(b)(1), unless the authorization is terminated or revoked.  Performed at [facility], [address], [place], [place] [address]      Labs: BNP (last 3 results) No results for input(s): "BNP" in the last 8760 hours. Basic Metabolic Panel: Recent Labs  Lab 05/25/22 2026 05/27/22 0006  NA 135 136  K 4.3 3.7  CL 100 105  CO2 23 23  GLUCOSE 152* 108*  BUN 15 22  CREATININE 1.13* 1.10*  CALCIUM 9.9 8.6*  MG  --  1.7   Liver Function Tests: Recent Labs  Lab 05/25/22 2026  AST 22  ALT 27  ALKPHOS 95  BILITOT 0.9  PROT 8.2*  ALBUMIN 4.7   No results for input(s): "LIPASE", "AMYLASE" in the last 168 hours. No results for input(s): "AMMONIA" in the last 168 hours. CBC: Recent Labs  Lab 05/25/22 2026 05/27/22 0006  WBC 12.6* 11.7*  NEUTROABS 9.4*  --   HGB 16.8* 14.1  HCT 49.9* 41.4  MCV 93.6 92.6  PLT 302 262   Cardiac Enzymes: No results for input(s): "CKTOTAL", "CKMB", "CKMBINDEX", "TROPONINI" in the last 168 hours. BNP: Invalid input(s): "POCBNP" CBG: Recent Labs  Lab 05/26/22 1622 05/26/22 1943 05/26/22 2337 05/27/22 0340 05/27/22 0742  GLUCAP 146* 134* 110* 116* 95   D-Dimer No results for input(s): "DDIMER" in the last 72 hours. Hgb A1c Recent Labs    05/26/22 0500  HGBA1C 6.9*   Lipid Profile Recent Labs    05/26/22 0500  CHOL 105  HDL 45  LDLCALC 48  TRIG 58  CHOLHDL 2.3   Thyroid function studies No results for input(s): "TSH", "T4TOTAL",  "T3FREE", "THYROIDAB" in the last 72 hours.  Invalid input(s): "FREET3" Anemia work up No results for input(s): "VITAMINB12", "FOLATE", "FERRITIN", "TIBC", "IRON", "RETICCTPCT" in the last 72 hours. Urinalysis    Component Value Date/Time   COLORURINE YELLOW 05/25/2022 2114   APPEARANCEUR CLEAR 05/25/2022 2114   LABSPEC 1.016 05/25/2022 2114   PHURINE 5.0 05/25/2022 2114   GLUCOSEU NEGATIVE 05/25/2022 2114   HGBUR NEGATIVE 05/25/2022 2114   [place] NEGATIVE 05/25/2022 2114   [place] NEGATIVE 05/25/2022 2114   PROTEINUR 100 (A) 05/25/2022 2114   NITRITE NEGATIVE 05/25/2022 2114   LEUKOCYTESUR NEGATIVE 05/25/2022 2114   Sepsis Labs Recent Labs  Lab 05/25/22 2026 05/27/22 0006  WBC 12.6* 11.7*   Microbiology Recent Results (from the past 240 hour(s))  Urine Culture     Status: None   Collection Time: 05/25/22  9:14 PM   Specimen: In/Out Cath Urine  Result Value Ref Range Status   Specimen Description   Final    IN/OUT CATH URINE Performed at [facility], [address], [place], [place] [address]    Special Requests   Final    NONE Performed at [facility], [address], [place], [place] [address]    Culture   Final    NO GROWTH Performed at [facility], [place] [address] [place], [place] [address]    Report Status 05/27/2022 FINAL  Final  Resp panel by RT-PCR (RSV, Flu A&B, Covid) Anterior Nasal Swab     Status: None   Collection Time: 05/25/22 10:07 PM   Specimen: Anterior Nasal Swab  Result Value Ref Range Status   SARS Coronavirus 2 by RT PCR NEGATIVE NEGATIVE Final    Comment: (NOTE) SARS-CoV-2 target nucleic acids are NOT DETECTED.  The SARS-CoV-2 RNA is generally detectable in upper respiratory specimens during the acute phase of infection. The lowest concentration of SARS-CoV-2 viral copies this assay can detect is 138 copies/mL. A negative result does not preclude SARS-Cov-2 infection and should not be used as the sole basis for treatment  or other patient management decisions. A negative result may occur with  improper specimen collection/handling, submission of specimen other than nasopharyngeal swab, presence of viral mutation(s) within the areas targeted by this assay, and inadequate number of viral copies(<138 copies/mL). A negative result must be combined with clinical observations, patient history, and epidemiological information. The expected result is Negative.  Fact Sheet for Patients:  EntrepreneurPulse.com.au  Fact Sheet for Healthcare Providers:  IncredibleEmployment.be  This test is no t yet approved or cleared by the [place] FDA and  has been authorized for detection and/or diagnosis of SARS-CoV-2 by FDA under an Emergency Use Authorization (EUA). This EUA will remain  in effect (meaning this test can be used) for the duration of the COVID-19 declaration under Section 564(b)(1) of the Act, 21 U.S.C.section 360bbb-3(b)(1), unless the authorization is terminated  or revoked sooner.       Influenza A by PCR NEGATIVE NEGATIVE Final   Influenza B by PCR NEGATIVE NEGATIVE Final    Comment: (NOTE) The Xpert Xpress SARS-CoV-2/FLU/RSV plus assay is intended as an aid in the diagnosis of influenza from Nasopharyngeal swab specimens and should not be used as a sole basis for treatment. Nasal washings and aspirates are unacceptable for Xpert Xpress SARS-CoV-2/FLU/RSV testing.  Fact Sheet for Patients: EntrepreneurPulse.com.au  Fact Sheet for Healthcare Providers: IncredibleEmployment.be  This  test is not yet approved or cleared by the [place] and has been authorized for detection and/or diagnosis of SARS-CoV-2 by FDA under an Emergency Use Authorization (EUA). This EUA will remain in effect (meaning this test can be used) for the duration of the COVID-19 declaration under Section 564(b)(1) of the Act, 21 U.S.C. section  360bbb-3(b)(1), unless the authorization is terminated or revoked.     Resp Syncytial Virus by PCR NEGATIVE NEGATIVE Final    Comment: (NOTE) Fact Sheet for Patients: EntrepreneurPulse.com.au  Fact Sheet for Healthcare Providers: IncredibleEmployment.be  This test is not yet approved or cleared by the [place] FDA and has been authorized for detection and/or diagnosis of SARS-CoV-2 by FDA under an Emergency Use Authorization (EUA). This EUA will remain in effect (meaning this test can be used) for the duration of the COVID-19 declaration under Section 564(b)(1) of the Act, 21 U.S.C. section 360bbb-3(b)(1), unless the authorization is terminated or revoked.  Performed at [facility], [address] [place], [place] [address]    Time coordinating discharge: 37 mins   SIGNED:  Irwin Brakeman, MD  [facility] 05/27/2022, 10:23 AM How to contact the [facility] Attending or Consulting provider [place] or covering provider during after hours [place], for this patient?  Check the care team in [facility] and look for a) attending/consulting [facility] provider listed and b) the [facility] team listed Log into [email or web address] and use [place]'s universal password to access. If you do not have the password, please contact the hospital operator. Locate the [facility] provider you are looking for under [facility] and page to a number that you can be directly reached. If you still have difficulty reaching the provider, please page the [facility] (Director on Call) for the Hospitalists listed on amion for assistance.

## 2022-05-27 NOTE — Plan of Care (Signed)
Discharge instructions, stroke education reviewed and follow-up care reviewed with patient and patients husband. Both verbalized understanding.  Patient discharged home with husband in stable condition.

## 2022-05-27 NOTE — Discharge Instructions (Signed)
IMPORTANT INFORMATION: PAY CLOSE ATTENTION   PHYSICIAN DISCHARGE INSTRUCTIONS  Follow with Primary care provider  Rachel Blitz, MD  and other consultants as instructed by your Hospitalist Physician  [place] IF SYMPTOMS COME BACK, WORSEN OR NEW PROBLEM DEVELOPS   Please note: You were cared for by a hospitalist during your hospital stay. Every effort will be made to forward records to your primary care provider.  You can request that your primary care provider send for your hospital records if they have not received them.  Once you are discharged, your primary care physician will handle any further medical issues. Please note that NO REFILLS for any discharge medications will be authorized once you are discharged, as it is imperative that you return to your primary care physician (or establish a relationship with a primary care physician if you do not have one) for your post hospital discharge needs so that they can reassess your need for medications and monitor your lab values.  Please get a complete blood count and chemistry panel checked by your Primary MD at your next visit, and again as instructed by your Primary MD.  Get Medicines reviewed and adjusted: Please take all your medications with you for your next visit with your Primary MD  Laboratory/radiological data: Please request your Primary MD to go over all hospital tests and procedure/radiological results at the follow up, please ask your primary care provider to get all Hospital records sent to his/her office.  In some cases, they will be blood work, cultures and biopsy results pending at the time of your discharge. Please request that your primary care provider follow up on these results.  If you are diabetic, please bring your blood sugar readings with you to your follow up appointment with primary care.    Please call and make your follow up appointments as soon as possible.    Also Note the  following: If you experience worsening of your admission symptoms, develop shortness of breath, life threatening emergency, suicidal or homicidal thoughts you must seek medical attention immediately by calling 911 or calling your MD immediately  if symptoms less severe.  You must read complete instructions/literature along with all the possible adverse reactions/side effects for all the Medicines you take and that have been prescribed to you. Take any new Medicines after you have completely understood and accpet all the possible adverse reactions/side effects.   Do not drive when taking Pain medications or sleeping medications (Benzodiazepines)  Do not take more than prescribed Pain, Sleep and Anxiety Medications. It is not advisable to combine anxiety,sleep and pain medications without talking with your primary care practitioner  Special Instructions: If you have smoked or chewed Tobacco  in the last 2 yrs please stop smoking, stop any regular Alcohol  and or any Recreational drug use.  Wear Seat belts while driving.  Do not drive if taking any narcotic, mind altering or controlled substances or recreational drugs or alcohol.

## 2022-05-29 DIAGNOSIS — E1165 Type 2 diabetes mellitus with hyperglycemia: Secondary | ICD-10-CM | POA: Diagnosis not present

## 2022-05-29 DIAGNOSIS — F17210 Nicotine dependence, cigarettes, uncomplicated: Secondary | ICD-10-CM | POA: Diagnosis not present

## 2022-05-29 DIAGNOSIS — I69354 Hemiplegia and hemiparesis following cerebral infarction affecting left non-dominant side: Secondary | ICD-10-CM | POA: Diagnosis not present

## 2022-05-29 DIAGNOSIS — Z7901 Long term (current) use of anticoagulants: Secondary | ICD-10-CM | POA: Diagnosis not present

## 2022-05-29 DIAGNOSIS — Z7984 Long term (current) use of oral hypoglycemic drugs: Secondary | ICD-10-CM | POA: Diagnosis not present

## 2022-05-29 DIAGNOSIS — Z7982 Long term (current) use of aspirin: Secondary | ICD-10-CM | POA: Diagnosis not present

## 2022-05-29 DIAGNOSIS — Z9181 History of falling: Secondary | ICD-10-CM | POA: Diagnosis not present

## 2022-05-29 DIAGNOSIS — Z955 Presence of coronary angioplasty implant and graft: Secondary | ICD-10-CM | POA: Diagnosis not present

## 2022-05-29 DIAGNOSIS — I2510 Atherosclerotic heart disease of native coronary artery without angina pectoris: Secondary | ICD-10-CM | POA: Diagnosis not present

## 2022-05-29 DIAGNOSIS — I10 Essential (primary) hypertension: Secondary | ICD-10-CM | POA: Diagnosis not present

## 2022-06-01 DIAGNOSIS — I2510 Atherosclerotic heart disease of native coronary artery without angina pectoris: Secondary | ICD-10-CM | POA: Diagnosis not present

## 2022-06-01 DIAGNOSIS — I10 Essential (primary) hypertension: Secondary | ICD-10-CM | POA: Diagnosis not present

## 2022-06-01 DIAGNOSIS — E1165 Type 2 diabetes mellitus with hyperglycemia: Secondary | ICD-10-CM | POA: Diagnosis not present

## 2022-06-01 DIAGNOSIS — F17210 Nicotine dependence, cigarettes, uncomplicated: Secondary | ICD-10-CM | POA: Diagnosis not present

## 2022-06-01 DIAGNOSIS — Z955 Presence of coronary angioplasty implant and graft: Secondary | ICD-10-CM | POA: Diagnosis not present

## 2022-06-01 DIAGNOSIS — I69354 Hemiplegia and hemiparesis following cerebral infarction affecting left non-dominant side: Secondary | ICD-10-CM | POA: Diagnosis not present

## 2022-06-02 DIAGNOSIS — I2510 Atherosclerotic heart disease of native coronary artery without angina pectoris: Secondary | ICD-10-CM | POA: Diagnosis not present

## 2022-06-02 DIAGNOSIS — Z955 Presence of coronary angioplasty implant and graft: Secondary | ICD-10-CM | POA: Diagnosis not present

## 2022-06-02 DIAGNOSIS — E1122 Type 2 diabetes mellitus with diabetic chronic kidney disease: Secondary | ICD-10-CM | POA: Diagnosis not present

## 2022-06-02 DIAGNOSIS — Z8673 Personal history of transient ischemic attack (TIA), and cerebral infarction without residual deficits: Secondary | ICD-10-CM | POA: Diagnosis not present

## 2022-06-02 DIAGNOSIS — R079 Chest pain, unspecified: Secondary | ICD-10-CM | POA: Diagnosis not present

## 2022-06-02 DIAGNOSIS — I10 Essential (primary) hypertension: Secondary | ICD-10-CM | POA: Diagnosis not present

## 2022-06-02 DIAGNOSIS — E1165 Type 2 diabetes mellitus with hyperglycemia: Secondary | ICD-10-CM | POA: Diagnosis not present

## 2022-06-02 DIAGNOSIS — Z299 Encounter for prophylactic measures, unspecified: Secondary | ICD-10-CM | POA: Diagnosis not present

## 2022-06-02 DIAGNOSIS — I69354 Hemiplegia and hemiparesis following cerebral infarction affecting left non-dominant side: Secondary | ICD-10-CM | POA: Diagnosis not present

## 2022-06-02 DIAGNOSIS — F17210 Nicotine dependence, cigarettes, uncomplicated: Secondary | ICD-10-CM | POA: Diagnosis not present

## 2022-06-03 DIAGNOSIS — I69354 Hemiplegia and hemiparesis following cerebral infarction affecting left non-dominant side: Secondary | ICD-10-CM | POA: Diagnosis not present

## 2022-06-03 DIAGNOSIS — Z955 Presence of coronary angioplasty implant and graft: Secondary | ICD-10-CM | POA: Diagnosis not present

## 2022-06-03 DIAGNOSIS — F17210 Nicotine dependence, cigarettes, uncomplicated: Secondary | ICD-10-CM | POA: Diagnosis not present

## 2022-06-03 DIAGNOSIS — I2510 Atherosclerotic heart disease of native coronary artery without angina pectoris: Secondary | ICD-10-CM | POA: Diagnosis not present

## 2022-06-03 DIAGNOSIS — I10 Essential (primary) hypertension: Secondary | ICD-10-CM | POA: Diagnosis not present

## 2022-06-03 DIAGNOSIS — E1165 Type 2 diabetes mellitus with hyperglycemia: Secondary | ICD-10-CM | POA: Diagnosis not present

## 2022-06-04 DIAGNOSIS — E1165 Type 2 diabetes mellitus with hyperglycemia: Secondary | ICD-10-CM | POA: Diagnosis not present

## 2022-06-04 DIAGNOSIS — I69354 Hemiplegia and hemiparesis following cerebral infarction affecting left non-dominant side: Secondary | ICD-10-CM | POA: Diagnosis not present

## 2022-06-04 DIAGNOSIS — Z955 Presence of coronary angioplasty implant and graft: Secondary | ICD-10-CM | POA: Diagnosis not present

## 2022-06-04 DIAGNOSIS — I2510 Atherosclerotic heart disease of native coronary artery without angina pectoris: Secondary | ICD-10-CM | POA: Diagnosis not present

## 2022-06-04 DIAGNOSIS — I10 Essential (primary) hypertension: Secondary | ICD-10-CM | POA: Diagnosis not present

## 2022-06-04 DIAGNOSIS — F17210 Nicotine dependence, cigarettes, uncomplicated: Secondary | ICD-10-CM | POA: Diagnosis not present

## 2022-06-05 DIAGNOSIS — N183 Chronic kidney disease, stage 3 unspecified: Secondary | ICD-10-CM | POA: Diagnosis not present

## 2022-06-05 DIAGNOSIS — E1165 Type 2 diabetes mellitus with hyperglycemia: Secondary | ICD-10-CM | POA: Diagnosis not present

## 2022-06-05 DIAGNOSIS — J329 Chronic sinusitis, unspecified: Secondary | ICD-10-CM | POA: Diagnosis not present

## 2022-06-05 DIAGNOSIS — I69952 Hemiplegia and hemiparesis following unspecified cerebrovascular disease affecting left dominant side: Secondary | ICD-10-CM | POA: Diagnosis not present

## 2022-06-11 DIAGNOSIS — E1165 Type 2 diabetes mellitus with hyperglycemia: Secondary | ICD-10-CM | POA: Diagnosis not present

## 2022-06-11 DIAGNOSIS — F17210 Nicotine dependence, cigarettes, uncomplicated: Secondary | ICD-10-CM | POA: Diagnosis not present

## 2022-06-11 DIAGNOSIS — I2510 Atherosclerotic heart disease of native coronary artery without angina pectoris: Secondary | ICD-10-CM | POA: Diagnosis not present

## 2022-06-11 DIAGNOSIS — I10 Essential (primary) hypertension: Secondary | ICD-10-CM | POA: Diagnosis not present

## 2022-06-30 DIAGNOSIS — Z299 Encounter for prophylactic measures, unspecified: Secondary | ICD-10-CM | POA: Diagnosis not present

## 2022-06-30 DIAGNOSIS — Z8673 Personal history of transient ischemic attack (TIA), and cerebral infarction without residual deficits: Secondary | ICD-10-CM | POA: Diagnosis not present

## 2022-06-30 DIAGNOSIS — Z6824 Body mass index (BMI) 24.0-24.9, adult: Secondary | ICD-10-CM | POA: Diagnosis not present

## 2022-06-30 DIAGNOSIS — Z87891 Personal history of nicotine dependence: Secondary | ICD-10-CM | POA: Diagnosis not present

## 2022-06-30 DIAGNOSIS — R5383 Other fatigue: Secondary | ICD-10-CM | POA: Diagnosis not present

## 2022-06-30 DIAGNOSIS — I10 Essential (primary) hypertension: Secondary | ICD-10-CM | POA: Diagnosis not present

## 2022-07-07 DIAGNOSIS — M4726 Other spondylosis with radiculopathy, lumbar region: Secondary | ICD-10-CM | POA: Diagnosis not present

## 2022-07-07 DIAGNOSIS — Z1330 Encounter for screening examination for mental health and behavioral disorders, unspecified: Secondary | ICD-10-CM | POA: Diagnosis not present

## 2022-07-07 DIAGNOSIS — M5136 Other intervertebral disc degeneration, lumbar region: Secondary | ICD-10-CM | POA: Diagnosis not present

## 2022-07-13 DIAGNOSIS — E1165 Type 2 diabetes mellitus with hyperglycemia: Secondary | ICD-10-CM | POA: Diagnosis not present

## 2022-07-13 DIAGNOSIS — R509 Fever, unspecified: Secondary | ICD-10-CM | POA: Diagnosis not present

## 2022-07-13 DIAGNOSIS — Z299 Encounter for prophylactic measures, unspecified: Secondary | ICD-10-CM | POA: Diagnosis not present

## 2022-07-13 DIAGNOSIS — J019 Acute sinusitis, unspecified: Secondary | ICD-10-CM | POA: Diagnosis not present

## 2022-07-13 DIAGNOSIS — I10 Essential (primary) hypertension: Secondary | ICD-10-CM | POA: Diagnosis not present

## 2022-07-23 ENCOUNTER — Ambulatory Visit (INDEPENDENT_AMBULATORY_CARE_PROVIDER_SITE_OTHER): Payer: Medicare Other | Admitting: Diagnostic Neuroimaging

## 2022-07-23 ENCOUNTER — Encounter: Payer: Self-pay | Admitting: Diagnostic Neuroimaging

## 2022-07-23 VITALS — BP 125/80 | HR 80 | Ht 62.0 in | Wt 138.2 lb

## 2022-07-23 DIAGNOSIS — I161 Hypertensive emergency: Secondary | ICD-10-CM

## 2022-07-23 NOTE — Progress Notes (Signed)
[facility]  PATIENT: Rachel Perkins DOB: 1950/07/10  REFERRING CLINICIAN: Murlean Iba, MD HISTORY FROM: patient  REASON FOR VISIT: new consult   HISTORICAL  CHIEF COMPLAINT:  Chief Complaint  Patient presents with   New Patient (Initial Visit)    Patient in room #7 with her husband. Patient here today to f/u with her stroke. Patient states she fell about two weeks ago.    HISTORY OF PRESENT ILLNESS:   72 year old female here for evaluation of abnormal spell.  January 2024 patient was at home when she became confused with slurred speech.  She had some nausea and vomiting.  Went to the hospital for evaluation.  She had significant hypertension systolic blood pressure greater than 200.  She was admitted for evaluation.  Her symptoms improved within 24 hours.  Blood pressure normalized.  MRI was negative for stroke.   REVIEW OF SYSTEMS: Full 14 system review of systems performed and negative with exception of: as per HPI.  ALLERGIES: Allergies  Allergen Reactions   Codeine Itching    Pt says she can take    Other Anaphylaxis    NUTS   LOBSTER - causes itching      Penicillins Rash    DID THE REACTION INVOLVE: Swelling of the face/tongue/throat, SOB, or low BP? Unknown Sudden or severe rash/hives, skin peeling, or the inside of the mouth or nose? Unknown Did it require medical treatment? Unknown When did it last happen?      unk If all above answers are "NO", may proceed with cephalosporin use.    Sulfa Antibiotics Rash   Lisinopril Cough   Lorazepam Other (See Comments)    Confusion  Hyper  Other Reaction(s): Confusion  Other reaction(s): Confusion  Hyper  Other reaction(s): Confusion Hyper    Hyper    HOME MEDICATIONS: Outpatient Medications Prior to Visit  Medication Sig Dispense Refill   acetaminophen (TYLENOL) 325 MG tablet Take 1-2 tablets (325-650 mg total) by mouth every 4 (four) hours as needed for mild pain.      atorvastatin (LIPITOR) 80 MG tablet Take 80 mg by mouth daily.     azelastine (ASTELIN) 0.1 % nasal spray Place 2 sprays into both nostrils 2 (two) times daily as needed for rhinitis or allergies. Use in each nostril as directed     calcium-vitamin D (OSCAL WITH D) 500-200 MG-UNIT tablet Take 1 tablet by mouth daily with breakfast.     cetirizine (ZYRTEC) 10 MG chewable tablet Chew 10 mg by mouth daily.     cholecalciferol (VITAMIN D3) 25 MCG (1000 UNIT) tablet Take 1,000 Units by mouth daily.     cyclobenzaprine (FLEXERIL) 10 MG tablet Take 10 mg by mouth 3 (three) times daily as needed for muscle spasms.     escitalopram (LEXAPRO) 10 MG tablet Take 10 mg by mouth daily.     gabapentin (NEURONTIN) 300 MG capsule Take 300 mg by mouth 3 (three) times daily.     metFORMIN (GLUCOPHAGE) 500 MG tablet Take 500 mg by mouth 2 (two) times daily.     metoprolol tartrate (LOPRESSOR) 25 MG tablet Take 1 tablet (25 mg total) by mouth 2 (two) times daily. 60 tablet 0   nitroGLYCERIN (NITROSTAT) 0.4 MG SL tablet PLACE 1 TABLET UNDER THE TONGUE EVERY 5 MINUTES FOR 3 DOSES AS NEEDED CHEST PAIN 25 tablet 0   warfarin (COUMADIN) 5 MG tablet Take 1 tablet (5 mg total) by mouth daily at 6 PM. 30 tablet 0  No facility-administered medications prior to visit.    PAST MEDICAL HISTORY: Past Medical History:  Diagnosis Date   Anginal pain ([place]) 09/25/2013   Arthritis    CAD (coronary artery disease)    a. 09/25/13 s/p DES to mLCx and DES to pRCA.   Chronic left-sided back pain    has had ESI/Dr. Francesco Runner   DM (diabetes mellitus) ([place])    HTN (hypertension)    Myocardial infarction ([place]) 09/2013   nstemi   Stroke ([place]) 05/11/2018   Tobacco abuse     PAST SURGICAL HISTORY: Past Surgical History:  Procedure Laterality Date   ABDOMINAL HYSTERECTOMY     ANKLE SURGERY Left    APPENDECTOMY     BREAST BIOPSY Right 06/09/2021   BREAST LUMPECTOMY WITH RADIOACTIVE SEED LOCALIZATION Right 02/11/2022   Procedure:  RIGHT BREAST LUMPECTOMY WITH RADIOACTIVE SEED LOCALIZATION;  Surgeon: Coralie Keens, MD;  Location: [facility];  Service: General;  Laterality: Right;   CERVICAL SPINE SURGERY  2021   ACDF by Dr. Newman Pies   CORONARY ANGIOPLASTY  09/25/2013   des mid circumflex  des rca   LEFT HEART CATHETERIZATION WITH CORONARY ANGIOGRAM N/A 09/25/2013   Procedure: [place];  Surgeon: Burnell Blanks, MD;  Location[facility] C[facility] LAB;  Service: Cardiovascular;  Laterality: N/A;   skin grafts right arm      FAMILY HISTORY: Family History  Problem Relation Age of Onset   Heart failure Father 23   Cirrhosis Mother    Alcohol abuse Mother    Diabetes Sister    Breast cancer Sister        twin sister   Uterine cancer Sister     SOCIAL HISTORY: Social History   Socioeconomic History   Marital status: Married    Spouse name: Not on file   Number of children: 2   Years of education: Not on file   Highest education level: Not on file  Occupational History   Occupation: Biochemist, clinical  Tobacco Use   Smoking status: Every Day    Packs/day: 0.50    Years: 50.00    Additional pack years: 0.00    Total pack years: 25.00    Types: Cigarettes    Start date: 06/04/1964   Smokeless tobacco: Never  Vaping Use   Vaping Use: Never used  Substance and Sexual Activity   Alcohol use: No    Alcohol/week: 0.0 standard drinks of alcohol   Drug use: No   Sexual activity: Not on file  Other Topics Concern   Not on file  Social History Narrative   Not on file   Social Determinants of Health   Financial Resource Strain: Not on file  Food Insecurity: No Food Insecurity (05/26/2022)   Hunger Vital Sign    Worried About Running Out of Food in the Last Year: Never true    Ran Out of Food in the Last Year: Never true  Transportation Needs: No Transportation Needs (05/26/2022)   PRAPARE - Hydrologist (Medical): No    Lack of  Transportation (Non-Medical): No  Physical Activity: Not on file  Stress: Not on file  Social Connections: Not on file  Intimate Partner Violence: Not At Risk (05/26/2022)   Humiliation, Afraid, Rape, and Kick questionnaire    Fear of Current or Ex-Partner: No    Emotionally Abused: No    Physically Abused: No    Sexually Abused: No     PHYSICAL EXAM  GENERAL EXAM/CONSTITUTIONAL: Vitals:  Vitals:   07/23/22 1447  BP: 125/80  Pulse: 80  Weight: 138 lb 3.2 oz (62.7 kg)  Height: '5\' 2"'$  (1.575 m)   Body mass index is 25.28 kg/m. Wt Readings from Last 3 Encounters:  07/23/22 138 lb 3.2 oz (62.7 kg)  02/11/22 136 lb (61.7 kg)  02/05/22 137 lb 1.6 oz (62.2 kg)   Patient is in no distress; well developed, nourished and groomed; neck is supple  CARDIOVASCULAR: Examination of carotid arteries is normal; no carotid bruits Regular rate and rhythm, no murmurs Examination of peripheral vascular system by observation and palpation is normal  EYES: Ophthalmoscopic exam of optic discs and posterior segments is normal; no papilledema or hemorrhages No results found.  MUSCULOSKELETAL: Gait, strength, tone, movements noted in Neurologic exam below  NEUROLOGIC: MENTAL STATUS:      No data to display         awake, alert, oriented to person, place and time recent and remote memory intact normal attention and concentration language fluent, comprehension intact, naming intact fund of knowledge appropriate  CRANIAL NERVE:  2nd - no papilledema on fundoscopic exam 2nd, 3rd, 4th, 6th - pupils equal and reactive to light, visual fields full to confrontation, extraocular muscles intact, no nystagmus 5th - facial sensation symmetric 7th - facial strength symmetric 8th - hearing intact 9th - palate elevates symmetrically, uvula midline 11th - shoulder shrug symmetric 12th - tongue protrusion midline  MOTOR:  normal bulk and tone, full strength in the BUE, BLE  SENSORY:  normal  and symmetric to light touch, temperature, vibration  COORDINATION:  finger-nose-finger, fine finger movements normal  REFLEXES:  deep tendon reflexes TRACE and symmetric  GAIT/STATION:  narrow based gait     DIAGNOSTIC DATA (LABS, IMAGING, TESTING) - I reviewed patient records, labs, notes, testing and imaging myself where available.  Lab Results  Component Value Date   WBC 11.7 (H) 05/27/2022   HGB 14.1 05/27/2022   HCT 41.4 05/27/2022   MCV 92.6 05/27/2022   PLT 262 05/27/2022      Component Value Date/Time   NA 136 05/27/2022 0006   K 3.7 05/27/2022 0006   CL 105 05/27/2022 0006   CO2 23 05/27/2022 0006   GLUCOSE 108 (H) 05/27/2022 0006   BUN 22 05/27/2022 0006   CREATININE 1.10 (H) 05/27/2022 0006   CALCIUM 8.6 (L) 05/27/2022 0006   PROT 8.2 (H) 05/25/2022 2026   ALBUMIN 4.7 05/25/2022 2026   AST 22 05/25/2022 2026   ALT 27 05/25/2022 2026   ALKPHOS 95 05/25/2022 2026   BILITOT 0.9 05/25/2022 2026   GFRNONAA 53 (L) 05/27/2022 0006   GFRAA >60 05/23/2018 0659   Lab Results  Component Value Date   CHOL 105 05/26/2022   HDL 45 05/26/2022   LDLCALC 48 05/26/2022   TRIG 58 05/26/2022   CHOLHDL 2.3 05/26/2022   Lab Results  Component Value Date   HGBA1C 6.9 (H) 05/26/2022   No results found for: "VITAMINB12" No results found for: "TSH"  05/26/22 MRI brain  1. Intermittently motion degraded examination. 2. No evidence of acute intracranial abnormality. The diffusion-weighted imaging is of good quality, and there is no evidence of an acute infarct. 3. Chronic cortically-based infarcts within the bilateral cerebral hemispheres, as described. 4. Chronic lacunar infarct within the right basal ganglia/internal capsule. 5. Background moderate chronic small vessel ischemic changes within the cerebral white matter. 6. Tiny chronic infarct within the left cerebellar hemisphere.  05/25/22 CTA head /  neck 1. Age-indeterminate occlusion of the left vertebral  artery V1 and proximal V2 segments. This is new since 05/11/18. 2. No intracranial arterial occlusion or hemodynamically significant stenosis. 3. Bilateral carotid bifurcation atherosclerosis without stenosis or occlusion.   ASSESSMENT AND PLAN  72 y.o. year old female here with:   Dx:  1. Hypertensive emergency     PLAN:  HYPERTENSIVE EMERGENCY (slurred speech, confusion, nausea / vomiting; 217/59) - symptoms lasted 24 hours; MRI was negative; now back to baseline  STROKE PREVENTION - continue coumadin (afib), statin, BP control, metformin  Return for return to PCP.    Penni Bombard, MD [id number], 0000000 PM Certified in Neurology, Neurophysiology and Neuroimaging  [facility] [address], [place], [place] [address] [phone]

## 2022-07-30 DIAGNOSIS — Z299 Encounter for prophylactic measures, unspecified: Secondary | ICD-10-CM | POA: Diagnosis not present

## 2022-07-30 DIAGNOSIS — I10 Essential (primary) hypertension: Secondary | ICD-10-CM | POA: Diagnosis not present

## 2022-07-30 DIAGNOSIS — G8194 Hemiplegia, unspecified affecting left nondominant side: Secondary | ICD-10-CM | POA: Diagnosis not present

## 2022-07-30 DIAGNOSIS — I69952 Hemiplegia and hemiparesis following unspecified cerebrovascular disease affecting left dominant side: Secondary | ICD-10-CM | POA: Diagnosis not present

## 2022-08-05 DIAGNOSIS — M5126 Other intervertebral disc displacement, lumbar region: Secondary | ICD-10-CM | POA: Diagnosis not present

## 2022-08-05 DIAGNOSIS — M48061 Spinal stenosis, lumbar region without neurogenic claudication: Secondary | ICD-10-CM | POA: Diagnosis not present

## 2022-08-05 DIAGNOSIS — M5136 Other intervertebral disc degeneration, lumbar region: Secondary | ICD-10-CM | POA: Diagnosis not present

## 2022-08-05 DIAGNOSIS — M4726 Other spondylosis with radiculopathy, lumbar region: Secondary | ICD-10-CM | POA: Diagnosis not present

## 2022-08-10 DIAGNOSIS — Z1330 Encounter for screening examination for mental health and behavioral disorders, unspecified: Secondary | ICD-10-CM | POA: Diagnosis not present

## 2022-08-10 DIAGNOSIS — M792 Neuralgia and neuritis, unspecified: Secondary | ICD-10-CM | POA: Diagnosis not present

## 2022-08-10 DIAGNOSIS — M5136 Other intervertebral disc degeneration, lumbar region: Secondary | ICD-10-CM | POA: Diagnosis not present

## 2022-09-04 DIAGNOSIS — N183 Chronic kidney disease, stage 3 unspecified: Secondary | ICD-10-CM | POA: Diagnosis not present

## 2022-09-04 DIAGNOSIS — E1122 Type 2 diabetes mellitus with diabetic chronic kidney disease: Secondary | ICD-10-CM | POA: Diagnosis not present

## 2022-09-04 DIAGNOSIS — E1165 Type 2 diabetes mellitus with hyperglycemia: Secondary | ICD-10-CM | POA: Diagnosis not present

## 2022-09-04 DIAGNOSIS — Z299 Encounter for prophylactic measures, unspecified: Secondary | ICD-10-CM | POA: Diagnosis not present

## 2022-09-04 DIAGNOSIS — I69952 Hemiplegia and hemiparesis following unspecified cerebrovascular disease affecting left dominant side: Secondary | ICD-10-CM | POA: Diagnosis not present

## 2022-09-04 DIAGNOSIS — I10 Essential (primary) hypertension: Secondary | ICD-10-CM | POA: Diagnosis not present

## 2022-09-15 DIAGNOSIS — M792 Neuralgia and neuritis, unspecified: Secondary | ICD-10-CM | POA: Diagnosis not present

## 2022-10-07 DIAGNOSIS — I10 Essential (primary) hypertension: Secondary | ICD-10-CM | POA: Diagnosis not present

## 2022-10-07 DIAGNOSIS — Z299 Encounter for prophylactic measures, unspecified: Secondary | ICD-10-CM | POA: Diagnosis not present

## 2022-10-07 DIAGNOSIS — I69952 Hemiplegia and hemiparesis following unspecified cerebrovascular disease affecting left dominant side: Secondary | ICD-10-CM | POA: Diagnosis not present

## 2022-10-07 DIAGNOSIS — I679 Cerebrovascular disease, unspecified: Secondary | ICD-10-CM | POA: Diagnosis not present

## 2022-10-07 DIAGNOSIS — I2510 Atherosclerotic heart disease of native coronary artery without angina pectoris: Secondary | ICD-10-CM | POA: Diagnosis not present

## 2022-11-03 DIAGNOSIS — M7918 Myalgia, other site: Secondary | ICD-10-CM | POA: Diagnosis not present

## 2022-11-03 DIAGNOSIS — G8929 Other chronic pain: Secondary | ICD-10-CM | POA: Diagnosis not present

## 2022-11-03 DIAGNOSIS — M48061 Spinal stenosis, lumbar region without neurogenic claudication: Secondary | ICD-10-CM | POA: Diagnosis not present

## 2022-11-03 DIAGNOSIS — M5136 Other intervertebral disc degeneration, lumbar region: Secondary | ICD-10-CM | POA: Diagnosis not present

## 2022-11-04 DIAGNOSIS — I69952 Hemiplegia and hemiparesis following unspecified cerebrovascular disease affecting left dominant side: Secondary | ICD-10-CM | POA: Diagnosis not present

## 2022-11-04 DIAGNOSIS — Z299 Encounter for prophylactic measures, unspecified: Secondary | ICD-10-CM | POA: Diagnosis not present

## 2022-11-04 DIAGNOSIS — D6869 Other thrombophilia: Secondary | ICD-10-CM | POA: Diagnosis not present

## 2022-11-04 DIAGNOSIS — I10 Essential (primary) hypertension: Secondary | ICD-10-CM | POA: Diagnosis not present

## 2022-11-04 DIAGNOSIS — R233 Spontaneous ecchymoses: Secondary | ICD-10-CM | POA: Diagnosis not present

## 2022-11-13 DIAGNOSIS — J329 Chronic sinusitis, unspecified: Secondary | ICD-10-CM | POA: Diagnosis not present

## 2022-11-13 DIAGNOSIS — R0981 Nasal congestion: Secondary | ICD-10-CM | POA: Diagnosis not present

## 2022-11-19 DIAGNOSIS — I10 Essential (primary) hypertension: Secondary | ICD-10-CM | POA: Diagnosis not present

## 2022-11-19 DIAGNOSIS — Z299 Encounter for prophylactic measures, unspecified: Secondary | ICD-10-CM | POA: Diagnosis not present

## 2022-11-19 DIAGNOSIS — Z8673 Personal history of transient ischemic attack (TIA), and cerebral infarction without residual deficits: Secondary | ICD-10-CM | POA: Diagnosis not present

## 2022-12-01 DIAGNOSIS — E119 Type 2 diabetes mellitus without complications: Secondary | ICD-10-CM | POA: Diagnosis not present

## 2022-12-01 DIAGNOSIS — M5459 Other low back pain: Secondary | ICD-10-CM | POA: Diagnosis not present

## 2022-12-01 DIAGNOSIS — M47816 Spondylosis without myelopathy or radiculopathy, lumbar region: Secondary | ICD-10-CM | POA: Diagnosis not present

## 2022-12-21 DIAGNOSIS — E1165 Type 2 diabetes mellitus with hyperglycemia: Secondary | ICD-10-CM | POA: Diagnosis not present

## 2022-12-21 DIAGNOSIS — E1122 Type 2 diabetes mellitus with diabetic chronic kidney disease: Secondary | ICD-10-CM | POA: Diagnosis not present

## 2022-12-21 DIAGNOSIS — Z299 Encounter for prophylactic measures, unspecified: Secondary | ICD-10-CM | POA: Diagnosis not present

## 2022-12-21 DIAGNOSIS — I69952 Hemiplegia and hemiparesis following unspecified cerebrovascular disease affecting left dominant side: Secondary | ICD-10-CM | POA: Diagnosis not present

## 2022-12-21 DIAGNOSIS — I10 Essential (primary) hypertension: Secondary | ICD-10-CM | POA: Diagnosis not present

## 2022-12-21 DIAGNOSIS — N183 Chronic kidney disease, stage 3 unspecified: Secondary | ICD-10-CM | POA: Diagnosis not present

## 2023-01-15 DIAGNOSIS — Z299 Encounter for prophylactic measures, unspecified: Secondary | ICD-10-CM | POA: Diagnosis not present

## 2023-01-15 DIAGNOSIS — D692 Other nonthrombocytopenic purpura: Secondary | ICD-10-CM | POA: Diagnosis not present

## 2023-01-15 DIAGNOSIS — R519 Headache, unspecified: Secondary | ICD-10-CM | POA: Diagnosis not present

## 2023-01-15 DIAGNOSIS — U071 COVID-19: Secondary | ICD-10-CM | POA: Diagnosis not present

## 2023-01-15 DIAGNOSIS — E1129 Type 2 diabetes mellitus with other diabetic kidney complication: Secondary | ICD-10-CM | POA: Diagnosis not present

## 2023-02-03 DIAGNOSIS — I10 Essential (primary) hypertension: Secondary | ICD-10-CM | POA: Diagnosis not present

## 2023-02-03 DIAGNOSIS — Z23 Encounter for immunization: Secondary | ICD-10-CM | POA: Diagnosis not present

## 2023-02-03 DIAGNOSIS — E7800 Pure hypercholesterolemia, unspecified: Secondary | ICD-10-CM | POA: Diagnosis not present

## 2023-02-03 DIAGNOSIS — I69952 Hemiplegia and hemiparesis following unspecified cerebrovascular disease affecting left dominant side: Secondary | ICD-10-CM | POA: Diagnosis not present

## 2023-02-03 DIAGNOSIS — Z79899 Other long term (current) drug therapy: Secondary | ICD-10-CM | POA: Diagnosis not present

## 2023-02-03 DIAGNOSIS — Z299 Encounter for prophylactic measures, unspecified: Secondary | ICD-10-CM | POA: Diagnosis not present

## 2023-02-03 DIAGNOSIS — E559 Vitamin D deficiency, unspecified: Secondary | ICD-10-CM | POA: Diagnosis not present

## 2023-02-03 DIAGNOSIS — F17210 Nicotine dependence, cigarettes, uncomplicated: Secondary | ICD-10-CM | POA: Diagnosis not present

## 2023-02-03 DIAGNOSIS — Z1339 Encounter for screening examination for other mental health and behavioral disorders: Secondary | ICD-10-CM | POA: Diagnosis not present

## 2023-02-03 DIAGNOSIS — Z0000 Encounter for general adult medical examination without abnormal findings: Secondary | ICD-10-CM | POA: Diagnosis not present

## 2023-02-03 DIAGNOSIS — Z1331 Encounter for screening for depression: Secondary | ICD-10-CM | POA: Diagnosis not present

## 2023-02-03 DIAGNOSIS — R5383 Other fatigue: Secondary | ICD-10-CM | POA: Diagnosis not present

## 2023-02-03 DIAGNOSIS — Z7189 Other specified counseling: Secondary | ICD-10-CM | POA: Diagnosis not present

## 2023-03-01 DIAGNOSIS — M47816 Spondylosis without myelopathy or radiculopathy, lumbar region: Secondary | ICD-10-CM | POA: Diagnosis not present

## 2023-03-01 DIAGNOSIS — M5459 Other low back pain: Secondary | ICD-10-CM | POA: Diagnosis not present

## 2023-03-03 DIAGNOSIS — E1169 Type 2 diabetes mellitus with other specified complication: Secondary | ICD-10-CM | POA: Diagnosis not present

## 2023-03-03 DIAGNOSIS — Z299 Encounter for prophylactic measures, unspecified: Secondary | ICD-10-CM | POA: Diagnosis not present

## 2023-03-03 DIAGNOSIS — I10 Essential (primary) hypertension: Secondary | ICD-10-CM | POA: Diagnosis not present

## 2023-03-03 DIAGNOSIS — G8194 Hemiplegia, unspecified affecting left nondominant side: Secondary | ICD-10-CM | POA: Diagnosis not present

## 2023-03-23 DIAGNOSIS — E119 Type 2 diabetes mellitus without complications: Secondary | ICD-10-CM | POA: Diagnosis not present

## 2023-03-23 DIAGNOSIS — H35451 Secondary pigmentary degeneration, right eye: Secondary | ICD-10-CM | POA: Diagnosis not present

## 2023-03-23 DIAGNOSIS — H524 Presbyopia: Secondary | ICD-10-CM | POA: Diagnosis not present

## 2023-03-23 DIAGNOSIS — H52223 Regular astigmatism, bilateral: Secondary | ICD-10-CM | POA: Diagnosis not present

## 2023-03-23 DIAGNOSIS — H5201 Hypermetropia, right eye: Secondary | ICD-10-CM | POA: Diagnosis not present

## 2023-03-23 DIAGNOSIS — Z9849 Cataract extraction status, unspecified eye: Secondary | ICD-10-CM | POA: Diagnosis not present

## 2023-04-09 DIAGNOSIS — R233 Spontaneous ecchymoses: Secondary | ICD-10-CM | POA: Diagnosis not present

## 2023-04-09 DIAGNOSIS — I10 Essential (primary) hypertension: Secondary | ICD-10-CM | POA: Diagnosis not present

## 2023-04-09 DIAGNOSIS — Z299 Encounter for prophylactic measures, unspecified: Secondary | ICD-10-CM | POA: Diagnosis not present

## 2023-04-09 DIAGNOSIS — G8194 Hemiplegia, unspecified affecting left nondominant side: Secondary | ICD-10-CM | POA: Diagnosis not present

## 2023-04-30 DIAGNOSIS — Z299 Encounter for prophylactic measures, unspecified: Secondary | ICD-10-CM | POA: Diagnosis not present

## 2023-04-30 DIAGNOSIS — R0981 Nasal congestion: Secondary | ICD-10-CM | POA: Diagnosis not present

## 2023-04-30 DIAGNOSIS — R059 Cough, unspecified: Secondary | ICD-10-CM | POA: Diagnosis not present

## 2023-04-30 DIAGNOSIS — I69952 Hemiplegia and hemiparesis following unspecified cerebrovascular disease affecting left dominant side: Secondary | ICD-10-CM | POA: Diagnosis not present

## 2023-04-30 DIAGNOSIS — F339 Major depressive disorder, recurrent, unspecified: Secondary | ICD-10-CM | POA: Diagnosis not present

## 2023-05-10 DIAGNOSIS — G8194 Hemiplegia, unspecified affecting left nondominant side: Secondary | ICD-10-CM | POA: Diagnosis not present

## 2023-05-10 DIAGNOSIS — R093 Abnormal sputum: Secondary | ICD-10-CM | POA: Diagnosis not present

## 2023-05-10 DIAGNOSIS — I10 Essential (primary) hypertension: Secondary | ICD-10-CM | POA: Diagnosis not present

## 2023-05-10 DIAGNOSIS — Z299 Encounter for prophylactic measures, unspecified: Secondary | ICD-10-CM | POA: Diagnosis not present

## 2023-05-19 ENCOUNTER — Ambulatory Visit: Payer: Medicare Other | Admitting: Internal Medicine

## 2023-05-20 DIAGNOSIS — E1169 Type 2 diabetes mellitus with other specified complication: Secondary | ICD-10-CM | POA: Diagnosis not present

## 2023-05-20 DIAGNOSIS — R52 Pain, unspecified: Secondary | ICD-10-CM | POA: Diagnosis not present

## 2023-05-20 DIAGNOSIS — N183 Chronic kidney disease, stage 3 unspecified: Secondary | ICD-10-CM | POA: Diagnosis not present

## 2023-05-20 DIAGNOSIS — M2550 Pain in unspecified joint: Secondary | ICD-10-CM | POA: Diagnosis not present

## 2023-05-20 DIAGNOSIS — Z299 Encounter for prophylactic measures, unspecified: Secondary | ICD-10-CM | POA: Diagnosis not present

## 2023-05-20 DIAGNOSIS — H6123 Impacted cerumen, bilateral: Secondary | ICD-10-CM | POA: Diagnosis not present

## 2023-05-20 DIAGNOSIS — I10 Essential (primary) hypertension: Secondary | ICD-10-CM | POA: Diagnosis not present

## 2023-05-26 DIAGNOSIS — M47816 Spondylosis without myelopathy or radiculopathy, lumbar region: Secondary | ICD-10-CM | POA: Diagnosis not present

## 2023-05-26 DIAGNOSIS — E119 Type 2 diabetes mellitus without complications: Secondary | ICD-10-CM | POA: Diagnosis not present

## 2023-06-10 DIAGNOSIS — N1831 Chronic kidney disease, stage 3a: Secondary | ICD-10-CM | POA: Diagnosis not present

## 2023-06-10 DIAGNOSIS — I69952 Hemiplegia and hemiparesis following unspecified cerebrovascular disease affecting left dominant side: Secondary | ICD-10-CM | POA: Diagnosis not present

## 2023-06-10 DIAGNOSIS — R52 Pain, unspecified: Secondary | ICD-10-CM | POA: Diagnosis not present

## 2023-06-10 DIAGNOSIS — E1169 Type 2 diabetes mellitus with other specified complication: Secondary | ICD-10-CM | POA: Diagnosis not present

## 2023-06-10 DIAGNOSIS — D699 Hemorrhagic condition, unspecified: Secondary | ICD-10-CM | POA: Diagnosis not present

## 2023-06-10 DIAGNOSIS — I10 Essential (primary) hypertension: Secondary | ICD-10-CM | POA: Diagnosis not present

## 2023-06-10 DIAGNOSIS — Z299 Encounter for prophylactic measures, unspecified: Secondary | ICD-10-CM | POA: Diagnosis not present

## 2023-06-16 DIAGNOSIS — M5459 Other low back pain: Secondary | ICD-10-CM | POA: Diagnosis not present

## 2023-06-16 DIAGNOSIS — M47816 Spondylosis without myelopathy or radiculopathy, lumbar region: Secondary | ICD-10-CM | POA: Diagnosis not present

## 2023-06-28 ENCOUNTER — Encounter: Payer: Self-pay | Admitting: Internal Medicine

## 2023-06-28 ENCOUNTER — Ambulatory Visit: Payer: Medicare Other | Attending: Internal Medicine | Admitting: Internal Medicine

## 2023-06-28 VITALS — BP 142/82 | HR 54 | Ht 63.0 in | Wt 134.4 lb

## 2023-06-28 DIAGNOSIS — I2510 Atherosclerotic heart disease of native coronary artery without angina pectoris: Secondary | ICD-10-CM | POA: Diagnosis not present

## 2023-06-28 NOTE — Progress Notes (Signed)
HPI Rachel Perkins is referred for ongoing evaluation. I had seen her husband in the past. She had an aortic valve mass which resolved on TEE just prior to valve surgery. She has been on coumadin after this was thought to have caused a stroke. She has minimal difficulty with her left upper extremity. No speech or hand problems. The patient denies chest pain, sob, or peripheral edema. No syncope.  Allergies  Allergen Reactions   Codeine Itching    Pt says she can take    Other Anaphylaxis    NUTS   LOBSTER - causes itching      Penicillins Rash    DID THE REACTION INVOLVE: Swelling of the face/tongue/throat, SOB, or low BP? Unknown Sudden or severe rash/hives, skin peeling, or the inside of the mouth or nose? Unknown Did it require medical treatment? Unknown When did it last happen?      unk If all above answers are "NO", may proceed with cephalosporin use.    Sulfa Antibiotics Rash   Lisinopril Cough   Lorazepam Other (See Comments)    Confusion  Hyper  Other Reaction(s): Confusion  Other reaction(s): Confusion  Hyper  Other reaction(s): Confusion Hyper    Hyper     Current Outpatient Medications  Medication Sig Dispense Refill   acetaminophen (TYLENOL) 325 MG tablet Take 1-2 tablets (325-650 mg total) by mouth every 4 (four) hours as needed for mild pain.     atorvastatin (LIPITOR) 80 MG tablet Take 80 mg by mouth daily.     azelastine (ASTELIN) 0.1 % nasal spray Place 2 sprays into both nostrils 2 (two) times daily as needed for rhinitis or allergies. Use in each nostril as directed     calcium-vitamin D (OSCAL WITH D) 500-200 MG-UNIT tablet Take 1 tablet by mouth daily with breakfast.     cetirizine (ZYRTEC) 10 MG chewable tablet Chew 10 mg by mouth daily.     cholecalciferol (VITAMIN D3) 25 MCG (1000 UNIT) tablet Take 1,000 Units by mouth daily.     cyclobenzaprine (FLEXERIL) 10 MG tablet Take 10 mg by mouth 3 (three) times daily as needed for muscle spasms.      escitalopram (LEXAPRO) 10 MG tablet Take 10 mg by mouth daily.     metFORMIN (GLUCOPHAGE) 500 MG tablet Take 500 mg by mouth 2 (two) times daily.     metoprolol tartrate (LOPRESSOR) 25 MG tablet Take 1 tablet (25 mg total) by mouth 2 (two) times daily. 60 tablet 0   nitroGLYCERIN (NITROSTAT) 0.4 MG SL tablet PLACE 1 TABLET UNDER THE TONGUE EVERY 5 MINUTES FOR 3 DOSES AS NEEDED CHEST PAIN 25 tablet 0   warfarin (COUMADIN) 5 MG tablet Take 1 tablet (5 mg total) by mouth daily at 6 PM. 30 tablet 0   No current facility-administered medications for this visit.     Past Medical History:  Diagnosis Date   Anginal pain ([name]) 09/25/2013   Arthritis    CAD (coronary artery disease)    a. 09/25/13 s/p DES to mLCx and DES to pRCA.   Chronic left-sided back pain    has had ESI/Dr. Laurian Brim   DM (diabetes mellitus) ([name])    HTN (hypertension)    Myocardial infarction ([name]) 09/2013   nstemi   Stroke ([name]) 05/11/2018   Tobacco abuse     ROS:   All systems reviewed and negative except as noted in the HPI.   Past Surgical History:  Procedure Laterality Date  ABDOMINAL HYSTERECTOMY     ANKLE SURGERY Left    APPENDECTOMY     BREAST BIOPSY Right 06/09/2021   BREAST LUMPECTOMY WITH RADIOACTIVE SEED LOCALIZATION Right 02/11/2022   Procedure: RIGHT BREAST LUMPECTOMY WITH RADIOACTIVE SEED LOCALIZATION;  Surgeon: Abigail Miyamoto, MD;  Location: [facility] OR;  Service: General;  Laterality: Right;   CERVICAL SPINE SURGERY  2021   ACDF by Dr. Tressie Stalker   CORONARY ANGIOPLASTY  09/25/2013   des mid circumflex  des rca   LEFT HEART CATHETERIZATION WITH CORONARY ANGIOGRAM N/A 09/25/2013   Procedure: LEFT HEART CATHETERIZATION WITH CORONARY ANGIOGRAM;  Surgeon: Kathleene Hazel, MD;  Location: [facility] CATH LAB;  Service: Cardiovascular;  Laterality: N/A;   skin grafts right arm       Family History  Problem Relation Age of Onset   Heart failure Father 69   Cirrhosis Mother    Alcohol abuse  Mother    Diabetes Sister    Breast cancer Sister        twin sister   Uterine cancer Sister      Social History   Socioeconomic History   Marital status: Married    Spouse name: Not on file   Number of children: 2   Years of education: Not on file   Highest education level: Not on file  Occupational History   Occupation: Company secretary  Tobacco Use   Smoking status: Every Day    Current packs/day: 0.50    Average packs/day: 0.5 packs/day for 59.1 years (29.5 ttl pk-yrs)    Types: Cigarettes    Start date: 06/04/1964   Smokeless tobacco: Never  Vaping Use   Vaping status: Never Used  Substance and Sexual Activity   Alcohol use: No    Alcohol/week: 0.0 standard drinks of alcohol   Drug use: No   Sexual activity: Not on file  Other Topics Concern   Not on file  Social History Narrative   Not on file   Social Drivers of Health   Financial Resource Strain: Low Risk  (11/03/2022)   Received from [facility]   Overall Financial Resource Strain (CARDIA)    Difficulty of Paying Living Expenses: Not hard at all  Food Insecurity: No Food Insecurity (11/03/2022)   Received from [facility]   Hunger Vital Sign    Worried About Running Out of Food in the Last Year: Never true    Ran Out of Food in the Last Year: Never true  Transportation Needs: No Transportation Needs (11/03/2022)   [facility] - Transportation    Lack of Transportation (Medical): No    Lack of Transportation (Non-Medical): No  Physical Activity: Not on file  Stress: Not on file  Social Connections: Unknown (09/21/2021)   [facility] from [facility]   Social Network    Social Network: Not on file  Intimate Partner Violence: Not At Risk (05/26/2022)   Humiliation, Afraid, Rape, and Kick questionnaire    Fear of Current or Ex-Partner: No    Emotionally Abused: No    Physically Abused: No    Sexually Abused: No     BP (!) 142/82   Pulse (!) 54   Ht 5\' 3"   (1.6 m)   Wt 134 lb 6.4 oz (61 kg)   SpO2 97%   BMI 23.81 kg/m   Physical Exam:  Well appearing 73 yo womnn, NAD HEENT: Unremarkable Neck:  No JVD, no thyromegally Lymphatics:  No adenopathy Back:  No CVA tenderness Lungs:  Clear with no wheezes HEART:  Regular rate rhythm, no murmurs, no rubs, no clicks Abd:  soft, positive bowel sounds, no organomegally, no rebound, no guarding Ext:  2 plus pulses, no edema, no cyanosis, no clubbing Skin:  No rashes no nodules Neuro:  CN II through XII intact, motor grossly intact  EKG - sinus brady with RBBB/left axis.  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 Otherwise doing well with no residual. I would continue coumadin for now unless she were to start bleeding. HTN - her bp is a little elevated. She will continue a low sodium diet.No chang in meds for now. Continue metoprolol.  Sharlot Gowda Cherylene Ferrufino,MD

## 2023-06-28 NOTE — Patient Instructions (Signed)
Medication Instructions:  Your physician recommends that you continue on your current medications as directed. Please refer to the Current Medication list given to you today.   Labwork: None today  Testing/Procedures: None today  Follow-Up: 1 year Dr.Mallipeddi  Any Other Special Instructions Will Be Listed Below (If Applicable).  If you need a refill on your cardiac medications before your next appointment, please call your pharmacy.

## 2023-07-08 DIAGNOSIS — N1831 Chronic kidney disease, stage 3a: Secondary | ICD-10-CM | POA: Diagnosis not present

## 2023-07-08 DIAGNOSIS — G8194 Hemiplegia, unspecified affecting left nondominant side: Secondary | ICD-10-CM | POA: Diagnosis not present

## 2023-07-08 DIAGNOSIS — I10 Essential (primary) hypertension: Secondary | ICD-10-CM | POA: Diagnosis not present

## 2023-07-08 DIAGNOSIS — E1169 Type 2 diabetes mellitus with other specified complication: Secondary | ICD-10-CM | POA: Diagnosis not present

## 2023-07-08 DIAGNOSIS — R52 Pain, unspecified: Secondary | ICD-10-CM | POA: Diagnosis not present

## 2023-07-08 DIAGNOSIS — Z299 Encounter for prophylactic measures, unspecified: Secondary | ICD-10-CM | POA: Diagnosis not present

## 2023-07-28 DIAGNOSIS — M47816 Spondylosis without myelopathy or radiculopathy, lumbar region: Secondary | ICD-10-CM | POA: Diagnosis not present

## 2023-07-28 DIAGNOSIS — M5459 Other low back pain: Secondary | ICD-10-CM | POA: Diagnosis not present

## 2023-08-06 DIAGNOSIS — Z299 Encounter for prophylactic measures, unspecified: Secondary | ICD-10-CM | POA: Diagnosis not present

## 2023-08-06 DIAGNOSIS — R52 Pain, unspecified: Secondary | ICD-10-CM | POA: Diagnosis not present

## 2023-08-06 DIAGNOSIS — L309 Dermatitis, unspecified: Secondary | ICD-10-CM | POA: Diagnosis not present

## 2023-08-06 DIAGNOSIS — N1831 Chronic kidney disease, stage 3a: Secondary | ICD-10-CM | POA: Diagnosis not present

## 2023-08-06 DIAGNOSIS — D699 Hemorrhagic condition, unspecified: Secondary | ICD-10-CM | POA: Diagnosis not present

## 2023-08-06 DIAGNOSIS — I10 Essential (primary) hypertension: Secondary | ICD-10-CM | POA: Diagnosis not present

## 2023-08-06 DIAGNOSIS — I69952 Hemiplegia and hemiparesis following unspecified cerebrovascular disease affecting left dominant side: Secondary | ICD-10-CM | POA: Diagnosis not present

## 2023-08-19 DIAGNOSIS — M47816 Spondylosis without myelopathy or radiculopathy, lumbar region: Secondary | ICD-10-CM | POA: Diagnosis not present

## 2023-08-19 DIAGNOSIS — M5459 Other low back pain: Secondary | ICD-10-CM | POA: Diagnosis not present

## 2023-09-08 DIAGNOSIS — D699 Hemorrhagic condition, unspecified: Secondary | ICD-10-CM | POA: Diagnosis not present

## 2023-09-08 DIAGNOSIS — Z299 Encounter for prophylactic measures, unspecified: Secondary | ICD-10-CM | POA: Diagnosis not present

## 2023-09-08 DIAGNOSIS — R52 Pain, unspecified: Secondary | ICD-10-CM | POA: Diagnosis not present

## 2023-09-08 DIAGNOSIS — I69952 Hemiplegia and hemiparesis following unspecified cerebrovascular disease affecting left dominant side: Secondary | ICD-10-CM | POA: Diagnosis not present

## 2023-09-08 DIAGNOSIS — I10 Essential (primary) hypertension: Secondary | ICD-10-CM | POA: Diagnosis not present

## 2023-09-14 ENCOUNTER — Other Ambulatory Visit: Payer: Self-pay | Admitting: Internal Medicine

## 2023-09-14 DIAGNOSIS — R52 Pain, unspecified: Secondary | ICD-10-CM | POA: Diagnosis not present

## 2023-09-14 DIAGNOSIS — I10 Essential (primary) hypertension: Secondary | ICD-10-CM | POA: Diagnosis not present

## 2023-09-14 DIAGNOSIS — I69359 Hemiplegia and hemiparesis following cerebral infarction affecting unspecified side: Secondary | ICD-10-CM | POA: Diagnosis not present

## 2023-09-14 DIAGNOSIS — F325 Major depressive disorder, single episode, in full remission: Secondary | ICD-10-CM | POA: Diagnosis not present

## 2023-09-14 DIAGNOSIS — R519 Headache, unspecified: Secondary | ICD-10-CM | POA: Diagnosis not present

## 2023-09-14 DIAGNOSIS — Z1231 Encounter for screening mammogram for malignant neoplasm of breast: Secondary | ICD-10-CM

## 2023-09-14 DIAGNOSIS — Z299 Encounter for prophylactic measures, unspecified: Secondary | ICD-10-CM | POA: Diagnosis not present

## 2023-09-14 DIAGNOSIS — W57XXXA Bitten or stung by nonvenomous insect and other nonvenomous arthropods, initial encounter: Secondary | ICD-10-CM | POA: Diagnosis not present

## 2023-09-30 DIAGNOSIS — M51362 Other intervertebral disc degeneration, lumbar region with discogenic back pain and lower extremity pain: Secondary | ICD-10-CM | POA: Diagnosis not present

## 2023-09-30 DIAGNOSIS — M4726 Other spondylosis with radiculopathy, lumbar region: Secondary | ICD-10-CM | POA: Diagnosis not present

## 2023-09-30 DIAGNOSIS — M47816 Spondylosis without myelopathy or radiculopathy, lumbar region: Secondary | ICD-10-CM | POA: Diagnosis not present

## 2023-09-30 DIAGNOSIS — M48061 Spinal stenosis, lumbar region without neurogenic claudication: Secondary | ICD-10-CM | POA: Diagnosis not present

## 2023-10-06 DIAGNOSIS — I10 Essential (primary) hypertension: Secondary | ICD-10-CM | POA: Diagnosis not present

## 2023-10-06 DIAGNOSIS — G8194 Hemiplegia, unspecified affecting left nondominant side: Secondary | ICD-10-CM | POA: Diagnosis not present

## 2023-10-06 DIAGNOSIS — F339 Major depressive disorder, recurrent, unspecified: Secondary | ICD-10-CM | POA: Diagnosis not present

## 2023-10-06 DIAGNOSIS — Z299 Encounter for prophylactic measures, unspecified: Secondary | ICD-10-CM | POA: Diagnosis not present

## 2023-10-06 DIAGNOSIS — N1831 Chronic kidney disease, stage 3a: Secondary | ICD-10-CM | POA: Diagnosis not present

## 2023-10-12 ENCOUNTER — Ambulatory Visit
Admission: RE | Admit: 2023-10-12 | Discharge: 2023-10-12 | Disposition: A | Discharge status: Home / Self Care | Source: Ambulatory Visit | Attending: Internal Medicine | Admitting: Internal Medicine

## 2023-10-12 DIAGNOSIS — Z1231 Encounter for screening mammogram for malignant neoplasm of breast: Secondary | ICD-10-CM

## 2023-11-08 DIAGNOSIS — M51369 Other intervertebral disc degeneration, lumbar region without mention of lumbar back pain or lower extremity pain: Secondary | ICD-10-CM | POA: Diagnosis not present

## 2023-11-08 DIAGNOSIS — E1165 Type 2 diabetes mellitus with hyperglycemia: Secondary | ICD-10-CM | POA: Diagnosis not present

## 2023-11-08 DIAGNOSIS — G8194 Hemiplegia, unspecified affecting left nondominant side: Secondary | ICD-10-CM | POA: Diagnosis not present

## 2023-11-08 DIAGNOSIS — M47816 Spondylosis without myelopathy or radiculopathy, lumbar region: Secondary | ICD-10-CM | POA: Diagnosis not present

## 2023-11-08 DIAGNOSIS — I10 Essential (primary) hypertension: Secondary | ICD-10-CM | POA: Diagnosis not present

## 2023-11-08 DIAGNOSIS — M48061 Spinal stenosis, lumbar region without neurogenic claudication: Secondary | ICD-10-CM | POA: Diagnosis not present

## 2023-11-08 DIAGNOSIS — Z299 Encounter for prophylactic measures, unspecified: Secondary | ICD-10-CM | POA: Diagnosis not present

## 2023-11-12 ENCOUNTER — Emergency Department (HOSPITAL_COMMUNITY)

## 2023-11-12 ENCOUNTER — Inpatient Hospital Stay (HOSPITAL_COMMUNITY)
Admission: EM | Admit: 2023-11-12 | Discharge: 2023-11-14 | DRG: 057 | Disposition: A | Discharge status: Home Health | Attending: Internal Medicine | Admitting: Internal Medicine

## 2023-11-12 DIAGNOSIS — I6502 Occlusion and stenosis of left vertebral artery: Secondary | ICD-10-CM | POA: Diagnosis not present

## 2023-11-12 DIAGNOSIS — R4701 Aphasia: Secondary | ICD-10-CM | POA: Diagnosis not present

## 2023-11-12 DIAGNOSIS — I6389 Other cerebral infarction: Secondary | ICD-10-CM | POA: Diagnosis not present

## 2023-11-12 DIAGNOSIS — G8191 Hemiplegia, unspecified affecting right dominant side: Secondary | ICD-10-CM | POA: Diagnosis present

## 2023-11-12 DIAGNOSIS — F17210 Nicotine dependence, cigarettes, uncomplicated: Secondary | ICD-10-CM | POA: Diagnosis not present

## 2023-11-12 DIAGNOSIS — I69320 Aphasia following cerebral infarction: Principal | ICD-10-CM

## 2023-11-12 DIAGNOSIS — D72829 Elevated white blood cell count, unspecified: Secondary | ICD-10-CM | POA: Diagnosis not present

## 2023-11-12 DIAGNOSIS — Z79899 Other long term (current) drug therapy: Secondary | ICD-10-CM

## 2023-11-12 DIAGNOSIS — R531 Weakness: Secondary | ICD-10-CM | POA: Diagnosis not present

## 2023-11-12 DIAGNOSIS — Z955 Presence of coronary angioplasty implant and graft: Secondary | ICD-10-CM

## 2023-11-12 DIAGNOSIS — E782 Mixed hyperlipidemia: Secondary | ICD-10-CM | POA: Diagnosis present

## 2023-11-12 DIAGNOSIS — R4182 Altered mental status, unspecified: Secondary | ICD-10-CM | POA: Diagnosis not present

## 2023-11-12 DIAGNOSIS — I252 Old myocardial infarction: Secondary | ICD-10-CM | POA: Diagnosis not present

## 2023-11-12 DIAGNOSIS — I700 Atherosclerosis of aorta: Secondary | ICD-10-CM | POA: Diagnosis not present

## 2023-11-12 DIAGNOSIS — Z87892 Personal history of anaphylaxis: Secondary | ICD-10-CM

## 2023-11-12 DIAGNOSIS — E1165 Type 2 diabetes mellitus with hyperglycemia: Secondary | ICD-10-CM | POA: Diagnosis present

## 2023-11-12 DIAGNOSIS — I10 Essential (primary) hypertension: Secondary | ICD-10-CM | POA: Diagnosis not present

## 2023-11-12 DIAGNOSIS — G459 Transient cerebral ischemic attack, unspecified: Principal | ICD-10-CM | POA: Diagnosis present

## 2023-11-12 DIAGNOSIS — Z885 Allergy status to narcotic agent status: Secondary | ICD-10-CM

## 2023-11-12 DIAGNOSIS — I4819 Other persistent atrial fibrillation: Secondary | ICD-10-CM | POA: Diagnosis present

## 2023-11-12 DIAGNOSIS — Z7984 Long term (current) use of oral hypoglycemic drugs: Secondary | ICD-10-CM

## 2023-11-12 DIAGNOSIS — Z91018 Allergy to other foods: Secondary | ICD-10-CM

## 2023-11-12 DIAGNOSIS — R410 Disorientation, unspecified: Secondary | ICD-10-CM | POA: Diagnosis not present

## 2023-11-12 DIAGNOSIS — Z833 Family history of diabetes mellitus: Secondary | ICD-10-CM

## 2023-11-12 DIAGNOSIS — I639 Cerebral infarction, unspecified: Secondary | ICD-10-CM | POA: Diagnosis not present

## 2023-11-12 DIAGNOSIS — I6782 Cerebral ischemia: Secondary | ICD-10-CM | POA: Diagnosis not present

## 2023-11-12 DIAGNOSIS — I69354 Hemiplegia and hemiparesis following cerebral infarction affecting left non-dominant side: Secondary | ICD-10-CM | POA: Diagnosis not present

## 2023-11-12 DIAGNOSIS — Z7901 Long term (current) use of anticoagulants: Secondary | ICD-10-CM

## 2023-11-12 DIAGNOSIS — Z8049 Family history of malignant neoplasm of other genital organs: Secondary | ICD-10-CM

## 2023-11-12 DIAGNOSIS — I2510 Atherosclerotic heart disease of native coronary artery without angina pectoris: Secondary | ICD-10-CM | POA: Diagnosis not present

## 2023-11-12 DIAGNOSIS — Z803 Family history of malignant neoplasm of breast: Secondary | ICD-10-CM

## 2023-11-12 DIAGNOSIS — Z8249 Family history of ischemic heart disease and other diseases of the circulatory system: Secondary | ICD-10-CM

## 2023-11-12 DIAGNOSIS — Z882 Allergy status to sulfonamides status: Secondary | ICD-10-CM

## 2023-11-12 DIAGNOSIS — Z90710 Acquired absence of both cervix and uterus: Secondary | ICD-10-CM

## 2023-11-12 DIAGNOSIS — I6523 Occlusion and stenosis of bilateral carotid arteries: Secondary | ICD-10-CM | POA: Diagnosis not present

## 2023-11-12 DIAGNOSIS — Z811 Family history of alcohol abuse and dependence: Secondary | ICD-10-CM

## 2023-11-12 DIAGNOSIS — F32A Depression, unspecified: Secondary | ICD-10-CM | POA: Diagnosis present

## 2023-11-12 DIAGNOSIS — Z888 Allergy status to other drugs, medicaments and biological substances status: Secondary | ICD-10-CM

## 2023-11-12 DIAGNOSIS — Z91013 Allergy to seafood: Secondary | ICD-10-CM

## 2023-11-12 DIAGNOSIS — Z880 Allergy status to penicillin: Secondary | ICD-10-CM

## 2023-11-12 DIAGNOSIS — Z981 Arthrodesis status: Secondary | ICD-10-CM | POA: Diagnosis not present

## 2023-11-12 LAB — RAPID URINE DRUG SCREEN, HOSP PERFORMED
Amphetamines: NOT DETECTED
Barbiturates: NOT DETECTED
Benzodiazepines: NOT DETECTED
Cocaine: NOT DETECTED
Opiates: NOT DETECTED
Tetrahydrocannabinol: NOT DETECTED

## 2023-11-12 LAB — URINALYSIS, W/ REFLEX TO CULTURE (INFECTION SUSPECTED)
Bacteria, UA: NONE SEEN
Bilirubin Urine: NEGATIVE
Glucose, UA: NEGATIVE mg/dL
Ketones, ur: NEGATIVE mg/dL
Leukocytes,Ua: NEGATIVE
Nitrite: NEGATIVE
Protein, ur: NEGATIVE mg/dL
Specific Gravity, Urine: 1.011 (ref 1.005–1.030)
pH: 5 (ref 5.0–8.0)

## 2023-11-12 LAB — DIFFERENTIAL
Abs Immature Granulocytes: 0.06 K/uL (ref 0.00–0.07)
Basophils Absolute: 0.1 K/uL (ref 0.0–0.1)
Basophils Relative: 1 %
Eosinophils Absolute: 0 K/uL (ref 0.0–0.5)
Eosinophils Relative: 0 %
Immature Granulocytes: 0 %
Lymphocytes Relative: 13 %
Lymphs Abs: 1.8 K/uL (ref 0.7–4.0)
Monocytes Absolute: 0.9 K/uL (ref 0.1–1.0)
Monocytes Relative: 6 %
Neutro Abs: 10.9 K/uL — ABNORMAL HIGH (ref 1.7–7.7)
Neutrophils Relative %: 80 %

## 2023-11-12 LAB — COMPREHENSIVE METABOLIC PANEL WITH GFR
ALT: 15 U/L (ref 0–44)
AST: 15 U/L (ref 15–41)
Albumin: 4.1 g/dL (ref 3.5–5.0)
Alkaline Phosphatase: 69 U/L (ref 38–126)
Anion gap: 9 (ref 5–15)
BUN: 12 mg/dL (ref 8–23)
CO2: 23 mmol/L (ref 22–32)
Calcium: 9 mg/dL (ref 8.9–10.3)
Chloride: 102 mmol/L (ref 98–111)
Creatinine, Ser: 1.09 mg/dL — ABNORMAL HIGH (ref 0.44–1.00)
GFR, Estimated: 53 mL/min — ABNORMAL LOW (ref >60)
Glucose, Bld: 141 mg/dL — ABNORMAL HIGH (ref 70–99)
Potassium: 4.4 mmol/L (ref 3.5–5.1)
Sodium: 134 mmol/L — ABNORMAL LOW (ref 135–145)
Total Bilirubin: 0.9 mg/dL (ref 0.0–1.2)
Total Protein: 6.8 g/dL (ref 6.5–8.1)

## 2023-11-12 LAB — PROTIME-INR
INR: 1.4 — ABNORMAL HIGH (ref 0.8–1.2)
Prothrombin Time: 18.2 s — ABNORMAL HIGH (ref 11.4–15.2)

## 2023-11-12 LAB — CBC
HCT: 40.9 % (ref 36.0–46.0)
Hemoglobin: 14 g/dL (ref 12.0–15.0)
MCH: 32.6 pg (ref 26.0–34.0)
MCHC: 34.2 g/dL (ref 30.0–36.0)
MCV: 95.1 fL (ref 80.0–100.0)
Platelets: 272 K/uL (ref 150–400)
RBC: 4.3 MIL/uL (ref 3.87–5.11)
RDW: 13.9 % (ref 11.5–15.5)
WBC: 13.7 K/uL — ABNORMAL HIGH (ref 4.0–10.5)
nRBC: 0 % (ref 0.0–0.2)

## 2023-11-12 LAB — ETHANOL: Alcohol, Ethyl (B): <15 mg/dL (ref <15)

## 2023-11-12 LAB — CBG MONITORING, ED: Glucose-Capillary: 140 mg/dL — ABNORMAL HIGH (ref 70–99)

## 2023-11-12 LAB — APTT: aPTT: 31 s (ref 24–36)

## 2023-11-12 MED ORDER — HALOPERIDOL LACTATE 5 MG/ML IJ SOLN
2.0000 mg | Freq: Once | INTRAMUSCULAR | Status: AC
  Administered 2023-11-12: 2 mg via INTRAVENOUS
  Filled 2023-11-12: qty 1

## 2023-11-12 MED ORDER — LORAZEPAM 2 MG/ML IJ SOLN
0.5000 mg | Freq: Once | INTRAMUSCULAR | Status: AC
  Administered 2023-11-12: 0.5 mg via INTRAVENOUS

## 2023-11-12 MED ORDER — LORAZEPAM 2 MG/ML IJ SOLN
1.0000 mg | Freq: Once | INTRAMUSCULAR | Status: AC
  Administered 2023-11-12: 1 mg via INTRAVENOUS

## 2023-11-12 MED ORDER — LORAZEPAM 2 MG/ML PO CONC: 0.5000 mg | Freq: Once | ORAL | Status: DC

## 2023-11-12 MED ORDER — IOHEXOL 350 MG/ML SOLN
75.0000 mL | Freq: Once | INTRAVENOUS | Status: AC | PRN
  Administered 2023-11-12: 100 mL via INTRAVENOUS

## 2023-11-12 MED ORDER — LORAZEPAM 2 MG/ML IJ SOLN
INTRAMUSCULAR | Status: AC
  Filled 2023-11-12: qty 1

## 2023-11-12 MED ORDER — HALOPERIDOL LACTATE 5 MG/ML IJ SOLN
1.0000 mg | Freq: Once | INTRAMUSCULAR | Status: AC
  Administered 2023-11-12: 1 mg via INTRAVENOUS
  Filled 2023-11-12: qty 1

## 2023-11-12 MED ORDER — DIAZEPAM 5 MG/ML IJ SOLN
2.5000 mg | Freq: Once | INTRAMUSCULAR | Status: AC
  Administered 2023-11-12: 2.5 mg via INTRAVENOUS
  Filled 2023-11-12: qty 2

## 2023-11-12 MED ORDER — ACETAMINOPHEN 650 MG RE SUPP
650.0000 mg | Freq: Once | RECTAL | Status: AC
  Administered 2023-11-12: 650 mg via RECTAL
  Filled 2023-11-12: qty 1

## 2023-11-12 NOTE — ED Notes (Signed)
 Delay in obtaining the Ct scans due to the pt is unable to stay still long enough the complete them. Pt was told multiple times to calm down, medication given,  and pt was strapped down to limit movement. It was over 45 mins in attempting the CT scan.

## 2023-11-12 NOTE — ED Notes (Signed)
 Patient transported to CT

## 2023-11-12 NOTE — ED Triage Notes (Signed)
 Pt comes in for AMS/ stroke symptoms. Pt has increased weakness and confusion. Husband states s/s started this morning. Pt is unable to follow commands. Last well known 10 am. Pt is only oriented to self.

## 2023-11-12 NOTE — ED Provider Notes (Signed)
 [place] EMERGENCY DEPARTMENT AT [facility] Provider Note  CSN: [id number] Arrival date & time: 11/12/23 2006  Chief Complaint(s) Code Stroke  HPI Rachel Perkins is a 73 y.o. female history of coronary artery disease, hypertension, hyperlipidemia, prior stroke presenting to the emergency department with altered mental status.  Patient's husband reports that he last saw her this morning at 8 AM normal.  Subsequently has been confused, weak, abnormal speech, not following commands.  No facial droop or focal weakness.  No vomiting, difficulty breathing.  Husband reports that she was complaining of headache earlier in the morning.  History limited as patient unable to provide any history.   Past Medical History Past Medical History:  Diagnosis Date   Anginal pain ([name]) 09/25/2013   Arthritis    CAD (coronary artery disease)    a. 09/25/13 s/p DES to mLCx and DES to pRCA.   Chronic left-sided back pain    has had ESI/Dr. Rosella   DM (diabetes mellitus) ([name])    HTN (hypertension)    Myocardial infarction ([name]) 09/2013   nstemi   Stroke ([name]) 05/11/2018   Tobacco abuse    Patient Active Problem List   Diagnosis Date Noted   Altered mental status 05/26/2022   Hypertensive urgency 05/26/2022   Mixed hyperlipidemia 05/26/2022   Type 2 diabetes mellitus with hyperglycemia ([name]) 05/26/2022   History of stroke 05/26/2022   Colles' fracture of left radius, initial encounter for closed fracture 05/18/2021   Subtherapeutic international normalized ratio (INR)    Acute ischemic right MCA stroke ([name]) 05/18/2018   Chronic pain syndrome    Diabetes mellitus type 2 in [facility])    Sleep disturbance    NSTEMI (non-ST elevated myocardial infarction) ([name]) 09/25/2013   Coronary atherosclerosis of native coronary artery 09/25/2013   Essential hypertension 09/25/2013   Diabetes mellitus ([name]) 09/25/2013   Tobacco abuse 09/25/2013   Home Medication(s) Prior to Admission  medications   Medication Sig Start Date End Date Taking? Authorizing Provider  acetaminophen  (TYLENOL ) 325 MG tablet Take 1-2 tablets (325-650 mg total) by mouth every 4 (four) hours as needed for mild pain. 05/19/18   Love, Sharlet RAMAN, PA-C  atorvastatin  (LIPITOR ) 80 MG tablet Take 80 mg by mouth daily.    [provider]  azelastine (ASTELIN) 0.1 % nasal spray Place 2 sprays into both nostrils 2 (two) times daily as needed for rhinitis or allergies. Use in each nostril as directed    [provider]  calcium -vitamin D  (OSCAL WITH D) 500-200 MG-UNIT tablet Take 1 tablet by mouth daily with breakfast. 05/20/18   Love, Sharlet RAMAN, PA-C  cetirizine (ZYRTEC) 10 MG chewable tablet Chew 10 mg by mouth daily.    [provider]  cholecalciferol (VITAMIN D3) 25 MCG (1000 UNIT) tablet Take 1,000 Units by mouth daily.    [provider]  cyclobenzaprine (FLEXERIL) 10 MG tablet Take 10 mg by mouth 3 (three) times daily as needed for muscle spasms. 05/20/21   [provider]  escitalopram  (LEXAPRO ) 10 MG tablet Take 10 mg by mouth daily. 04/16/21   [provider]  metFORMIN  (GLUCOPHAGE ) 500 MG tablet Take 500 mg by mouth 2 (two) times daily. 12/09/21   [provider]  metoprolol  tartrate (LOPRESSOR ) 25 MG tablet Take 1 tablet (25 mg total) by mouth 2 (two) times daily. 05/27/18   Love, Sharlet RAMAN, PA-C  nitroGLYCERIN  (NITROSTAT ) 0.4 MG SL tablet PLACE 1 TABLET UNDER THE TONGUE EVERY 5 MINUTES FOR  3 DOSES AS NEEDED CHEST PAIN 12/13/17   Charls Pearla LABOR, MD  warfarin (COUMADIN ) 5 MG tablet Take 1 tablet (5 mg total) by mouth daily at 6 PM. 05/27/18   Love, Sharlet RAMAN, PA-C                                                                                                                                    Past Surgical History Past Surgical History:  Procedure Laterality Date   ABDOMINAL HYSTERECTOMY     ANKLE SURGERY Left    APPENDECTOMY     BREAST BIOPSY Right  06/09/2021   BREAST EXCISIONAL BIOPSY Right 02/11/2022   BREAST LUMPECTOMY WITH RADIOACTIVE SEED LOCALIZATION Right 02/11/2022   Procedure: RIGHT BREAST LUMPECTOMY WITH RADIOACTIVE SEED LOCALIZATION;  Surgeon: Vernetta Berg, MD;  Location: [facility] OR;  Service: General;  Laterality: Right;   CERVICAL SPINE SURGERY  2021   ACDF by Dr. Reyes Budge   CORONARY ANGIOPLASTY  09/25/2013   des mid circumflex  des rca   LEFT HEART CATHETERIZATION WITH CORONARY ANGIOGRAM N/A 09/25/2013   Procedure: LEFT HEART CATHETERIZATION WITH CORONARY ANGIOGRAM;  Surgeon: Lonni JONETTA Cash, MD;  Location: [facility] CATH LAB;  Service: Cardiovascular;  Laterality: N/A;   skin grafts right arm     Family History Family History  Problem Relation Age of Onset   Heart failure Father 79   Cirrhosis Mother    Alcohol  abuse Mother    Diabetes Sister    Breast cancer Sister        twin sister   Uterine cancer Sister     Social History Social History   Tobacco Use   Smoking status: Every Day    Current packs/day: 0.50    Average packs/day: 0.5 packs/day for 59.4 years (29.7 ttl pk-yrs)    Types: Cigarettes    Start date: 06/04/1964   Smokeless tobacco: Never  Vaping Use   Vaping status: Never Used  Substance Use Topics   Alcohol  use: No    Alcohol /week: 0.0 standard drinks of alcohol    Drug use: No   Allergies Codeine, Other, Penicillins, Sulfa antibiotics, Lisinopril, and Lorazepam   Review of Systems Review of Systems  Unable to perform ROS: Mental status change    Physical Exam Vital Signs  I have reviewed the triage vital signs BP (!) 108/98   Pulse 69   Temp 100.3 F (37.9 C) (Oral)   Resp 20   SpO2 96%  Physical Exam Vitals and nursing note reviewed.  Constitutional:      General: She is not in acute distress.    Appearance: She is well-developed.  HENT:     Head: Normocephalic and atraumatic.     Mouth/Throat:     Mouth: Mucous membranes are moist.  Eyes:     Pupils: Pupils  are equal, round, and reactive to light.  Cardiovascular:     Rate and Rhythm: Normal rate and  regular rhythm.     Heart sounds: No murmur heard. Pulmonary:     Effort: Pulmonary effort is normal. No respiratory distress.     Breath sounds: Normal breath sounds.  Abdominal:     General: Abdomen is flat.     Palpations: Abdomen is soft.     Tenderness: There is no abdominal tenderness.  Musculoskeletal:        General: No tenderness.     Right lower leg: No edema.     Left lower leg: No edema.  Skin:    General: Skin is warm and dry.  Neurological:     Mental Status: She is alert.     Comments: Patient able to state her name is Yoanna, when asked about other orientation questions she just replies yes.  When asked to follow commands she just replies yes and will follow some commands but only intermittently.  When patient does follow commands, she does move all 4 extremities without obvious focal weakness. CN2-12 seem grossly intact although difficult to assess as patient intermittently follows commands  Psychiatric:        Mood and Affect: Mood normal.        Behavior: Behavior normal.     ED Results and Treatments Labs (all labs ordered are listed, but only abnormal results are displayed) Labs Reviewed  PROTIME-INR - Abnormal; Notable for the following components:      Result Value   Prothrombin Time 18.2 (*)    INR 1.4 (*)    All other components within normal limits  CBC - Abnormal; Notable for the following components:   WBC 13.7 (*)    All other components within normal limits  DIFFERENTIAL - Abnormal; Notable for the following components:   Neutro Abs 10.9 (*)    All other components within normal limits  COMPREHENSIVE METABOLIC PANEL WITH GFR - Abnormal; Notable for the following components:   Sodium 134 (*)    Glucose, Bld 141 (*)    Creatinine, Ser 1.09 (*)    GFR, Estimated 53 (*)    All other components within normal limits  URINALYSIS, W/ REFLEX TO CULTURE  (INFECTION SUSPECTED) - Abnormal; Notable for the following components:   Hgb urine dipstick SMALL (*)    All other components within normal limits  CBG MONITORING, ED - Abnormal; Notable for the following components:   Glucose-Capillary 140 (*)    All other components within normal limits  CULTURE, BLOOD (ROUTINE X 2)  CULTURE, BLOOD (ROUTINE X 2)  ETHANOL  APTT  RAPID URINE DRUG SCREEN, HOSP PERFORMED                                                                                                                          Radiology CT ANGIO HEAD NECK W WO CM W PERF (CODE STROKE) Result Date: 11/12/2023 CLINICAL DATA:  Initial evaluation for acute stroke/TIA. EXAM: CT ANGIOGRAPHY HEAD AND NECK CT PERFUSION BRAIN TECHNIQUE: Multidetector CT imaging of the  head and neck was performed using the standard protocol during bolus administration of intravenous contrast. Multiplanar CT image reconstructions and MIPs were obtained to evaluate the vascular anatomy. Carotid stenosis measurements (when applicable) are obtained utilizing NASCET criteria, using the distal internal carotid diameter as the denominator. Multiphase CT imaging of the brain was performed following IV bolus contrast injection. Subsequent parametric perfusion maps were calculated using RAPID software. RADIATION DOSE REDUCTION: This exam was performed according to the departmental dose-optimization program which includes automated exposure control, adjustment of the mA and/or kV according to patient size and/or use of iterative reconstruction technique. CONTRAST:  100mL OMNIPAQUE  IOHEXOL  350 MG/ML SOLN COMPARISON:  CT from earlier the same day. FINDINGS: CTA NECK FINDINGS Aortic arch: Aortic arch within normal limits for caliber with standard branch pattern. Aortic atherosclerosis. No significant stenosis about the origin the great vessels. Right carotid system: Right common and internal carotid arteries are patent without dissection.  Atheromatous change about the right carotid bulb without hemodynamically significant stenosis. Left carotid system: Left common and internal carotid arteries are patent without dissection. Atheromatous change about the left carotid bulb without hemodynamically significant stenosis. Vertebral arteries: Right vertebral artery dominant and is widely patent without stenosis or dissection. Left vertebral artery arises directly from the aortic arch and is occluded at its origin. Distal reconstitution at the distal left V2 segment with attenuated flow distally to the skull base. Skeleton: No worrisome osseous lesions. Prior ACDF at C5-C7 with solid arthrodesis. Other neck: No other acute finding. Upper chest: No other acute finding. Review of the MIP images confirms the above findings CTA HEAD FINDINGS Anterior circulation: Atheromatous change about the carotid siphons without hemodynamically significant stenosis. A1 segments patent bilaterally. Normal anterior communicating artery complex. Anterior cerebral arteries patent without stenosis. No M1 stenosis or occlusion. Distal MCA branches perfused and symmetric. Posterior circulation: Dominant right V4 segment widely patent. Left V4 segment irregular but patent to the vertebrobasilar junction without significant stenosis. Both PICA patent. Basilar patent without stenosis. Superior cerebellar and posterior cerebral arteries patent bilaterally. Venous sinuses: Patent allowing for timing the contrast bolus. Anatomic variants: As above.  No aneurysm. Review of the MIP images confirms the above findings CT Brain Perfusion Findings: ASPECTS: 10 CBF (<30%) Volume: 0mL Perfusion (Tmax>6.0s) volume: 7mL Mismatch Volume: 7mL Infarction Location:Negative CT perfusion for acute core infarct. Apparent 7 mL area of delayed perfusion largely corresponds with the chronic right MCA territory infarct. No convincing perfusion abnormality. IMPRESSION: 1. Negative CTA for large vessel  occlusion or other emergent finding. 2. Negative CT perfusion with no evidence for acute ischemia. 3. Occlusion of the left vertebral artery at its origin. Distal reconstitution at the left V2 segment with attenuated flow distally to the skull base. Dominant right vertebral artery widely patent. 4. Atheromatous change about the carotid bifurcations and carotid siphons without hemodynamically significant stenosis. 5.  Aortic Atherosclerosis (ICD10-I70.0). Electronically Signed   By: Morene Hoard M.D.   On: 11/12/2023 23:23   CT HEAD CODE STROKE WO CONTRAST Result Date: 11/12/2023 CLINICAL DATA:  Code stroke. Initial evaluation for acute stroke. No other relevant history is provided. EXAM: CT HEAD WITHOUT CONTRAST TECHNIQUE: Contiguous axial images were obtained from the base of the skull through the vertex without intravenous contrast. RADIATION DOSE REDUCTION: This exam was performed according to the departmental dose-optimization program which includes automated exposure control, adjustment of the mA and/or kV according to patient size and/or use of iterative reconstruction technique. COMPARISON:  Prior study from 05/26/2022 FINDINGS: Brain:  Age-related cerebral atrophy with chronic microvascular ischemic disease. Multiple remote infarcts noted involving the right greater than left cerebral hemispheres. No acute intracranial hemorrhage. No acute large vessel territory infarct. No mass lesion or midline shift. No hydrocephalus or extra-axial fluid collection. Vascular: No abnormal hyperdense vessel. Scattered vascular calcifications noted within the carotid siphons. Skull: Scalp soft tissues within normal limits.  Calvarium intact. Sinuses/Orbits: Globes and orbital soft tissues within normal limits. Paranasal sinuses are clear. No mastoid effusion. Other: None. ASPECTS [facility] Stroke Program Early CT Score) - Ganglionic level infarction (caudate, lentiform nuclei, internal capsule, insula, M1-M3 cortex):  7 - Supraganglionic infarction (M4-M6 cortex): 3 Total score (0-10 with 10 being normal): 10 IMPRESSION: 1. No acute intracranial abnormality. 2. Aspects is 10. 3. Multifocal remote infarcts involving the bilateral cerebral hemispheres with underlying chronic microvascular ischemic disease. Results were called by telephone at the time of interpretation on 11/12/2023 at 8:43 pm to provider ELSIE BODY , who verbally acknowledged these results. Electronically Signed   By: Morene Hoard M.D.   On: 11/12/2023 20:43    Pertinent labs & imaging results that were available during my care of the patient were reviewed by me and considered in my medical decision making (see MDM for details).  Medications Ordered in ED Medications  LORazepam  (ATIVAN ) 2 MG/ML injection (  Canceled Entry 11/12/23 2104)  iohexol  (OMNIPAQUE ) 350 MG/ML injection 75 mL (100 mLs Intravenous Contrast Given 11/12/23 2022)  LORazepam  (ATIVAN ) injection 1 mg (1 mg Intravenous Given 11/12/23 2040)  haloperidol  lactate (HALDOL ) injection 1 mg (1 mg Intravenous Given 11/12/23 2102)  LORazepam  (ATIVAN ) injection 0.5 mg (0.5 mg Intravenous Given 11/12/23 2055)  haloperidol  lactate (HALDOL ) injection 2 mg (2 mg Intravenous Given 11/12/23 2145)  diazepam  (VALIUM ) injection 2.5 mg (2.5 mg Intravenous Given 11/12/23 2210)  acetaminophen  (TYLENOL ) suppository 650 mg (650 mg Rectal Given 11/12/23 2310)                                                                                                                                     Procedures Procedures  (including critical care time)  Medical Decision Making / ED Course   MDM:  73 year old presenting with altered mental status.  Last known normal was 8 AM.  Given degree of confusion and abnormal mental status, did activate code stroke although outside TNK window.  Patient does have history of MCA stroke previously.  Patient was complaining of headache earlier however CT head was without  signs of active bleed.  Will check labs to evaluate for metabolic causes of altered mental status.  Anticipate patient will probably need admission ultimately for further management given degree of confusion.  Clinical Course as of 11/12/23 2332  Fri Nov 12, 2023  2205 Delay in CTA due to patient being taken back to the room by nursing as she would not stay still for CTA.  I was not informed.  I instructed nursing  to give additional medication for sedation and proceed with CTA.  Discussed with teleneurology. [WS]  2226 CTA performed, prelim per teleneurology no LVO. Would recommend stroke workup and admission for MRI. Pending formal read.  [WS]  2331 Signed out to Dr. Albertina pending CTA read and admission  [WS]    Clinical Course User Index [WS] Francesca Elsie CROME, MD     Additional history obtained: -Additional history obtained from spouse -External records from outside source obtained and reviewed including: Chart review including previous notes, labs, imaging, consultation notes including prior imaging and notes    Lab Tests: -I ordered, reviewed, and interpreted labs.   The pertinent results include:   Labs Reviewed  PROTIME-INR - Abnormal; Notable for the following components:      Result Value   Prothrombin Time 18.2 (*)    INR 1.4 (*)    All other components within normal limits  CBC - Abnormal; Notable for the following components:   WBC 13.7 (*)    All other components within normal limits  DIFFERENTIAL - Abnormal; Notable for the following components:   Neutro Abs 10.9 (*)    All other components within normal limits  COMPREHENSIVE METABOLIC PANEL WITH GFR - Abnormal; Notable for the following components:   Sodium 134 (*)    Glucose, Bld 141 (*)    Creatinine, Ser 1.09 (*)    GFR, Estimated 53 (*)    All other components within normal limits  URINALYSIS, W/ REFLEX TO CULTURE (INFECTION SUSPECTED) - Abnormal; Notable for the following components:   Hgb urine dipstick  SMALL (*)    All other components within normal limits  CBG MONITORING, ED - Abnormal; Notable for the following components:   Glucose-Capillary 140 (*)    All other components within normal limits  CULTURE, BLOOD (ROUTINE X 2)  CULTURE, BLOOD (ROUTINE X 2)  ETHANOL  APTT  RAPID URINE DRUG SCREEN, HOSP PERFORMED    Notable for mild leukocytosis, subtherapeutic INR, mild hyperglycemia   EKG   EKG Interpretation Date/Time:  Friday November 12 2023 22:23:13 EDT Ventricular Rate:  71 PR Interval:  177 QRS Duration:  123 QT Interval:  430 QTC Calculation: 468 R Axis:   -35  Text Interpretation: Sinus rhythm Right bundle branch block Confirmed by Francesca Elsie ([id number]) on 11/12/2023 10:27:29 PM         Imaging Studies ordered: I ordered imaging studies including CT head, CTA head neck On my interpretation imaging demonstrates no LVO, no ICH I independently visualized and interpreted imaging. I agree with the radiologist interpretation   Medicines ordered and prescription drug management: Meds ordered this encounter  Medications   iohexol  (OMNIPAQUE ) 350 MG/ML injection 75 mL   LORazepam  (ATIVAN ) injection 1 mg   LORazepam  (ATIVAN ) 2 MG/ML injection    Kegley, Lauren L: cabinet override   haloperidol  lactate (HALDOL ) injection 1 mg   DISCONTD: LORazepam  (ATIVAN ) 2 MG/ML concentrated solution 0.5 mg   LORazepam  (ATIVAN ) injection 0.5 mg   haloperidol  lactate (HALDOL ) injection 2 mg   diazepam  (VALIUM ) injection 2.5 mg   acetaminophen  (TYLENOL ) suppository 650 mg    -I have reviewed the patients home medicines and have made adjustments as needed   Consultations Obtained: I requested consultation with the neurologist,  and discussed lab and imaging findings as well as pertinent plan - they recommend: admit for stroke workup   Cardiac Monitoring: The patient was maintained on a cardiac monitor.  I personally viewed and interpreted the cardiac monitored  which showed an  underlying rhythm of: NSR   Reevaluation: After the interventions noted above, I reevaluated the patient and found that their symptoms have stayed the same  Co morbidities that complicate the patient evaluation  Past Medical History:  Diagnosis Date   Anginal pain ([name]) 09/25/2013   Arthritis    CAD (coronary artery disease)    a. 09/25/13 s/p DES to mLCx and DES to pRCA.   Chronic left-sided back pain    has had ESI/Dr. Rosella   DM (diabetes mellitus) ([name])    HTN (hypertension)    Myocardial infarction [facility]) 09/2013   nstemi   Stroke [facility]) 05/11/2018   Tobacco abuse       Dispostion: Disposition decision including need for hospitalization was considered, and patient disposition pending at time of sign out.    Final Clinical Impression(s) / ED Diagnoses Final diagnoses:  Aphasia     This chart was dictated using voice recognition software.  Despite best efforts to proofread,  errors can occur which can change the documentation meaning.    Francesca Elsie CROME, MD 11/12/23 [phone]

## 2023-11-12 NOTE — Consult Note (Addendum)
 [facility] Consult Services   Patient Name:   Rachel Perkins, Rachel Perkins Date of Birth:   06/04/1951 Identification Number:   MRN - [id number] Date of Service:   11/12/2023 20:17:52  Diagnosis:       R47.01 - Aphasia  Impression:      73 yo F who presents with confusion and R leg weakness. On exam NIHSS is 10 notable for aphasia, L gaze preference, and R leg drift. She is out of the window for thrombolytic. CTH shows chronic infarcts but no acute infarct or acute hemorrhage. CTA is pending.    Suspect acute stroke vs metabolic encephalopathy. If CTA is negative for LVO, recommend admission for further stroke workup as below.  Our recommendations are outlined below.  Recommendations:        Stroke/Telemetry Floor       Neuro Checks (Q4)       Bedside Swallow Eval       DVT Prophylaxis       IV Fluids, Normal Saline       Head of Bed 30 Degrees       Euglycemia and Avoid Hyperthermia (PRN Acetaminophen )       Hold Anticoagulation for Now       MRI Brain w/o contrast       TTE       lipid panel, a1c       statin if LDL > 70  Sign Out:       Discussed with Emergency Department Provider    ------------------------------------------------------------------------------  Advanced Imaging: Advanced imaging has been ordered. Results pending. Addendum: CTA Head and Neck with perfusion negative for LVO. The L vertebral artery occlusion was seen on a CTA from 2024, and is likely chronic. This also does not correlate to patient's symptoms or examination.    Metrics: Last Known Well: 11/12/2023 08:00:00 Dispatch Time: 11/12/2023 20:17:52 Arrival Time: 11/12/2023 20:06:00 Initial Response Time: 11/12/2023 20:20:50 Symptoms: confusion, R leg weakness. Initial patient interaction: 11/12/2023 20:33:35 NIHSS Assessment Completed: 11/12/2023 20:37:42 Patient is not a candidate for Thrombolytic. Thrombolytic Medical Decision: 11/12/2023 20:37:43 Patient was not  deemed candidate for Thrombolytic because of following reasons: LKW outside 4.5 hr window. .  CT Head: I personally reviewed all the CT images that were available to me and it showed: chronic R parietal encephalomalacia. Chronic L sided infarcts. No acute hemorrhage or acute core infarct.  Primary Provider Notified of Diagnostic Impression and Management Plan on: 11/12/2023 20:54:10    ------------------------------------------------------------------------------  History of Present Illness: Patient is a 73 year old Female.  Patient was brought by private transportation with symptoms of confusion, R leg weakness. Patient was LSN at 8am. Her husband reports symptoms have been ongoing since then. Patient presents with confusion and R leg weakness.  She has a history of a prior stroke with residual L sided deficits. She is independent at home. She also has a history of afib, takes warfarin.      Past Medical History:      Atrial Fibrillation      Stroke unable to obtain due to:   Patient Is Confused  Medications:  Anticoagulant use:  Yes warfarin No Antiplatelet use Reviewed EMR for current medications  Allergies:  Reviewed Allergies Unable To Obtain Due To: Patient Is Confused  Social History: Unable To Obtain Due To Patient Status : Patient Is Confused  Family History:  Family History Cannot Be Obtained Because:Patient Is Confused  ROS : ROS Cannot Be Obtained Because:  Patient Is Confused  Past Surgical History: Past Surgical History Cannot Be Obtained Because: Patient Is Confused There Is No Surgical History Contributory To Today's Visit     Examination: BP(194/111), Pulse(65), Blood Glucose(141) 1A: Level of Consciousness - Arouses to minor stimulation + 1 1B: Ask Month and Age - Could Not Answer Either Question Correctly + 2 1C: Blink Eyes & Squeeze Hands - Performs 1 Task + 1 2: Test Horizontal Extraocular Movements - Partial Gaze Palsy: Can Be Overcome +  1 3: Test Visual Fields - No Visual Loss + 0 4: Test Facial Palsy (Use Grimace if Obtunded) - Normal symmetry + 0 5A: Test Left Arm Motor Drift - No Drift for 10 Seconds + 0 5B: Test Right Arm Motor Drift - No Drift for 10 Seconds + 0 6A: Test Left Leg Motor Drift - No Drift for 5 Seconds + 0 6B: Test Right Leg Motor Drift - Drift, hits bed + 2 7: Test Limb Ataxia (FNF/Heel-Shin) - No Ataxia + 0 8: Test Sensation - Normal; No sensory loss + 0 9: Test Language/Aphasia - Severe Aphasia: Fragmentary Expression, Inference Needed, Cannot Identify Materials + 2 10: Test Dysarthria - Mild-Moderate Dysarthria: Slurring but can be understood + 1 11: Test Extinction/Inattention - No abnormality + 0  NIHSS Score: 10  NIHSS Free Text : does not follow all commands. No drift noted in the upper extremities. R leg drifted to bed.  Unable to test coordination, visual fields due to AMS. Patient will close eyes intermittently; unable to test BTT.  Pre-Morbid Modified Rankin Scale: 2 Points = Slight disability; unable to carry out all previous activities, but able to look after own affairs without assistance  Spoke with : Dr. Francesca I reviewed the available imaging via Rapid and initiated discussion with the primary provider  This consult was conducted in real time using interactive audio and video technology. Patient was informed of the technology being used for this visit and agreed to proceed. Patient located in hospital and provider located at home/office setting.   Patient is being evaluated for possible acute neurologic impairment and high probability of imminent or life-threatening deterioration. I spent total of 65 minutes providing care to this patient, including time for face to face visit via telemedicine, review of medical records, imaging studies and discussion of findings with providers, the patient and/or family.   Dr Venus Steedman   [facility] For Inpatient follow-up with  [facility] physician please call [facility] at [phone]. As we are not an outpatient service for any post hospital discharge needs please contact the hospital for assistance. If you have any questions for the [facility] physicians or need to reconsult for clinical or diagnostic changes please contact us  via [facility] at [phone].

## 2023-11-13 ENCOUNTER — Encounter (HOSPITAL_COMMUNITY): Payer: Self-pay | Admitting: Internal Medicine

## 2023-11-13 ENCOUNTER — Inpatient Hospital Stay (HOSPITAL_COMMUNITY)

## 2023-11-13 ENCOUNTER — Other Ambulatory Visit (HOSPITAL_COMMUNITY): Payer: Self-pay | Admitting: *Deleted

## 2023-11-13 ENCOUNTER — Observation Stay (HOSPITAL_COMMUNITY)

## 2023-11-13 ENCOUNTER — Other Ambulatory Visit: Payer: Self-pay

## 2023-11-13 DIAGNOSIS — Z7901 Long term (current) use of anticoagulants: Secondary | ICD-10-CM | POA: Diagnosis not present

## 2023-11-13 DIAGNOSIS — G459 Transient cerebral ischemic attack, unspecified: Secondary | ICD-10-CM | POA: Diagnosis present

## 2023-11-13 DIAGNOSIS — I69320 Aphasia following cerebral infarction: Secondary | ICD-10-CM | POA: Diagnosis not present

## 2023-11-13 DIAGNOSIS — I6389 Other cerebral infarction: Secondary | ICD-10-CM | POA: Diagnosis not present

## 2023-11-13 DIAGNOSIS — E782 Mixed hyperlipidemia: Secondary | ICD-10-CM

## 2023-11-13 DIAGNOSIS — I6502 Occlusion and stenosis of left vertebral artery: Secondary | ICD-10-CM | POA: Diagnosis present

## 2023-11-13 DIAGNOSIS — I4819 Other persistent atrial fibrillation: Secondary | ICD-10-CM | POA: Diagnosis present

## 2023-11-13 DIAGNOSIS — I6782 Cerebral ischemia: Secondary | ICD-10-CM | POA: Diagnosis not present

## 2023-11-13 DIAGNOSIS — E1165 Type 2 diabetes mellitus with hyperglycemia: Secondary | ICD-10-CM

## 2023-11-13 DIAGNOSIS — I639 Cerebral infarction, unspecified: Secondary | ICD-10-CM | POA: Diagnosis not present

## 2023-11-13 DIAGNOSIS — D72829 Elevated white blood cell count, unspecified: Secondary | ICD-10-CM | POA: Insufficient documentation

## 2023-11-13 DIAGNOSIS — Z833 Family history of diabetes mellitus: Secondary | ICD-10-CM | POA: Diagnosis not present

## 2023-11-13 DIAGNOSIS — Z87892 Personal history of anaphylaxis: Secondary | ICD-10-CM | POA: Diagnosis not present

## 2023-11-13 DIAGNOSIS — F32A Depression, unspecified: Secondary | ICD-10-CM | POA: Insufficient documentation

## 2023-11-13 DIAGNOSIS — R4701 Aphasia: Secondary | ICD-10-CM | POA: Diagnosis present

## 2023-11-13 DIAGNOSIS — Z7984 Long term (current) use of oral hypoglycemic drugs: Secondary | ICD-10-CM | POA: Diagnosis not present

## 2023-11-13 DIAGNOSIS — R410 Disorientation, unspecified: Secondary | ICD-10-CM | POA: Diagnosis not present

## 2023-11-13 DIAGNOSIS — F17210 Nicotine dependence, cigarettes, uncomplicated: Secondary | ICD-10-CM | POA: Diagnosis present

## 2023-11-13 DIAGNOSIS — Z79899 Other long term (current) drug therapy: Secondary | ICD-10-CM | POA: Diagnosis not present

## 2023-11-13 DIAGNOSIS — R4182 Altered mental status, unspecified: Secondary | ICD-10-CM | POA: Diagnosis present

## 2023-11-13 DIAGNOSIS — Z882 Allergy status to sulfonamides status: Secondary | ICD-10-CM | POA: Diagnosis not present

## 2023-11-13 DIAGNOSIS — I10 Essential (primary) hypertension: Secondary | ICD-10-CM

## 2023-11-13 DIAGNOSIS — R531 Weakness: Secondary | ICD-10-CM | POA: Diagnosis not present

## 2023-11-13 DIAGNOSIS — I69354 Hemiplegia and hemiparesis following cerebral infarction affecting left non-dominant side: Secondary | ICD-10-CM | POA: Diagnosis not present

## 2023-11-13 DIAGNOSIS — G8191 Hemiplegia, unspecified affecting right dominant side: Secondary | ICD-10-CM | POA: Diagnosis present

## 2023-11-13 DIAGNOSIS — Z8249 Family history of ischemic heart disease and other diseases of the circulatory system: Secondary | ICD-10-CM | POA: Diagnosis not present

## 2023-11-13 DIAGNOSIS — Z955 Presence of coronary angioplasty implant and graft: Secondary | ICD-10-CM | POA: Diagnosis not present

## 2023-11-13 DIAGNOSIS — I2510 Atherosclerotic heart disease of native coronary artery without angina pectoris: Secondary | ICD-10-CM | POA: Diagnosis present

## 2023-11-13 DIAGNOSIS — I252 Old myocardial infarction: Secondary | ICD-10-CM | POA: Diagnosis not present

## 2023-11-13 LAB — CBC
HCT: 39.1 % (ref 36.0–46.0)
Hemoglobin: 13.5 g/dL (ref 12.0–15.0)
MCH: 32.5 pg (ref 26.0–34.0)
MCHC: 34.5 g/dL (ref 30.0–36.0)
MCV: 94.2 fL (ref 80.0–100.0)
Platelets: 243 K/uL (ref 150–400)
RBC: 4.15 MIL/uL (ref 3.87–5.11)
RDW: 13.6 % (ref 11.5–15.5)
WBC: 12.3 K/uL — ABNORMAL HIGH (ref 4.0–10.5)
nRBC: 0 % (ref 0.0–0.2)

## 2023-11-13 LAB — GLUCOSE, CAPILLARY
Glucose-Capillary: 118 mg/dL — ABNORMAL HIGH (ref 70–99)
Glucose-Capillary: 112 mg/dL — ABNORMAL HIGH (ref 70–99)
Glucose-Capillary: 139 mg/dL — ABNORMAL HIGH (ref 70–99)
Glucose-Capillary: 145 mg/dL — ABNORMAL HIGH (ref 70–99)
Glucose-Capillary: 122 mg/dL — ABNORMAL HIGH (ref 70–99)

## 2023-11-13 LAB — ECHOCARDIOGRAM COMPLETE
Area-P 1/2: 2.48 cm2
S' Lateral: 2.2 cm

## 2023-11-13 LAB — COMPREHENSIVE METABOLIC PANEL WITH GFR
ALT: 13 U/L (ref 0–44)
AST: 12 U/L — ABNORMAL LOW (ref 15–41)
Albumin: 3.7 g/dL (ref 3.5–5.0)
Alkaline Phosphatase: 64 U/L (ref 38–126)
Anion gap: 7 (ref 5–15)
BUN: 11 mg/dL (ref 8–23)
CO2: 22 mmol/L (ref 22–32)
Calcium: 8.6 mg/dL — ABNORMAL LOW (ref 8.9–10.3)
Chloride: 104 mmol/L (ref 98–111)
Creatinine, Ser: 0.97 mg/dL (ref 0.44–1.00)
GFR, Estimated: >60 mL/min (ref >60)
Glucose, Bld: 118 mg/dL — ABNORMAL HIGH (ref 70–99)
Potassium: 3.7 mmol/L (ref 3.5–5.1)
Sodium: 133 mmol/L — ABNORMAL LOW (ref 135–145)
Total Bilirubin: 0.7 mg/dL (ref 0.0–1.2)
Total Protein: 6.2 g/dL — ABNORMAL LOW (ref 6.5–8.1)

## 2023-11-13 LAB — HEMOGLOBIN A1C
Hgb A1c MFr Bld: 6.8 % — ABNORMAL HIGH (ref 4.8–5.6)
Mean Plasma Glucose: 148.46 mg/dL

## 2023-11-13 LAB — LIPID PANEL
Cholesterol: 90 mg/dL (ref 0–200)
HDL: 45 mg/dL (ref >40)
LDL Cholesterol: 30 mg/dL (ref 0–99)
Total CHOL/HDL Ratio: 2 ratio
Triglycerides: 74 mg/dL (ref <150)
VLDL: 15 mg/dL (ref 0–40)

## 2023-11-13 LAB — PHOSPHORUS: Phosphorus: 3.6 mg/dL (ref 2.5–4.6)

## 2023-11-13 LAB — MAGNESIUM: Magnesium: 1.6 mg/dL — ABNORMAL LOW (ref 1.7–2.4)

## 2023-11-13 MED ORDER — STROKE: EARLY STAGES OF RECOVERY BOOK
Freq: Once | Status: AC
  Administered 2023-11-14: 1

## 2023-11-13 MED ORDER — ONDANSETRON HCL 4 MG/2ML IJ SOLN: 4.0000 mg | Freq: Four times a day (QID) | INTRAMUSCULAR | Status: DC | PRN

## 2023-11-13 MED ORDER — ESCITALOPRAM OXALATE 10 MG PO TABS
10.0000 mg | ORAL_TABLET | Freq: Every day | ORAL | Status: DC
  Administered 2023-11-13 – 2023-11-14 (×2): 10 mg via ORAL
  Filled 2023-11-13 (×2): qty 1

## 2023-11-13 MED ORDER — ACETAMINOPHEN 325 MG PO TABS: 650.0000 mg | ORAL_TABLET | Freq: Four times a day (QID) | ORAL | Status: DC | PRN

## 2023-11-13 MED ORDER — DEXTROSE-SODIUM CHLORIDE 5-0.9 % IV SOLN: INTRAVENOUS | Status: AC

## 2023-11-13 MED ORDER — INSULIN ASPART 100 UNIT/ML IJ SOLN
0.0000 [IU] | INTRAMUSCULAR | Status: DC
  Administered 2023-11-13 – 2023-11-14 (×3): 1 [IU] via SUBCUTANEOUS
  Administered 2023-11-14: 5 [IU] via SUBCUTANEOUS

## 2023-11-13 MED ORDER — ACETAMINOPHEN 650 MG RE SUPP: 650.0000 mg | Freq: Four times a day (QID) | RECTAL | Status: DC | PRN

## 2023-11-13 MED ORDER — ATORVASTATIN CALCIUM 40 MG PO TABS
80.0000 mg | ORAL_TABLET | Freq: Every day | ORAL | Status: DC
  Administered 2023-11-13: 80 mg via ORAL
  Filled 2023-11-13 (×2): qty 2

## 2023-11-13 MED ORDER — DIAZEPAM 2 MG PO TABS
2.0000 mg | ORAL_TABLET | Freq: Once | ORAL | Status: AC | PRN
  Administered 2023-11-13: 2 mg via ORAL
  Filled 2023-11-13: qty 1

## 2023-11-13 MED ORDER — MAGNESIUM SULFATE 2 GM/50ML IV SOLN
2.0000 g | Freq: Once | INTRAVENOUS | Status: AC
  Administered 2023-11-13: 2 g via INTRAVENOUS
  Filled 2023-11-13: qty 50

## 2023-11-13 MED ORDER — ONDANSETRON HCL 4 MG PO TABS: 4.0000 mg | ORAL_TABLET | Freq: Four times a day (QID) | ORAL | Status: DC | PRN

## 2023-11-13 NOTE — H&P (Signed)
 History and Physical    Patient: Rachel Perkins FMW:[id number] DOB: [birth year] DOA: 11/12/2023 DOS: the patient was seen and examined on 11/13/2023 PCP: Maree Isles, MD  Patient coming from: Home  Chief Complaint:  Chief Complaint  Patient presents with   Code Stroke   HPI: Rachel Perkins is a 73 y.o. female with medical history significant of hypertension, hyperlipidemia, CAD s/p stent placement, T2DM, prior stroke who presents to the emergency department due to confusion and right leg weakness.  Patient was unable to provide history possibly due to ongoing altered mental status, history was obtained from the EDP and ED medical record.  Per report last known well was 8 AM on 7//25, but she has presented with confusion, abnormal speech, weakness and has not been following commands since then.  She was reported to complain of headache earlier in the morning.  No further history was obtainable at this time.  ED Course:  In the emergency department, temperature was 96.54F.  BP was 194/111.  Workup in the ED showed normal CBC except for WBC of 13.7.  BMP was normal except for sodium of 124, blood glucose 141, creatinine 1.09.  Urinalysis was normal, urine drug screen was normal, alcohol  level was less than 15.  Blood culture pending. CT head without contrast showed no acute intracranial abnormality, but showed multifocal remote infarcts involving the bilateral cerebral hemispheres with underlying chronic microvascular ischemic disease. CT angiography head and neck showed negative CTA for large vessel occlusion or other emergent finding.  Negative CT perfusion with no evidence for acute ischemia. Occlusion of the left vertebral artery at its origin. Distal reconstitution at the left V2 segment with attenuated flow distally to the skull base. Dominant right vertebral artery widely patent. Ativan , Valium , Haldol  and Tylenol  were given. Telemetry neurology was consulted and suspected acute stroke vs  metabolic encephalopathy and recommended further stroke workup.  [facility] was asked to admit patient  Review of Systems: Review of systems as noted in the HPI. All other systems reviewed and are negative.   Past Medical History:  Diagnosis Date   Anginal pain ([name]) 09/25/2013   Arthritis    CAD (coronary artery disease)    a. 09/25/13 s/p DES to mLCx and DES to pRCA.   Chronic left-sided back pain    has had ESI/Dr. Rosella   DM (diabetes mellitus) ([name])    HTN (hypertension)    Myocardial infarction ([name]) 09/2013   nstemi   Stroke ([name]) 05/11/2018   Tobacco abuse    Past Surgical History:  Procedure Laterality Date   ABDOMINAL HYSTERECTOMY     ANKLE SURGERY Left    APPENDECTOMY     BREAST BIOPSY Right 06/09/2021   BREAST EXCISIONAL BIOPSY Right 02/11/2022   BREAST LUMPECTOMY WITH RADIOACTIVE SEED LOCALIZATION Right 02/11/2022   Procedure: RIGHT BREAST LUMPECTOMY WITH RADIOACTIVE SEED LOCALIZATION;  Surgeon: Vernetta Berg, MD;  Location: [facility] OR;  Service: General;  Laterality: Right;   CERVICAL SPINE SURGERY  2021   ACDF by Dr. Reyes Budge   CORONARY ANGIOPLASTY  09/25/2013   des mid circumflex  des rca   LEFT HEART CATHETERIZATION WITH CORONARY ANGIOGRAM N/A 09/25/2013   Procedure: LEFT HEART CATHETERIZATION WITH CORONARY ANGIOGRAM;  Surgeon: Lonni JONETTA Cash, MD;  Location: [facility] CATH LAB;  Service: Cardiovascular;  Laterality: N/A;   skin grafts right arm      Social History:  reports that she has been smoking cigarettes. She started smoking about 59 years ago. She has a  29.7 pack-year smoking history. She has never used smokeless tobacco. She reports that she does not drink alcohol  and does not use drugs.   Allergies  Allergen Reactions   Codeine Itching    Pt says she can take    Other Anaphylaxis    NUTS   LOBSTER - causes itching      Penicillins Rash    DID THE REACTION INVOLVE: Swelling of the face/tongue/throat, SOB, or low BP? Unknown Sudden or  severe rash/hives, skin peeling, or the inside of the mouth or nose? Unknown Did it require medical treatment? Unknown When did it last happen?      unk If all above answers are "NO", may proceed with cephalosporin use.    Sulfa Antibiotics Rash   Lisinopril Cough   Lorazepam  Other (See Comments)    Confusion  Hyper  Other Reaction(s): Confusion  Other reaction(s): Confusion  Hyper  Other reaction(s): Confusion Hyper    Hyper    Family History  Problem Relation Age of Onset   Heart failure Father 24   Cirrhosis Mother    Alcohol  abuse Mother    Diabetes Sister    Breast cancer Sister        twin sister   Uterine cancer Sister      Prior to Admission medications   Medication Sig Start Date End Date Taking? Authorizing Provider  acetaminophen  (TYLENOL ) 325 MG tablet Take 1-2 tablets (325-650 mg total) by mouth every 4 (four) hours as needed for mild pain. 05/19/18   Love, Sharlet RAMAN, PA-C  atorvastatin  (LIPITOR ) 80 MG tablet Take 80 mg by mouth daily.    [provider]  azelastine (ASTELIN) 0.1 % nasal spray Place 2 sprays into both nostrils 2 (two) times daily as needed for rhinitis or allergies. Use in each nostril as directed    [provider]  calcium -vitamin D  (OSCAL WITH D) 500-200 MG-UNIT tablet Take 1 tablet by mouth daily with breakfast. 05/20/18   Love, Sharlet RAMAN, PA-C  cetirizine (ZYRTEC) 10 MG chewable tablet Chew 10 mg by mouth daily.    [provider]  cholecalciferol (VITAMIN D3) 25 MCG (1000 UNIT) tablet Take 1,000 Units by mouth daily.    [provider]  cyclobenzaprine (FLEXERIL) 10 MG tablet Take 10 mg by mouth 3 (three) times daily as needed for muscle spasms. 05/20/21   [provider]  escitalopram  (LEXAPRO ) 10 MG tablet Take 10 mg by mouth daily. 04/16/21   [provider]  metFORMIN  (GLUCOPHAGE ) 500 MG tablet Take 500 mg by mouth 2 (two) times daily. 12/09/21   [provider]  metoprolol   tartrate (LOPRESSOR ) 25 MG tablet Take 1 tablet (25 mg total) by mouth 2 (two) times daily. 05/27/18   Love, Sharlet RAMAN, PA-C  nitroGLYCERIN  (NITROSTAT ) 0.4 MG SL tablet PLACE 1 TABLET UNDER THE TONGUE EVERY 5 MINUTES FOR 3 DOSES AS NEEDED CHEST PAIN 12/13/17   Charls Pearla LABOR, MD  warfarin (COUMADIN ) 5 MG tablet Take 1 tablet (5 mg total) by mouth daily at 6 PM. 05/27/18   Love, Sharlet RAMAN, PA-C    Physical Exam: BP (!) 155/100   Pulse 65   Temp 98.7 F (37.1 C) (Oral)   Resp 20   SpO2 98%   General: 73 y.o. year-old female well developed well nourished in no acute distress.  Alert and oriented x 1 (self). HEENT: NCAT, EOMI Neck: Supple, trachea medial Cardiovascular: Regular rate and rhythm with no rubs or gallops.  No thyromegaly  or JVD noted.  No lower extremity edema. 2/4 pulses in all 4 extremities. Respiratory: Clear to auscultation with no wheezes or rales. Good inspiratory effort. Abdomen: Soft, nontender nondistended with normal bowel sounds x4 quadrants. Muskuloskeletal: No cyanosis, clubbing or edema noted bilaterally Neuro: Follows commands intermittently only.  Moves all 4 extremities.  Sensation, reflexes intact Skin: No ulcerative lesions noted or rashes Psychiatry: Mood is appropriate for condition and setting          Labs on Admission:  Basic Metabolic Panel: Recent Labs  Lab 11/12/23 2019  NA 134*  K 4.4  CL 102  CO2 23  GLUCOSE 141*  BUN 12  CREATININE 1.09*  CALCIUM  9.0   Liver Function Tests: Recent Labs  Lab 11/12/23 2019  AST 15  ALT 15  ALKPHOS 69  BILITOT 0.9  PROT 6.8  ALBUMIN 4.1   No results for input(s): LIPASE, AMYLASE in the last 168 hours. No results for input(s): AMMONIA in the last 168 hours. CBC: Recent Labs  Lab 11/12/23 2019  WBC 13.7*  NEUTROABS 10.9*  HGB 14.0  HCT 40.9  MCV 95.1  PLT 272   Cardiac Enzymes: No results for input(s): CKTOTAL, CKMB, CKMBINDEX, TROPONINI in the last 168 hours.  BNP  (last 3 results) No results for input(s): BNP in the last 8760 hours.  ProBNP (last 3 results) No results for input(s): PROBNP in the last 8760 hours.  CBG: Recent Labs  Lab 11/12/23 2009  GLUCAP 140*    Radiological Exams on Admission: DG Chest Portable 1 View Result Date: 11/13/2023 CLINICAL DATA:  Suspicion for pneumonia. Increased weakness and confusion starting this morning. EXAM: PORTABLE CHEST 1 VIEW COMPARISON:  05/25/2022 FINDINGS: Patient rotation limits examination. As visualized, the heart size and pulmonary vascularity appear normal. The lungs appear clear and expanded. No pleural effusion or pneumothorax. Postoperative changes in the cervical spine. IMPRESSION: Patient rotation limits examination. No evidence of active pulmonary disease. Electronically Signed   By: Elsie Gravely M.D.   On: 11/13/2023 00:09   CT ANGIO HEAD NECK W WO CM W PERF (CODE STROKE) Result Date: 11/12/2023 CLINICAL DATA:  Initial evaluation for acute stroke/TIA. EXAM: CT ANGIOGRAPHY HEAD AND NECK CT PERFUSION BRAIN TECHNIQUE: Multidetector CT imaging of the head and neck was performed using the standard protocol during bolus administration of intravenous contrast. Multiplanar CT image reconstructions and MIPs were obtained to evaluate the vascular anatomy. Carotid stenosis measurements (when applicable) are obtained utilizing NASCET criteria, using the distal internal carotid diameter as the denominator. Multiphase CT imaging of the brain was performed following IV bolus contrast injection. Subsequent parametric perfusion maps were calculated using RAPID software. RADIATION DOSE REDUCTION: This exam was performed according to the departmental dose-optimization program which includes automated exposure control, adjustment of the mA and/or kV according to patient size and/or use of iterative reconstruction technique. CONTRAST:  OMNIPAQUE  IOHEXOL  350 MG/ML SOLN COMPARISON:  CT from earlier the same day.  FINDINGS: CTA NECK FINDINGS Aortic arch: Aortic arch within normal limits for caliber with standard branch pattern. Aortic atherosclerosis. No significant stenosis about the origin the great vessels. Right carotid system: Right common and internal carotid arteries are patent without dissection. Atheromatous change about the right carotid bulb without hemodynamically significant stenosis. Left carotid system: Left common and internal carotid arteries are patent without dissection. Atheromatous change about the left carotid bulb without hemodynamically significant stenosis. Vertebral arteries: Right vertebral artery dominant and is widely patent without stenosis or dissection. Left vertebral artery  arises directly from the aortic arch and is occluded at its origin. Distal reconstitution at the distal left V2 segment with attenuated flow distally to the skull base. Skeleton: No worrisome osseous lesions. Prior ACDF at C5-C7 with solid arthrodesis. Other neck: No other acute finding. Upper chest: No other acute finding. Review of the MIP images confirms the above findings CTA HEAD FINDINGS Anterior circulation: Atheromatous change about the carotid siphons without hemodynamically significant stenosis. A1 segments patent bilaterally. Normal anterior communicating artery complex. Anterior cerebral arteries patent without stenosis. No M1 stenosis or occlusion. Distal MCA branches perfused and symmetric. Posterior circulation: Dominant right V4 segment widely patent. Left V4 segment irregular but patent to the vertebrobasilar junction without significant stenosis. Both PICA patent. Basilar patent without stenosis. Superior cerebellar and posterior cerebral arteries patent bilaterally. Venous sinuses: Patent allowing for timing the contrast bolus. Anatomic variants: As above.  No aneurysm. Review of the MIP images confirms the above findings CT Brain Perfusion Findings: ASPECTS: 10 CBF (<30%) Volume: 0mL Perfusion  (Tmax>6.0s) volume: 7mL Mismatch Volume: 7mL Infarction Location:Negative CT perfusion for acute core infarct. Apparent 7 mL area of delayed perfusion largely corresponds with the chronic right MCA territory infarct. No convincing perfusion abnormality. IMPRESSION: 1. Negative CTA for large vessel occlusion or other emergent finding. 2. Negative CT perfusion with no evidence for acute ischemia. 3. Occlusion of the left vertebral artery at its origin. Distal reconstitution at the left V2 segment with attenuated flow distally to the skull base. Dominant right vertebral artery widely patent. 4. Atheromatous change about the carotid bifurcations and carotid siphons without hemodynamically significant stenosis. 5.  Aortic Atherosclerosis (ICD10-I70.0). Electronically Signed   By: Morene Hoard M.D.   On: 11/12/2023 23:23   CT HEAD CODE STROKE WO CONTRAST Result Date: 11/12/2023 CLINICAL DATA:  Code stroke. Initial evaluation for acute stroke. No other relevant history is provided. EXAM: CT HEAD WITHOUT CONTRAST TECHNIQUE: Contiguous axial images were obtained from the base of the skull through the vertex without intravenous contrast. RADIATION DOSE REDUCTION: This exam was performed according to the departmental dose-optimization program which includes automated exposure control, adjustment of the mA and/or kV according to patient size and/or use of iterative reconstruction technique. COMPARISON:  Prior study from 05/26/2022 FINDINGS: Brain: Age-related cerebral atrophy with chronic microvascular ischemic disease. Multiple remote infarcts noted involving the right greater than left cerebral hemispheres. No acute intracranial hemorrhage. No acute large vessel territory infarct. No mass lesion or midline shift. No hydrocephalus or extra-axial fluid collection. Vascular: No abnormal hyperdense vessel. Scattered vascular calcifications noted within the carotid siphons. Skull: Scalp soft tissues within normal limits.   Calvarium intact. Sinuses/Orbits: Globes and orbital soft tissues within normal limits. Paranasal sinuses are clear. No mastoid effusion. Other: None. ASPECTS [facility] Stroke Program Early CT Score) - Ganglionic level infarction (caudate, lentiform nuclei, internal capsule, insula, M1-M3 cortex): 7 - Supraganglionic infarction (M4-M6 cortex): 3 Total score (0-10 with 10 being normal): 10 IMPRESSION: 1. No acute intracranial abnormality. 2. Aspects is 10. 3. Multifocal remote infarcts involving the bilateral cerebral hemispheres with underlying chronic microvascular ischemic disease. Results were called by telephone at the time of interpretation on 11/12/2023 at 8:43 pm to provider ELSIE BODY , who verbally acknowledged these results. Electronically Signed   By: Morene Hoard M.D.   On: 11/12/2023 20:43    EKG: I independently viewed the EKG done and my findings are as followed: Normal sinus rhythm at a rate of 65 bpm  Assessment/Plan Present on Admission:  Aphasia  Essential hypertension  Mixed hyperlipidemia  Type 2 diabetes mellitus with hyperglycemia ([name])  Principal Problem:   Aphasia Active Problems:   Essential hypertension   Mixed hyperlipidemia   Type 2 diabetes mellitus with hyperglycemia ([name])   Leukocytosis   Persistent atrial fibrillation ([name])   Depression  Aphasia r/o acute ischemic stroke Patient will be admitted to telemetry unit  CT head without contrast showed no acute intracranial abnormality CT angiography head and neck showed negative CTA for large vessel occlusion Echocardiogram in the morning Rod in neck may not be compatible with MRI of brain without contrast Repeat CT of head without contrast 24 hours after initial CT Continue fall precautions and neuro checks Lipid panel and hemoglobin A1c will be checked Continue PT/SLP/OT eval and treat Bedside swallow eval by nursing prior to diet Consider neurology consult status post imaging  studies  Leukocytosis (chronic) WBC 13.7, continue to monitor WBC with morning labs  Persistent atrial fibrillation EKG personally reviewed showed normal sinus rhythm at a rate of 60 bpm Warfarin will be held pending repeat CT of head without contrast Metoprolol  will be held at this time due to permissive hypertension Continue telemetry  Essential hypertension  Antihypertensives PRN if Blood pressure is greater than 220/120 or there is a concern for End organ damage/contraindications for permissive HTN. If blood pressure is greater than 220/120 give labetalol  PO or IV or Vasotec IV with a goal of 15% reduction in BP during the first 24 hours.  Mixed hyperlipidemia Continue atorvastatin    Type 2 diabetes mellitus with hyperglycemia Hemoglobin A1c on 01/25/2023 was 6.9 - well-controlled.  Continue ISS and hypoglycemia protocol    Depression Continue Lexapro    DVT prophylaxis: SCDs  Code Status: Full code  Family Communication: None at bedside  Consults: None  Severity of Illness: The appropriate patient status for this patient is OBSERVATION. Observation status is judged to be reasonable and necessary in order to provide the required intensity of service to ensure the patient's safety. The patient's presenting symptoms, physical exam findings, and initial radiographic and laboratory data in the context of their medical condition is felt to place them at decreased risk for further clinical deterioration. Furthermore, it is anticipated that the patient will be medically stable for discharge from the hospital within 2 midnights of admission.   Author: Jamaya Sleeth, DO 11/13/2023 2:58 AM  For on call review [email or web address].

## 2023-11-13 NOTE — Plan of Care (Signed)

## 2023-11-13 NOTE — Care Management Obs Status (Signed)
 MEDICARE OBSERVATION STATUS NOTIFICATION   Patient Details  Name: Rachel Perkins MRN: [id number] Date of Birth: April 18, 1951   Medicare Observation Status Notification Given:  Yes    Nena LITTIE Coffee, RN 11/13/2023, 1:17 PM

## 2023-11-13 NOTE — ED Notes (Signed)
 Unable to complete NIH due to pt sleeping

## 2023-11-13 NOTE — Progress Notes (Signed)
*  PRELIMINARY RESULTS* Echocardiogram 2D Echocardiogram has been performed.  Rachel Perkins 11/13/2023, 11:10 AM

## 2023-11-13 NOTE — Evaluation (Signed)
 Physical Therapy Evaluation Patient Details Name: Rachel Perkins MRN: [id number] DOB: Feb 02, 1951 Today's Date: 11/13/2023  History of Present Illness  Rachel Perkins is a 73 y.o. female with medical history significant of hypertension, hyperlipidemia, CAD s/p stent placement, T2DM, prior stroke who presents to the emergency department due to confusion and right leg weakness.  Patient was unable to provide history possibly due to ongoing altered mental status, history was obtained from the EDP and ED medical record.  Per report last known well was 8 AM on 7//25, but she has presented with confusion, abnormal speech, weakness and has not been following commands since then.  She was reported to complain of headache earlier in the morning.  No further history was obtainable at this time.   Clinical Impression  Patient functioning near baseline for functional mobility and gait without an AD, but has to lean on walls, nearby objects for support once fatigued requiring hand held assist for safety. Patient tolerated sitting up in chair after therapy. Patient will benefit from continued skilled physical therapy in hospital and recommended venue below to increase strength, balance, endurance for safe ADLs and gait.           If plan is discharge home, recommend the following: A little help with walking and/or transfers;A little help with bathing/dressing/bathroom;Help with stairs or ramp for entrance;Assistance with cooking/housework   Can travel by private vehicle        Equipment Recommendations None recommended by PT  Recommendations for Other Services       Functional Status Assessment Patient has had a recent decline in their functional status and demonstrates the ability to make significant improvements in function in a reasonable and predictable amount of time.     Precautions / Restrictions Precautions Precautions: Fall Recall of Precautions/Restrictions: Intact Restrictions Weight  Bearing Restrictions Per Provider Order: No      Mobility  Bed Mobility Overal bed mobility: Needs Assistance Bed Mobility: Supine to Sit     Supine to sit: Supervision, Contact guard     General bed mobility comments: increased time, labored movement    Transfers Overall transfer level: Needs assistance Equipment used: None Transfers: Sit to/from Stand, Bed to chair/wheelchair/BSC Sit to Stand: Contact guard assist   Step pivot transfers: Contact guard assist       General transfer comment: slightly labored movement without AD    Ambulation/Gait Ambulation/Gait assistance: Supervision, Contact guard assist Gait Distance (Feet): 75 Feet Assistive device: None, 1 person hand held assist Gait Pattern/deviations: Decreased step length - right, Decreased step length - left, Decreased stride length, Staggering left, Staggering right Gait velocity: decreased     General Gait Details: slightly labored movement requiring verbal cues to let arms swinig with fair carryover, once fatigued became unsteady requiring leaning on wall and hand held assist for safety  Stairs            Wheelchair Mobility     Tilt Bed    Modified Rankin (Stroke Patients Only)       Balance Overall balance assessment: Needs assistance Sitting-balance support: Feet supported, No upper extremity supported Sitting balance-Leahy Scale: Good Sitting balance - Comments: seated at EOB   Standing balance support: During functional activity, No upper extremity supported Standing balance-Leahy Scale: Poor Standing balance comment: fair/poor                             Pertinent Vitals/Pain Pain Assessment Pain Assessment:  No/denies pain    Home Living Family/patient expects to be discharged to:: Private residence Living Arrangements: Spouse/significant other Available Help at Discharge: Family;Available PRN/intermittently Type of Home: House Home Access: Level entry        Home Layout: One level Home Equipment: Shower seat - built in;Cane - single point;BSC/3in1;Grab bars - tub/shower      Prior Function Prior Level of Function : Independent/Modified Independent             Mobility Comments: Tourist information centre manager without AD ADLs Comments: Independent, drives     Extremity/Trunk Assessment   Upper Extremity Assessment Upper Extremity Assessment: Defer to OT evaluation    Lower Extremity Assessment Lower Extremity Assessment: Overall WFL for tasks assessed    Cervical / Trunk Assessment Cervical / Trunk Assessment: Normal  Communication   Communication Communication: No apparent difficulties    Cognition Arousal: Alert Behavior During Therapy: WFL for tasks assessed/performed   PT - Cognitive impairments: No apparent impairments                         Following commands: Intact       Cueing Cueing Techniques: Verbal cues     General Comments      Exercises     Assessment/Plan    PT Assessment Patient needs continued PT services  PT Problem List Decreased strength;Decreased activity tolerance;Decreased balance;Decreased mobility       PT Treatment Interventions DME instruction;Gait training;Stair training;Functional mobility training;Therapeutic activities;Therapeutic exercise;Balance training;Patient/family education    PT Goals (Current goals can be found in the Care Plan section)  Acute Rehab PT Goals Patient Stated Goal: return home with family to assist PT Goal Formulation: With patient Time For Goal Achievement: 11/16/23 Potential to Achieve Goals: Good    Frequency Min 3X/week     Co-evaluation               AM-PAC PT 6 Clicks Mobility  Outcome Measure Help needed turning from your back to your side while in a flat bed without using bedrails?: None Help needed moving from lying on your back to sitting on the side of a flat bed without using bedrails?: A Little Help needed moving to and  from a bed to a chair (including a wheelchair)?: A Little Help needed standing up from a chair using your arms (e.g., wheelchair or bedside chair)?: A Little Help needed to walk in hospital room?: A Little Help needed climbing 3-5 steps with a railing? : A Little 6 Click Score: 19    End of Session   Activity Tolerance: Patient tolerated treatment well;Patient limited by fatigue Patient left: in chair;with call bell/phone within reach;with chair alarm set Nurse Communication: Mobility status PT Visit Diagnosis: Unsteadiness on feet (R26.81);Other abnormalities of gait and mobility (R26.89);Muscle weakness (generalized) (M62.81)    Time: 8799-8779 PT Time Calculation (min) (ACUTE ONLY): 20 min   Charges:   PT Evaluation $PT Eval Moderate Complexity: 1 Mod PT Treatments $Therapeutic Activity: 8-22 mins PT General Charges $$ ACUTE PT VISIT: 1 Visit         3:33 PM, 11/13/23 Lynwood Music, MPT Physical Therapist with [facility] [phone] office [phone] mobile phone

## 2023-11-13 NOTE — Progress Notes (Signed)
 Pt presents to [address] with altered mental status, unable to complete admission database at this time.

## 2023-11-13 NOTE — Plan of Care (Signed)
  Problem: Acute Rehab PT Goals(only PT should resolve) Goal: Pt Will Go Supine/Side To Sit Outcome: Progressing Flowsheets (Taken 11/13/2023 1534) Pt will go Supine/Side to Sit: Independently Goal: Patient Will Transfer Sit To/From Stand Outcome: Progressing Flowsheets (Taken 11/13/2023 1534) Patient will transfer sit to/from stand: Independently Goal: Pt Will Transfer Bed To Chair/Chair To Bed Outcome: Progressing Flowsheets (Taken 11/13/2023 1534) Pt will Transfer Bed to Chair/Chair to Bed:  with modified independence  Independently Goal: Pt Will Ambulate Outcome: Progressing Flowsheets (Taken 11/13/2023 1534) Pt will Ambulate:  > 125 feet  with modified independence  with least restrictive assistive device   3:34 PM, 11/13/23 Lynwood Music, MPT Physical Therapist with [facility] [phone] office [phone] mobile phone

## 2023-11-13 NOTE — Plan of Care (Signed)
   Problem: Health Behavior/Discharge Planning: Goal: Ability to manage health-related needs will improve Outcome: Progressing

## 2023-11-13 NOTE — Progress Notes (Signed)
   11/13/23 1718  TOC Brief Assessment  Insurance and Status Reviewed  Patient has primary care physician Yes  Home environment has been reviewed From home  Prior level of function: Independent  Prior/Current Home Services No current home services  Social Drivers of Health Review SDOH reviewed interventions complete (smoking cessation added to AVS)  Readmission risk has been reviewed Yes  Transition of care needs transition of care needs identified, TOC will continue to follow   HHPT recommended.

## 2023-11-13 NOTE — Progress Notes (Signed)
 Rachel Perkins is a 73 y.o. female with medical history significant of hypertension, hyperlipidemia, CAD s/p stent placement, T2DM, prior stroke who presents to the emergency department due to confusion and right leg weakness.  Patient was unable to provide history possibly due to ongoing altered mental status, history was obtained from the EDP and ED medical record.  Per report last known well was 8 AM on 7//25, but she has presented with confusion, abnormal speech, weakness and has not been following commands since then.   CT head showed no acute intracranial abnormality and CTA was performed with no LVO.  Repeat head CT has been planned for 24 hours from the initial which will be on 7/5 at 2000 since her rod in the neck may not be compatible with MRI.  2D echocardiogram as well as other testing is still pending.  She remains n.p.o. and has been started on IV fluids since she did not pass bedside swallow evaluation and SLP evaluation pending.  Anticipate discharge in a.m. after evaluations are complete and after discussion with neurology.  Patient seen and evaluated at bedside and has been admitted after midnight.  Total care time: 30 minutes.

## 2023-11-14 DIAGNOSIS — R4701 Aphasia: Secondary | ICD-10-CM | POA: Diagnosis not present

## 2023-11-14 LAB — BASIC METABOLIC PANEL WITH GFR
Anion gap: 8 (ref 5–15)
BUN: 12 mg/dL (ref 8–23)
CO2: 21 mmol/L — ABNORMAL LOW (ref 22–32)
Calcium: 8.6 mg/dL — ABNORMAL LOW (ref 8.9–10.3)
Chloride: 107 mmol/L (ref 98–111)
Creatinine, Ser: 0.96 mg/dL (ref 0.44–1.00)
GFR, Estimated: >60 mL/min (ref >60)
Glucose, Bld: 110 mg/dL — ABNORMAL HIGH (ref 70–99)
Potassium: 3.6 mmol/L (ref 3.5–5.1)
Sodium: 136 mmol/L (ref 135–145)

## 2023-11-14 LAB — MAGNESIUM: Magnesium: 2.1 mg/dL (ref 1.7–2.4)

## 2023-11-14 LAB — CBC
HCT: 38.3 % (ref 36.0–46.0)
Hemoglobin: 12.9 g/dL (ref 12.0–15.0)
MCH: 31.5 pg (ref 26.0–34.0)
MCHC: 33.7 g/dL (ref 30.0–36.0)
MCV: 93.6 fL (ref 80.0–100.0)
Platelets: 235 K/uL (ref 150–400)
RBC: 4.09 MIL/uL (ref 3.87–5.11)
RDW: 13.7 % (ref 11.5–15.5)
WBC: 10.2 K/uL (ref 4.0–10.5)
nRBC: 0 % (ref 0.0–0.2)

## 2023-11-14 LAB — GLUCOSE, CAPILLARY
Glucose-Capillary: 110 mg/dL — ABNORMAL HIGH (ref 70–99)
Glucose-Capillary: 118 mg/dL — ABNORMAL HIGH (ref 70–99)
Glucose-Capillary: 122 mg/dL — ABNORMAL HIGH (ref 70–99)
Glucose-Capillary: 283 mg/dL — ABNORMAL HIGH (ref 70–99)

## 2023-11-14 MED ORDER — ATORVASTATIN CALCIUM 40 MG PO TABS: 40.0000 mg | ORAL_TABLET | Freq: Every day | ORAL | 0 refills | Status: AC

## 2023-11-14 MED ORDER — ATORVASTATIN CALCIUM 40 MG PO TABS
40.0000 mg | ORAL_TABLET | Freq: Every day | ORAL | Status: DC
  Administered 2023-11-14: 40 mg via ORAL

## 2023-11-14 MED ORDER — ASPIRIN 81 MG PO TBEC: 81.0000 mg | DELAYED_RELEASE_TABLET | Freq: Every day | ORAL | Status: DC

## 2023-11-14 MED ORDER — WARFARIN - PHYSICIAN DOSING INPATIENT: Freq: Every day | Status: DC

## 2023-11-14 MED ORDER — WARFARIN SODIUM 5 MG PO TABS: 5.0000 mg | ORAL_TABLET | Freq: Every day | ORAL | Status: DC

## 2023-11-14 MED ORDER — CLOPIDOGREL BISULFATE 75 MG PO TABS: 75.0000 mg | ORAL_TABLET | Freq: Every day | ORAL | Status: DC

## 2023-11-14 NOTE — Evaluation (Signed)
 Speech Language Pathology Evaluation Patient Details Name: Rachel Perkins MRN: [id number] DOB: Apr 10, 1951 Today's Date: 11/14/2023 Time: 9056-9040 SLP Time Calculation (min) (ACUTE ONLY): 16 min  Problem List:  Patient Active Problem List   Diagnosis Date Noted   Aphasia 11/13/2023   Leukocytosis 11/13/2023   Persistent atrial fibrillation ([name]) 11/13/2023   Depression 11/13/2023   Altered mental status 05/26/2022   Hypertensive urgency 05/26/2022   Mixed hyperlipidemia 05/26/2022   Type 2 diabetes mellitus with hyperglycemia ([name]) 05/26/2022   History of stroke 05/26/2022   Colles' fracture of left radius, initial encounter for closed fracture 05/18/2021   Subtherapeutic international normalized ratio (INR)    Acute ischemic right MCA stroke ([name]) 05/18/2018   Chronic pain syndrome    Diabetes mellitus type 2 in nonobese [facility])    Sleep disturbance    NSTEMI (non-ST elevated myocardial infarction) ([name]) 09/25/2013   Coronary atherosclerosis of native coronary artery 09/25/2013   Essential hypertension 09/25/2013   Diabetes mellitus ([name]) 09/25/2013   Tobacco abuse 09/25/2013   Past Medical History:  Past Medical History:  Diagnosis Date   Anginal pain ([name]) 09/25/2013   Arthritis    CAD (coronary artery disease)    a. 09/25/13 s/p DES to mLCx and DES to pRCA.   Chronic left-sided back pain    has had ESI/Dr. Rosella   DM (diabetes mellitus) ([name])    HTN (hypertension)    Myocardial infarction ([name]) 09/2013   nstemi   Stroke ([name]) 05/11/2018   Tobacco abuse    Past Surgical History:  Past Surgical History:  Procedure Laterality Date   ABDOMINAL HYSTERECTOMY     ANKLE SURGERY Left    APPENDECTOMY     BREAST BIOPSY Right 06/09/2021   BREAST EXCISIONAL BIOPSY Right 02/11/2022   BREAST LUMPECTOMY WITH RADIOACTIVE SEED LOCALIZATION Right 02/11/2022   Procedure: RIGHT BREAST LUMPECTOMY WITH RADIOACTIVE SEED LOCALIZATION;  Surgeon: Vernetta Berg, MD;  Location: [facility] OR;   Service: General;  Laterality: Right;   CERVICAL SPINE SURGERY  2021   ACDF by Dr. Reyes Budge   CORONARY ANGIOPLASTY  09/25/2013   des mid circumflex  des rca   LEFT HEART CATHETERIZATION WITH CORONARY ANGIOGRAM N/A 09/25/2013   Procedure: LEFT HEART CATHETERIZATION WITH CORONARY ANGIOGRAM;  Surgeon: Lonni JONETTA Cash, MD;  Location: [facility] CATH LAB;  Service: Cardiovascular;  Laterality: N/A;   skin grafts right arm     HPI:  Rachel Perkins is a 73 y.o. female with medical history significant of hypertension, hyperlipidemia, CAD s/p stent placement, T2DM, prior stroke who presents to the emergency department at [facility] on 11/12/2023 due to confusion and right leg weakness.   07/05/205 Head CT   IMPRESSION:  1. Stable head CT. No acute intracranial abnormality.  2. Chronic right MCA territory infarct, with a few additional  smaller remote cortical infarcts involving the left frontal and  parietal lobes.  3. Underlying atrophy with chronic small vessel ischemic disease   Assessment / Plan / Recommendation Clinical Impression  While no caregiver was present to evaluate baseline cognitive linguistic abilities, pt presents with functional abilities during today's assessment. She is oriented x4, able to follow 1-2 step directions, demonstrates alternative and divided attention when performing basic tasks, ability to recall information (basic and historical) and her verbal expression appears intact. While her nurse reports unusual production of the word squirrel while ambulating dow the hall, pt didn't present with any instance of this during the evaluation. At this time, skilled ST don't  appear indicated at this time.    SLP Assessment  SLP Recommendation/Assessment: Patient does not need any further Speech Language Pathology Services SLP Visit Diagnosis: Cognitive communication deficit (R41.841)     Assistance Recommended at Discharge  None  Functional Status Assessment Patient has not  had a recent decline in their functional status  Frequency and Duration   N/A        SLP Evaluation Cognition  Overall Cognitive Status: Within Functional Limits for tasks assessed Arousal/Alertness: Awake/alert Orientation Level: Oriented to person;Oriented to place;Oriented to time;Oriented to situation Year: 2025 Month: July Day of Week: Correct Attention: Alternating;Divided Alternating Attention: Appears intact Divided Attention: Appears intact Memory: Appears intact Awareness: Appears intact Problem Solving: Appears intact Safety/Judgment: Appears intact       Comprehension  Auditory Comprehension Overall Auditory Comprehension: Appears within functional limits for tasks assessed Visual Recognition/Discrimination Discrimination: Within Function Limits Reading Comprehension Reading Status: Not tested    Expression Expression Primary Mode of Expression: Verbal Verbal Expression Overall Verbal Expression: Appears within functional limits for tasks assessed Written Expression Dominant Hand: Right Written Expression: Not tested   Oral / Motor  Oral Motor/Sensory Function Overall Oral Motor/Sensory Function: Within functional limits Motor Speech Overall Motor Speech: Appears within functional limits for tasks assessed           Rachel Perkins B. Rubbie, M.S., CCC-SLP, Tree surgeon Certified Brain Injury Specialist [facility]  [facility] Rehabilitation Services Office [phone] Ascom [phone] Fax [phone]

## 2023-11-14 NOTE — Discharge Summary (Signed)
 Physician Discharge Summary  Rachel Perkins [facility] FMW:[id number] DOB: [birth year] DOA: 11/12/2023  PCP: Maree Isles, MD  Admit date: 11/12/2023  Discharge date: 11/14/2023  Admitted From:Home  Disposition:  Home  Recommendations for Outpatient Follow-up:  Follow up with PCP in 1-2 weeks Follow-up outpatient with neurology with referral sent in 2-3 months Remain now on atorvastatin  40 mg daily and resume warfarin as prior.  Patient has been holding this medication for the last 1 week due to recent epidural and therefore was subtherapeutic as a result Continue other home medications as prior  Home Health: Yes with PT/OT  Equipment/Devices: None  Discharge Condition:Stable  CODE STATUS: Full  Diet recommendation: Heart Healthy/carb modified  Brief/Interim Summary: Rachel Perkins is a 73 y.o. female with medical history significant of hypertension, hyperlipidemia, CAD s/p stent placement, T2DM, prior stroke who presents to the emergency department due to confusion and right leg weakness.  Patient was unable to provide history possibly due to ongoing altered mental status, history was obtained from the EDP and ED medical record.  Per report last known well was 8 AM on 7/2, but she has presented with confusion, abnormal speech, weakness and has not been following commands since then.    CT head showed no acute intracranial abnormality and CTA was performed with no LVO.  MRI was avoided on account of metal rod in her neck and therefore repeat CT was performed with no findings of acute abnormalities.  2D echocardiogram with no findings of thrombus and preserved LVEF noted.  Case was discussed with neurology Dr. Michaela who recommended continuing warfarin as prior and it was noted that she was subtherapeutic as she was holding this medication for the last 1 week given recent epidural injection.  She appears to follow-up with [facility]mad[facility]  clinic regularly otherwise.  She has been seen by PT with  recommendations for home health services and has passed swallow evaluation and is now stable for discharge.  Discharge Diagnoses:  Principal Problem:   Aphasia Active Problems:   Essential hypertension   Mixed hyperlipidemia   Type 2 diabetes mellitus with hyperglycemia ([name])   Leukocytosis   Persistent atrial fibrillation ([name])   Depression  Principal discharge diagnosis: TIA versus stroke recrudescence.  No acute findings of ischemic CVA.  Discharge Instructions  Discharge Instructions     Diet - low sodium heart healthy   Complete by: As directed    Increase activity slowly   Complete by: As directed       Allergies as of 11/14/2023       Reactions   Codeine Itching   Pt says she can take    Other Anaphylaxis   NUTS  LOBSTER - causes itching    Penicillins Rash   DID THE REACTION INVOLVE: Swelling of the face/tongue/throat, SOB, or low BP? Unknown Sudden or severe rash/hives, skin peeling, or the inside of the mouth or nose? Unknown Did it require medical treatment? Unknown When did it last happen?      unk If all above answers are "NO", may proceed with cephalosporin use.   Sulfa Antibiotics Rash   Lisinopril Cough   Lorazepam  Other (See Comments)   Confusion Hyper Other Reaction(s): Confusion Other reaction(s): Confusion  Hyper Other reaction(s): Confusion Hyper    Hyper        Medication List     TAKE these medications    acetaminophen  325 MG tablet Commonly known as: TYLENOL  Take 1-2 tablets (325-650 mg total) by mouth every 4 (four) hours  as needed for mild pain.   atorvastatin  40 MG tablet Commonly known as: LIPITOR  Take 1 tablet (40 mg total) by mouth daily. Start taking on: November 15, 2023 What changed:  medication strength how much to take   azelastine 0.1 % nasal spray Commonly known as: ASTELIN Place 2 sprays into both nostrils 2 (two) times daily as needed for rhinitis or allergies. Use in each nostril as directed   calcium -vitamin  D 500-200 MG-UNIT tablet Commonly known as: OSCAL WITH D Take 1 tablet by mouth daily with breakfast.   cetirizine 10 MG chewable tablet Commonly known as: ZYRTEC Chew 10 mg by mouth daily.   cholecalciferol 25 MCG (1000 UNIT) tablet Commonly known as: VITAMIN D3 Take 1,000 Units by mouth daily.   cyclobenzaprine 10 MG tablet Commonly known as: FLEXERIL Take 10 mg by mouth 3 (three) times daily as needed for muscle spasms.   escitalopram  10 MG tablet Commonly known as: LEXAPRO  Take 10 mg by mouth daily.   metFORMIN  500 MG tablet Commonly known as: GLUCOPHAGE  Take 500 mg by mouth 2 (two) times daily.   metoprolol  tartrate 25 MG tablet Commonly known as: LOPRESSOR  Take 1 tablet (25 mg total) by mouth 2 (two) times daily.   nitroGLYCERIN  0.4 MG SL tablet Commonly known as: NITROSTAT  PLACE 1 TABLET UNDER THE TONGUE EVERY 5 MINUTES FOR 3 DOSES AS NEEDED CHEST PAIN   warfarin 5 MG tablet Commonly known as: COUMADIN  Take 1 tablet (5 mg total) by mouth daily at 6 PM.        Follow-up Information     Maree Isles, MD. Schedule an appointment as soon as possible for a visit in 1 week(s).   Specialty: Internal Medicine Contact information: [address]  [place] [address] [phone]         [facility]. Go in 2 month(s).   Specialty: Neurology Contact information: [address] [place]  [address] [phone]               Allergies  Allergen Reactions   Codeine Itching    Pt says she can take    Other Anaphylaxis    NUTS   LOBSTER - causes itching      Penicillins Rash    DID THE REACTION INVOLVE: Swelling of the face/tongue/throat, SOB, or low BP? Unknown Sudden or severe rash/hives, skin peeling, or the inside of the mouth or nose? Unknown Did it require medical treatment? Unknown When did it last happen?      unk If all above answers are "NO", may proceed with cephalosporin use.     Sulfa Antibiotics Rash   Lisinopril Cough   Lorazepam  Other (See Comments)    Confusion  Hyper  Other Reaction(s): Confusion  Other reaction(s): Confusion  Hyper  Other reaction(s): Confusion Hyper    Hyper    Consultations: Discussed case with neurology   Procedures/Studies: CT HEAD WO CONTRAST ( ) Result Date: 11/13/2023 CLINICAL DATA:  Follow-up examination for stroke. EXAM: CT HEAD WITHOUT CONTRAST TECHNIQUE: Contiguous axial images were obtained from the base of the skull through the vertex without intravenous contrast. RADIATION DOSE REDUCTION: This exam was performed according to the departmental dose-optimization program which includes automated exposure control, adjustment of the mA and/or kV according to patient size and/or use of iterative reconstruction technique. COMPARISON:  Prior studies from 11/12/2023. FINDINGS: Brain: Age-related cerebral atrophy with chronic small vessel ischemic disease. Chronic right MCA territory infarct. Few additional small remote  cortical infarcts involving the left frontal and parietal lobes. Appearance is stable. No acute intracranial hemorrhage. No visible acute or evolving large vessel territory infarct. No mass lesion or midline shift. No hydrocephalus or extra-axial fluid collection. Vascular: No abnormal hyperdense vessel. Scattered vascular calcifications noted within the carotid siphons. Skull: No new finding. Sinuses/Orbits: Globes and orbital soft tissues within normal limits. Paranasal sinuses and mastoid air cells remain clear. Other: None. IMPRESSION: 1. Stable head CT. No acute intracranial abnormality. 2. Chronic right MCA territory infarct, with a few additional smaller remote cortical infarcts involving the left frontal and parietal lobes. 3. Underlying atrophy with chronic small vessel ischemic disease. Electronically Signed   By: Morene Hoard M.D.   On: 11/13/2023 21:53   ECHOCARDIOGRAM COMPLETE Result Date: 11/13/2023     ECHOCARDIOGRAM REPORT   Patient Name:   Rachel Perkins Date of Exam: 11/13/2023 Medical Rec #:  [id number]      Height:       63.0 in Accession #:    [id number]     Weight:       134.4 lb Date of Birth:  23-Mar-1951      BSA:          1.633 m Patient Age:    73 years       BP:           149/72 mmHg Patient Gender: F              HR:           70 bpm. Exam Location:  [facility] Procedure: 2D Echo, Cardiac Doppler and Color Doppler (Both Spectral and Color            Flow Doppler were utilized during procedure). Indications:    Stroke l63.9  History:        Patient has prior history of Echocardiogram examinations, most                 recent 05/26/2022. CAD and Previous Myocardial Infarction,                 Stroke; Risk Factors:Hypertension, Diabetes, Dyslipidemia and                 Current Smoker.  Sonographer:    Aida Pizza RCS Referring Phys: [id number] OLADAPO ADEFESO IMPRESSIONS  1. Left ventricular ejection fraction, by estimation, is 70 to 75%. The left ventricle has hyperdynamic function. The left ventricle has no regional wall motion abnormalities. Left ventricular diastolic parameters are indeterminate.  2. Right ventricular systolic function is normal. The right ventricular size is normal.  3. Left atrial size was upper normal.  4. The mitral valve is degenerative. Mild mitral valve regurgitation.  5. The aortic valve is tricuspid. There is mild calcification of the aortic valve. Aortic valve regurgitation is not visualized. Aortic valve sclerosis is present, with no evidence of aortic valve stenosis.  6. The inferior vena cava is normal in size with greater than 50% respiratory variability, suggesting right atrial pressure of 3 mmHg. Comparison(s): Prior images reviewed side by side. LVEF 70-75%, indeterminate diastolic function. FINDINGS  Left Ventricle: Left ventricular ejection fraction, by estimation, is 70 to 75%. The left ventricle has hyperdynamic function. The left ventricle has no regional wall  motion abnormalities. The left ventricular internal cavity size was normal in size. There is borderline left ventricular hypertrophy. Left ventricular diastolic parameters are indeterminate. Right Ventricle: The right ventricular size is normal. No increase in right ventricular  wall thickness. Right ventricular systolic function is normal. Left Atrium: Left atrial size was upper normal. Right Atrium: Right atrial size was normal in size. Pericardium: There is no evidence of pericardial effusion. Mitral Valve: The mitral valve is degenerative in appearance. Mild mitral valve regurgitation. Tricuspid Valve: The tricuspid valve is grossly normal. Tricuspid valve regurgitation is trivial. Aortic Valve: The aortic valve is tricuspid. There is mild calcification of the aortic valve. There is mild aortic valve annular calcification. Aortic valve regurgitation is not visualized. Aortic valve sclerosis is present, with no evidence of aortic valve stenosis. Pulmonic Valve: The pulmonic valve was grossly normal. Pulmonic valve regurgitation is trivial. Aorta: The aortic root is normal in size and structure. Venous: The inferior vena cava is normal in size with greater than 50% respiratory variability, suggesting right atrial pressure of 3 mmHg. IAS/Shunts: No atrial level shunt detected by color flow Doppler. Additional Comments: 3D was performed not requiring image post processing on an independent workstation and was indeterminate.  LEFT VENTRICLE PLAX 2D LVIDd:         4.10 cm   Diastology LVIDs:         2.20 cm   LV e' medial:    5.87 cm/s LV PW:         1.00 cm   LV E/e' medial:  11.6 LV IVS:        1.00 cm   LV e' lateral:   7.07 cm/s LVOT diam:     1.60 cm   LV E/e' lateral: 9.7 LV SV:         53 LV SV Index:   33 LVOT Area:     2.01 cm  RIGHT VENTRICLE RV S prime:     10.40 cm/s TAPSE (M-mode): 1.7 cm LEFT ATRIUM             Index        RIGHT ATRIUM          Index LA diam:        3.00 cm 1.84 cm/m   RA Area:      9.74 cm LA Vol (A2C):   47.9 ml 29.33 ml/m  RA Volume:   16.80 ml 10.29 ml/m LA Vol (A4C):   47.8 ml 29.27 ml/m LA Biplane Vol: 48.4 ml 29.64 ml/m  AORTIC VALVE LVOT Vmax:   111.00 cm/s LVOT Vmean:  78.200 cm/s LVOT VTI:    0.266 m  AORTA Ao Root diam: 3.00 cm MITRAL VALVE MV Area (PHT): 2.48 cm     SHUNTS MV Decel Time: 306 msec     Systemic VTI:  0.27 m MV E velocity: 68.30 cm/s   Systemic Diam: 1.60 cm MV A velocity: 106.00 cm/s MV E/A ratio:  0.64 Jayson Sierras MD Electronically signed by Jayson Sierras MD Signature Date/Time: 11/13/2023/2:53:38 PM    Final    DG Chest Portable 1 View Result Date: 11/13/2023 CLINICAL DATA:  Suspicion for pneumonia. Increased weakness and confusion starting this morning. EXAM: PORTABLE CHEST 1 VIEW COMPARISON:  05/25/2022 FINDINGS: Patient rotation limits examination. As visualized, the heart size and pulmonary vascularity appear normal. The lungs appear clear and expanded. No pleural effusion or pneumothorax. Postoperative changes in the cervical spine. IMPRESSION: Patient rotation limits examination. No evidence of active pulmonary disease. Electronically Signed   By: Elsie Gravely M.D.   On: 11/13/2023 00:09   CT ANGIO HEAD NECK W WO CM W PERF (CODE STROKE) Result Date: 11/12/2023 CLINICAL DATA:  Initial evaluation for  acute stroke/TIA. EXAM: CT ANGIOGRAPHY HEAD AND NECK CT PERFUSION BRAIN TECHNIQUE: Multidetector CT imaging of the head and neck was performed using the standard protocol during bolus administration of intravenous contrast. Multiplanar CT image reconstructions and MIPs were obtained to evaluate the vascular anatomy. Carotid stenosis measurements (when applicable) are obtained utilizing NASCET criteria, using the distal internal carotid diameter as the denominator. Multiphase CT imaging of the brain was performed following IV bolus contrast injection. Subsequent parametric perfusion maps were calculated using RAPID software. RADIATION DOSE  REDUCTION: This exam was performed according to the departmental dose-optimization program which includes automated exposure control, adjustment of the mA and/or kV according to patient size and/or use of iterative reconstruction technique. CONTRAST:  OMNIPAQUE  IOHEXOL  350 MG/ML SOLN COMPARISON:  CT from earlier the same day. FINDINGS: CTA NECK FINDINGS Aortic arch: Aortic arch within normal limits for caliber with standard branch pattern. Aortic atherosclerosis. No significant stenosis about the origin the great vessels. Right carotid system: Right common and internal carotid arteries are patent without dissection. Atheromatous change about the right carotid bulb without hemodynamically significant stenosis. Left carotid system: Left common and internal carotid arteries are patent without dissection. Atheromatous change about the left carotid bulb without hemodynamically significant stenosis. Vertebral arteries: Right vertebral artery dominant and is widely patent without stenosis or dissection. Left vertebral artery arises directly from the aortic arch and is occluded at its origin. Distal reconstitution at the distal left V2 segment with attenuated flow distally to the skull base. Skeleton: No worrisome osseous lesions. Prior ACDF at C5-C7 with solid arthrodesis. Other neck: No other acute finding. Upper chest: No other acute finding. Review of the MIP images confirms the above findings CTA HEAD FINDINGS Anterior circulation: Atheromatous change about the carotid siphons without hemodynamically significant stenosis. A1 segments patent bilaterally. Normal anterior communicating artery complex. Anterior cerebral arteries patent without stenosis. No M1 stenosis or occlusion. Distal MCA branches perfused and symmetric. Posterior circulation: Dominant right V4 segment widely patent. Left V4 segment irregular but patent to the vertebrobasilar junction without significant stenosis. Both PICA patent. Basilar patent  without stenosis. Superior cerebellar and posterior cerebral arteries patent bilaterally. Venous sinuses: Patent allowing for timing the contrast bolus. Anatomic variants: As above.  No aneurysm. Review of the MIP images confirms the above findings CT Brain Perfusion Findings: ASPECTS: 10 CBF (<30%) Volume: 0mL Perfusion (Tmax>6.0s) volume: 7mL Mismatch Volume: 7mL Infarction Location:Negative CT perfusion for acute core infarct. Apparent 7 mL area of delayed perfusion largely corresponds with the chronic right MCA territory infarct. No convincing perfusion abnormality. IMPRESSION: 1. Negative CTA for large vessel occlusion or other emergent finding. 2. Negative CT perfusion with no evidence for acute ischemia. 3. Occlusion of the left vertebral artery at its origin. Distal reconstitution at the left V2 segment with attenuated flow distally to the skull base. Dominant right vertebral artery widely patent. 4. Atheromatous change about the carotid bifurcations and carotid siphons without hemodynamically significant stenosis. 5.  Aortic Atherosclerosis (ICD10-I70.0). Electronically Signed   By: Morene Hoard M.D.   On: 11/12/2023 23:23   CT HEAD CODE STROKE WO CONTRAST Result Date: 11/12/2023 CLINICAL DATA:  Code stroke. Initial evaluation for acute stroke. No other relevant history is provided. EXAM: CT HEAD WITHOUT CONTRAST TECHNIQUE: Contiguous axial images were obtained from the base of the skull through the vertex without intravenous contrast. RADIATION DOSE REDUCTION: This exam was performed according to the departmental dose-optimization program which includes automated exposure control, adjustment of the mA and/or kV according  to patient size and/or use of iterative reconstruction technique. COMPARISON:  Prior study from 05/26/2022 FINDINGS: Brain: Age-related cerebral atrophy with chronic microvascular ischemic disease. Multiple remote infarcts noted involving the right greater than left cerebral  hemispheres. No acute intracranial hemorrhage. No acute large vessel territory infarct. No mass lesion or midline shift. No hydrocephalus or extra-axial fluid collection. Vascular: No abnormal hyperdense vessel. Scattered vascular calcifications noted within the carotid siphons. Skull: Scalp soft tissues within normal limits.  Calvarium intact. Sinuses/Orbits: Globes and orbital soft tissues within normal limits. Paranasal sinuses are clear. No mastoid effusion. Other: None. ASPECTS [facility] Stroke Program Early CT Score) - Ganglionic level infarction (caudate, lentiform nuclei, internal capsule, insula, M1-M3 cortex): 7 - Supraganglionic infarction (M4-M6 cortex): 3 Total score (0-10 with 10 being normal): 10 IMPRESSION: 1. No acute intracranial abnormality. 2. Aspects is 10. 3. Multifocal remote infarcts involving the bilateral cerebral hemispheres with underlying chronic microvascular ischemic disease. Results were called by telephone at the time of interpretation on 11/12/2023 at 8:43 pm to provider ELSIE BODY , who verbally acknowledged these results. Electronically Signed   By: Morene Hoard M.D.   On: 11/12/2023 20:43     Discharge Exam: Vitals:   11/13/23 2258 11/14/23 0334  BP: (!) 166/97 (!) 159/86  Pulse: 80 73  Resp: 18 20  Temp:  97.7 F (36.5 C)  SpO2: 96% 95%   Vitals:   11/13/23 1936 11/13/23 1938 11/13/23 2258 11/14/23 0334  BP: (!) 161/79 (!) 161/79 (!) 166/97 (!) 159/86  Pulse: 72 68 80 73  Resp: 20 20 18 20   Temp: 98.8 F (37.1 C) 98.8 F (37.1 C)  97.7 F (36.5 C)  TempSrc: Oral Oral  Oral  SpO2: 95% 94% 96% 95%    General: Pt is alert, awake, not in acute distress Cardiovascular: RRR, S1/S2 +, no rubs, no gallops Respiratory: CTA bilaterally, no wheezing, no rhonchi Abdominal: Soft, NT, ND, bowel sounds + Extremities: no edema, no cyanosis    The results of significant diagnostics from this hospitalization (including imaging, microbiology, ancillary  and laboratory) are listed below for reference.     Microbiology: Recent Results (from the past 240 hours)  Culture, blood (routine x 2)     Status: None (Preliminary result)   Collection Time: 11/12/23 10:32 PM   Specimen: Right Antecubital; Blood  Result Value Ref Range Status   Specimen Description RIGHT ANTECUBITAL  Final   Special Requests   Final    BOTTLES DRAWN AEROBIC AND ANAEROBIC Blood Culture adequate volume   Culture   Final    NO GROWTH 2 DAYS Performed at [facility], [address], [place], [place] [address]    Report Status PENDING  Incomplete  Culture, blood (routine x 2)     Status: None (Preliminary result)   Collection Time: 11/12/23 11:54 PM   Specimen: Left Antecubital; Blood  Result Value Ref Range Status   Specimen Description LEFT ANTECUBITAL  Final   Special Requests   Final    BOTTLES DRAWN AEROBIC AND ANAEROBIC Blood Culture adequate volume   Culture   Final    NO GROWTH 2 DAYS Performed at [facility], [address], [place], [place] [address]    Report Status PENDING  Incomplete     Labs: BNP (last 3 results) No results for input(s): BNP in the last 8760 hours. Basic Metabolic Panel: Recent Labs  Lab 11/12/23 2019 11/13/23 0347 11/14/23 0359  NA 134* 133* 136  K 4.4 3.7 3.6  CL 102 104 107  CO2 23 22 21*  GLUCOSE 141* 118* 110*  BUN 12 11 12   CREATININE 1.09* 0.97 0.96  CALCIUM  9.0 8.6* 8.6*  MG  --  1.6* 2.1  PHOS  --  3.6  --    Liver Function Tests: Recent Labs  Lab 11/12/23 2019 11/13/23 0347  AST 15 12*  ALT 15 13  ALKPHOS 69 64  BILITOT 0.9 0.7  PROT 6.8 6.2*  ALBUMIN 4.1 3.7   No results for input(s): LIPASE, AMYLASE in the last 168 hours. No results for input(s): AMMONIA in the last 168 hours. CBC: Recent Labs  Lab 11/12/23 2019 11/13/23 0347 11/14/23 0359  WBC 13.7* 12.3* 10.2  NEUTROABS 10.9*  --   --   HGB 14.0 13.5 12.9  HCT 40.9 39.1 38.3  MCV 95.1 94.2 93.6  PLT 272 243 235   Cardiac  Enzymes: No results for input(s): CKTOTAL, CKMB, CKMBINDEX, TROPONINI in the last 168 hours. BNP: Invalid input(s): POCBNP CBG: Recent Labs  Lab 11/13/23 1551 11/13/23 2037 11/14/23 0055 11/14/23 0331 11/14/23 0708  GLUCAP 145* 122* 118* 122* 110*   D-Dimer No results for input(s): DDIMER in the last 72 hours. Hgb A1c Recent Labs    11/13/23 0347  HGBA1C 6.8*   Lipid Profile Recent Labs    11/13/23 0347  CHOL 90  HDL 45  LDLCALC 30  TRIG 74  CHOLHDL 2.0   Thyroid  function studies No results for input(s): TSH, T4TOTAL, T3FREE, THYROIDAB in the last 72 hours.  Invalid input(s): FREET3 Anemia work up No results for input(s): VITAMINB12, FOLATE, FERRITIN, TIBC, IRON, RETICCTPCT in the last 72 hours. Urinalysis    Component Value Date/Time   COLORURINE YELLOW 11/12/2023 2129   APPEARANCEUR CLEAR 11/12/2023 2129   LABSPEC 1.011 11/12/2023 2129   PHURINE 5.0 11/12/2023 2129   GLUCOSEU NEGATIVE 11/12/2023 2129   HGBUR SMALL (A) 11/12/2023 2129   BILIRUBINUR NEGATIVE 11/12/2023 2129   KETONESUR NEGATIVE 11/12/2023 2129   PROTEINUR NEGATIVE 11/12/2023 2129   NITRITE NEGATIVE 11/12/2023 2129   LEUKOCYTESUR NEGATIVE 11/12/2023 2129   Sepsis Labs Recent Labs  Lab 11/12/23 2019 11/13/23 0347 11/14/23 0359  WBC 13.7* 12.3* 10.2   Microbiology Recent Results (from the past 240 hours)  Culture, blood (routine x 2)     Status: None (Preliminary result)   Collection Time: 11/12/23 10:32 PM   Specimen: Right Antecubital; Blood  Result Value Ref Range Status   Specimen Description RIGHT ANTECUBITAL  Final   Special Requests   Final    BOTTLES DRAWN AEROBIC AND ANAEROBIC Blood Culture adequate volume   Culture   Final    NO GROWTH 2 DAYS Performed at [facility], [address], [place], [place] [address]    Report Status PENDING  Incomplete  Culture, blood (routine x 2)     Status: None (Preliminary result)   Collection Time:  11/12/23 11:54 PM   Specimen: Left Antecubital; Blood  Result Value Ref Range Status   Specimen Description LEFT ANTECUBITAL  Final   Special Requests   Final    BOTTLES DRAWN AEROBIC AND ANAEROBIC Blood Culture adequate volume   Culture   Final    NO GROWTH 2 DAYS Performed at [facility], [address] [place], [place] [address]    Report Status PENDING  Incomplete     Time coordinating discharge: 35 minutes  SIGNED:   Adron JONETTA Fairly, DO [facility] 11/14/2023, 10:50 AM  If 7PM-7AM, please contact  night-coverage [email or web address]

## 2023-11-14 NOTE — Discharge Instructions (Addendum)
 [facility] [address] [place], [place]  [address] [phone] This agency will contact you to schedule your home physical therapy.

## 2023-11-14 NOTE — Progress Notes (Signed)
 Pt A&O x4. Pt assisted up OOB, ambulated in hallway using FWW and standby assist, > 525ft. Pt's only complaint is that her left leg feels heavy and she has a slight drag of leg. Pt to restroom and voided without difficulty. Pt currently up in chair with chair alarm on for safety. Pt given swallow test and passed without difficulty or any coughing/choking. MD Maree at bedside, states will place diet order. Pt agreeable.

## 2023-11-14 NOTE — TOC Transition Note (Signed)
 Transition of Care [facility]) - Discharge Note  Patient Details  Name: Rachel Perkins MRN: [id number] Date of Birth: March 24, 1951  [facility] CM/SW Contact:  Nena LITTIE Coffee, RN Phone Number: 11/14/2023, 11:19 AM   Clinical Narrative:    Pt will dc home today with [facility] HHPT.  AVS updated.   Final next level of care: Home w Home Health Services Barriers to Discharge: Barriers Resolved  Patient Goals and CMS Choice     Discharge Placement   Discharge Plan and Services Additional resources added to the After Visit Summary for    [facility] Arranged: PT [facility] Agency: [facility] Date [facility] Agency Contacted: 11/13/23   Representative spoke with at [facility] Agency: Darice  Social Drivers of Health (SDOH) Interventions SDOH Screenings   Food Insecurity: No Food Insecurity (11/13/2023)  Housing: Low Risk  (11/13/2023)  Transportation Needs: No Transportation Needs (11/13/2023)  Utilities: Not At Risk (11/13/2023)  Depression (PHQ2-9): Low Risk  (06/06/2018)  Financial Resource Strain: Low Risk  (09/30/2023)   Received from [facility]  Social Connections: Socially Integrated (11/13/2023)  Tobacco Use: High Risk (11/13/2023)   Readmission Risk Interventions    11/13/2023    5:18 PM  Readmission Risk Prevention Plan  Post Dischage Appt Complete  Medication Screening Complete  Transportation Screening Complete

## 2023-11-14 NOTE — Progress Notes (Signed)
Pt discharged via WC to POV.

## 2023-11-17 DIAGNOSIS — F32A Depression, unspecified: Secondary | ICD-10-CM | POA: Diagnosis not present

## 2023-11-17 DIAGNOSIS — I10 Essential (primary) hypertension: Secondary | ICD-10-CM | POA: Diagnosis not present

## 2023-11-17 DIAGNOSIS — Z7901 Long term (current) use of anticoagulants: Secondary | ICD-10-CM | POA: Diagnosis not present

## 2023-11-17 DIAGNOSIS — I2510 Atherosclerotic heart disease of native coronary artery without angina pectoris: Secondary | ICD-10-CM | POA: Diagnosis not present

## 2023-11-17 DIAGNOSIS — I69351 Hemiplegia and hemiparesis following cerebral infarction affecting right dominant side: Secondary | ICD-10-CM | POA: Diagnosis not present

## 2023-11-17 DIAGNOSIS — E782 Mixed hyperlipidemia: Secondary | ICD-10-CM | POA: Diagnosis not present

## 2023-11-17 DIAGNOSIS — Z7984 Long term (current) use of oral hypoglycemic drugs: Secondary | ICD-10-CM | POA: Diagnosis not present

## 2023-11-17 DIAGNOSIS — Z955 Presence of coronary angioplasty implant and graft: Secondary | ICD-10-CM | POA: Diagnosis not present

## 2023-11-17 DIAGNOSIS — I4821 Permanent atrial fibrillation: Secondary | ICD-10-CM | POA: Diagnosis not present

## 2023-11-17 DIAGNOSIS — E119 Type 2 diabetes mellitus without complications: Secondary | ICD-10-CM | POA: Diagnosis not present

## 2023-11-17 LAB — CULTURE, BLOOD (ROUTINE X 2)
Culture: NO GROWTH
Culture: NO GROWTH
Special Requests: ADEQUATE
Special Requests: ADEQUATE

## 2023-11-18 DIAGNOSIS — G459 Transient cerebral ischemic attack, unspecified: Secondary | ICD-10-CM | POA: Diagnosis not present

## 2023-11-18 DIAGNOSIS — Z299 Encounter for prophylactic measures, unspecified: Secondary | ICD-10-CM | POA: Diagnosis not present

## 2023-11-18 DIAGNOSIS — E1169 Type 2 diabetes mellitus with other specified complication: Secondary | ICD-10-CM | POA: Diagnosis not present

## 2023-11-18 DIAGNOSIS — I10 Essential (primary) hypertension: Secondary | ICD-10-CM | POA: Diagnosis not present

## 2023-11-23 DIAGNOSIS — F32A Depression, unspecified: Secondary | ICD-10-CM | POA: Diagnosis not present

## 2023-11-23 DIAGNOSIS — I69351 Hemiplegia and hemiparesis following cerebral infarction affecting right dominant side: Secondary | ICD-10-CM | POA: Diagnosis not present

## 2023-11-23 DIAGNOSIS — I4821 Permanent atrial fibrillation: Secondary | ICD-10-CM | POA: Diagnosis not present

## 2023-11-23 DIAGNOSIS — E119 Type 2 diabetes mellitus without complications: Secondary | ICD-10-CM | POA: Diagnosis not present

## 2023-11-23 DIAGNOSIS — I2510 Atherosclerotic heart disease of native coronary artery without angina pectoris: Secondary | ICD-10-CM | POA: Diagnosis not present

## 2023-11-23 DIAGNOSIS — I10 Essential (primary) hypertension: Secondary | ICD-10-CM | POA: Diagnosis not present

## 2023-11-25 DIAGNOSIS — I4821 Permanent atrial fibrillation: Secondary | ICD-10-CM | POA: Diagnosis not present

## 2023-11-25 DIAGNOSIS — I10 Essential (primary) hypertension: Secondary | ICD-10-CM | POA: Diagnosis not present

## 2023-11-25 DIAGNOSIS — F32A Depression, unspecified: Secondary | ICD-10-CM | POA: Diagnosis not present

## 2023-11-25 DIAGNOSIS — E119 Type 2 diabetes mellitus without complications: Secondary | ICD-10-CM | POA: Diagnosis not present

## 2023-11-25 DIAGNOSIS — I2510 Atherosclerotic heart disease of native coronary artery without angina pectoris: Secondary | ICD-10-CM | POA: Diagnosis not present

## 2023-11-25 DIAGNOSIS — I69351 Hemiplegia and hemiparesis following cerebral infarction affecting right dominant side: Secondary | ICD-10-CM | POA: Diagnosis not present

## 2023-11-29 DIAGNOSIS — G8194 Hemiplegia, unspecified affecting left nondominant side: Secondary | ICD-10-CM | POA: Diagnosis not present

## 2023-11-29 DIAGNOSIS — Z299 Encounter for prophylactic measures, unspecified: Secondary | ICD-10-CM | POA: Diagnosis not present

## 2023-11-29 DIAGNOSIS — I10 Essential (primary) hypertension: Secondary | ICD-10-CM | POA: Diagnosis not present

## 2023-11-29 DIAGNOSIS — E1165 Type 2 diabetes mellitus with hyperglycemia: Secondary | ICD-10-CM | POA: Diagnosis not present

## 2023-12-06 DIAGNOSIS — I4821 Permanent atrial fibrillation: Secondary | ICD-10-CM | POA: Diagnosis not present

## 2023-12-06 DIAGNOSIS — I2510 Atherosclerotic heart disease of native coronary artery without angina pectoris: Secondary | ICD-10-CM | POA: Diagnosis not present

## 2023-12-06 DIAGNOSIS — I69351 Hemiplegia and hemiparesis following cerebral infarction affecting right dominant side: Secondary | ICD-10-CM | POA: Diagnosis not present

## 2023-12-06 DIAGNOSIS — F32A Depression, unspecified: Secondary | ICD-10-CM | POA: Diagnosis not present

## 2023-12-06 DIAGNOSIS — E119 Type 2 diabetes mellitus without complications: Secondary | ICD-10-CM | POA: Diagnosis not present

## 2023-12-06 DIAGNOSIS — I10 Essential (primary) hypertension: Secondary | ICD-10-CM | POA: Diagnosis not present

## 2023-12-09 DIAGNOSIS — F32A Depression, unspecified: Secondary | ICD-10-CM | POA: Diagnosis not present

## 2023-12-09 DIAGNOSIS — I4821 Permanent atrial fibrillation: Secondary | ICD-10-CM | POA: Diagnosis not present

## 2023-12-09 DIAGNOSIS — E119 Type 2 diabetes mellitus without complications: Secondary | ICD-10-CM | POA: Diagnosis not present

## 2023-12-09 DIAGNOSIS — I10 Essential (primary) hypertension: Secondary | ICD-10-CM | POA: Diagnosis not present

## 2023-12-09 DIAGNOSIS — I2510 Atherosclerotic heart disease of native coronary artery without angina pectoris: Secondary | ICD-10-CM | POA: Diagnosis not present

## 2023-12-09 DIAGNOSIS — I69351 Hemiplegia and hemiparesis following cerebral infarction affecting right dominant side: Secondary | ICD-10-CM | POA: Diagnosis not present

## 2023-12-13 DIAGNOSIS — F32A Depression, unspecified: Secondary | ICD-10-CM | POA: Diagnosis not present

## 2023-12-13 DIAGNOSIS — I2510 Atherosclerotic heart disease of native coronary artery without angina pectoris: Secondary | ICD-10-CM | POA: Diagnosis not present

## 2023-12-13 DIAGNOSIS — E119 Type 2 diabetes mellitus without complications: Secondary | ICD-10-CM | POA: Diagnosis not present

## 2023-12-13 DIAGNOSIS — I10 Essential (primary) hypertension: Secondary | ICD-10-CM | POA: Diagnosis not present

## 2023-12-13 DIAGNOSIS — I4821 Permanent atrial fibrillation: Secondary | ICD-10-CM | POA: Diagnosis not present

## 2023-12-13 DIAGNOSIS — I69351 Hemiplegia and hemiparesis following cerebral infarction affecting right dominant side: Secondary | ICD-10-CM | POA: Diagnosis not present

## 2023-12-20 DIAGNOSIS — M4726 Other spondylosis with radiculopathy, lumbar region: Secondary | ICD-10-CM | POA: Diagnosis not present

## 2023-12-20 DIAGNOSIS — M47816 Spondylosis without myelopathy or radiculopathy, lumbar region: Secondary | ICD-10-CM | POA: Diagnosis not present

## 2023-12-20 DIAGNOSIS — M51362 Other intervertebral disc degeneration, lumbar region with discogenic back pain and lower extremity pain: Secondary | ICD-10-CM | POA: Diagnosis not present

## 2023-12-20 DIAGNOSIS — M48061 Spinal stenosis, lumbar region without neurogenic claudication: Secondary | ICD-10-CM | POA: Diagnosis not present

## 2023-12-29 DIAGNOSIS — R350 Frequency of micturition: Secondary | ICD-10-CM | POA: Diagnosis not present

## 2023-12-29 DIAGNOSIS — I10 Essential (primary) hypertension: Secondary | ICD-10-CM | POA: Diagnosis not present

## 2023-12-29 DIAGNOSIS — G8194 Hemiplegia, unspecified affecting left nondominant side: Secondary | ICD-10-CM | POA: Diagnosis not present

## 2023-12-29 DIAGNOSIS — N390 Urinary tract infection, site not specified: Secondary | ICD-10-CM | POA: Diagnosis not present

## 2023-12-29 DIAGNOSIS — Z299 Encounter for prophylactic measures, unspecified: Secondary | ICD-10-CM | POA: Diagnosis not present

## 2023-12-31 DIAGNOSIS — I10 Essential (primary) hypertension: Secondary | ICD-10-CM | POA: Diagnosis not present

## 2023-12-31 DIAGNOSIS — I69351 Hemiplegia and hemiparesis following cerebral infarction affecting right dominant side: Secondary | ICD-10-CM | POA: Diagnosis not present

## 2023-12-31 DIAGNOSIS — I2510 Atherosclerotic heart disease of native coronary artery without angina pectoris: Secondary | ICD-10-CM | POA: Diagnosis not present

## 2023-12-31 DIAGNOSIS — E119 Type 2 diabetes mellitus without complications: Secondary | ICD-10-CM | POA: Diagnosis not present

## 2024-01-20 DIAGNOSIS — I10 Essential (primary) hypertension: Secondary | ICD-10-CM | POA: Diagnosis not present

## 2024-01-20 DIAGNOSIS — I69351 Hemiplegia and hemiparesis following cerebral infarction affecting right dominant side: Secondary | ICD-10-CM | POA: Diagnosis not present

## 2024-01-20 DIAGNOSIS — I2510 Atherosclerotic heart disease of native coronary artery without angina pectoris: Secondary | ICD-10-CM | POA: Diagnosis not present

## 2024-01-20 DIAGNOSIS — E119 Type 2 diabetes mellitus without complications: Secondary | ICD-10-CM | POA: Diagnosis not present

## 2024-01-26 DIAGNOSIS — I69952 Hemiplegia and hemiparesis following unspecified cerebrovascular disease affecting left dominant side: Secondary | ICD-10-CM | POA: Diagnosis not present

## 2024-01-26 DIAGNOSIS — Z23 Encounter for immunization: Secondary | ICD-10-CM | POA: Diagnosis not present

## 2024-01-26 DIAGNOSIS — I10 Essential (primary) hypertension: Secondary | ICD-10-CM | POA: Diagnosis not present

## 2024-01-26 DIAGNOSIS — N390 Urinary tract infection, site not specified: Secondary | ICD-10-CM | POA: Diagnosis not present

## 2024-01-26 DIAGNOSIS — Z299 Encounter for prophylactic measures, unspecified: Secondary | ICD-10-CM | POA: Diagnosis not present

## 2024-02-07 ENCOUNTER — Institutional Professional Consult (permissible substitution): Admitting: Diagnostic Neuroimaging

## 2024-02-16 DIAGNOSIS — N390 Urinary tract infection, site not specified: Secondary | ICD-10-CM | POA: Diagnosis not present

## 2024-02-16 DIAGNOSIS — R52 Pain, unspecified: Secondary | ICD-10-CM | POA: Diagnosis not present

## 2024-02-16 DIAGNOSIS — R350 Frequency of micturition: Secondary | ICD-10-CM | POA: Diagnosis not present

## 2024-02-16 DIAGNOSIS — E1169 Type 2 diabetes mellitus with other specified complication: Secondary | ICD-10-CM | POA: Diagnosis not present

## 2024-02-16 DIAGNOSIS — I10 Essential (primary) hypertension: Secondary | ICD-10-CM | POA: Diagnosis not present

## 2024-02-16 DIAGNOSIS — G8194 Hemiplegia, unspecified affecting left nondominant side: Secondary | ICD-10-CM | POA: Diagnosis not present

## 2024-02-16 DIAGNOSIS — Z299 Encounter for prophylactic measures, unspecified: Secondary | ICD-10-CM | POA: Diagnosis not present

## 2024-02-21 DIAGNOSIS — Z6824 Body mass index (BMI) 24.0-24.9, adult: Secondary | ICD-10-CM | POA: Diagnosis not present

## 2024-02-21 DIAGNOSIS — E119 Type 2 diabetes mellitus without complications: Secondary | ICD-10-CM | POA: Diagnosis not present

## 2024-02-21 DIAGNOSIS — I10 Essential (primary) hypertension: Secondary | ICD-10-CM | POA: Diagnosis not present

## 2024-02-21 DIAGNOSIS — Z299 Encounter for prophylactic measures, unspecified: Secondary | ICD-10-CM | POA: Diagnosis not present

## 2024-02-21 DIAGNOSIS — G8194 Hemiplegia, unspecified affecting left nondominant side: Secondary | ICD-10-CM | POA: Diagnosis not present

## 2024-03-21 DIAGNOSIS — Z299 Encounter for prophylactic measures, unspecified: Secondary | ICD-10-CM | POA: Diagnosis not present

## 2024-03-21 DIAGNOSIS — I10 Essential (primary) hypertension: Secondary | ICD-10-CM | POA: Diagnosis not present

## 2024-03-21 DIAGNOSIS — M7062 Trochanteric bursitis, left hip: Secondary | ICD-10-CM | POA: Diagnosis not present

## 2024-03-21 DIAGNOSIS — G8194 Hemiplegia, unspecified affecting left nondominant side: Secondary | ICD-10-CM | POA: Diagnosis not present

## 2024-03-21 DIAGNOSIS — E1169 Type 2 diabetes mellitus with other specified complication: Secondary | ICD-10-CM | POA: Diagnosis not present

## 2024-03-21 DIAGNOSIS — R52 Pain, unspecified: Secondary | ICD-10-CM | POA: Diagnosis not present

## 2024-03-28 DIAGNOSIS — E7800 Pure hypercholesterolemia, unspecified: Secondary | ICD-10-CM | POA: Diagnosis not present

## 2024-03-28 DIAGNOSIS — Z1339 Encounter for screening examination for other mental health and behavioral disorders: Secondary | ICD-10-CM | POA: Diagnosis not present

## 2024-03-28 DIAGNOSIS — R5383 Other fatigue: Secondary | ICD-10-CM | POA: Diagnosis not present

## 2024-03-28 DIAGNOSIS — F419 Anxiety disorder, unspecified: Secondary | ICD-10-CM | POA: Diagnosis not present

## 2024-03-28 DIAGNOSIS — R52 Pain, unspecified: Secondary | ICD-10-CM | POA: Diagnosis not present

## 2024-03-28 DIAGNOSIS — Z1331 Encounter for screening for depression: Secondary | ICD-10-CM | POA: Diagnosis not present

## 2024-03-28 DIAGNOSIS — Z299 Encounter for prophylactic measures, unspecified: Secondary | ICD-10-CM | POA: Diagnosis not present

## 2024-03-28 DIAGNOSIS — I10 Essential (primary) hypertension: Secondary | ICD-10-CM | POA: Diagnosis not present

## 2024-03-28 DIAGNOSIS — Z0000 Encounter for general adult medical examination without abnormal findings: Secondary | ICD-10-CM | POA: Diagnosis not present

## 2024-03-28 DIAGNOSIS — Z7189 Other specified counseling: Secondary | ICD-10-CM | POA: Diagnosis not present

## 2024-03-29 DIAGNOSIS — H26492 Other secondary cataract, left eye: Secondary | ICD-10-CM | POA: Diagnosis not present

## 2024-03-29 DIAGNOSIS — H5089 Other specified strabismus: Secondary | ICD-10-CM | POA: Diagnosis not present

## 2024-03-29 DIAGNOSIS — H43813 Vitreous degeneration, bilateral: Secondary | ICD-10-CM | POA: Diagnosis not present

## 2024-03-29 DIAGNOSIS — Z961 Presence of intraocular lens: Secondary | ICD-10-CM | POA: Diagnosis not present

## 2024-03-29 DIAGNOSIS — H5000 Unspecified esotropia: Secondary | ICD-10-CM | POA: Diagnosis not present

## 2024-03-29 DIAGNOSIS — E119 Type 2 diabetes mellitus without complications: Secondary | ICD-10-CM | POA: Diagnosis not present

## 2024-03-29 DIAGNOSIS — H35451 Secondary pigmentary degeneration, right eye: Secondary | ICD-10-CM | POA: Diagnosis not present
